# Patient Record
Sex: Female | Born: 1955
Health system: Southern US, Community
[De-identification: ages and names within clinical notes are randomized; demographics above are authoritative.]

## PROBLEM LIST (undated history)

## (undated) DIAGNOSIS — I10 Essential (primary) hypertension: Secondary | ICD-10-CM

## (undated) DIAGNOSIS — D509 Iron deficiency anemia, unspecified: Secondary | ICD-10-CM

## (undated) DIAGNOSIS — R011 Cardiac murmur, unspecified: Secondary | ICD-10-CM

## (undated) DIAGNOSIS — I517 Cardiomegaly: Secondary | ICD-10-CM

## (undated) DIAGNOSIS — Z720 Tobacco use: Secondary | ICD-10-CM

## (undated) DIAGNOSIS — N920 Excessive and frequent menstruation with regular cycle: Secondary | ICD-10-CM

## (undated) DIAGNOSIS — E669 Obesity, unspecified: Secondary | ICD-10-CM

## (undated) HISTORY — PX: TUBAL LIGATION: SHX77

## (undated) HISTORY — DX: Tobacco use: Z72.0

## (undated) HISTORY — DX: Essential (primary) hypertension: I10

## (undated) HISTORY — DX: Cardiac murmur, unspecified: R01.1

## (undated) HISTORY — PX: DENTAL SURGERY: SHX609

## (undated) HISTORY — PX: LIPOMA EXCISION: SHX5283

## (undated) HISTORY — PX: APPENDECTOMY: SHX54

## (undated) HISTORY — DX: Obesity, unspecified: E66.9

## (undated) HISTORY — DX: Iron deficiency anemia, unspecified: D50.9

## (undated) HISTORY — DX: Cardiomegaly: I51.7

## (undated) HISTORY — DX: Excessive and frequent menstruation with regular cycle: N92.0

---

## 2004-06-29 ENCOUNTER — Ambulatory Visit (HOSPITAL_COMMUNITY): Admission: RE | Admit: 2004-06-29 | Discharge: 2004-06-29 | Payer: Self-pay | Admitting: Obstetrics and Gynecology

## 2005-06-28 ENCOUNTER — Emergency Department (HOSPITAL_COMMUNITY): Admission: EM | Admit: 2005-06-28 | Discharge: 2005-06-28 | Payer: Self-pay | Admitting: Emergency Medicine

## 2007-08-29 ENCOUNTER — Ambulatory Visit: Payer: Self-pay | Admitting: Family Medicine

## 2007-08-29 DIAGNOSIS — I10 Essential (primary) hypertension: Secondary | ICD-10-CM | POA: Insufficient documentation

## 2007-08-29 DIAGNOSIS — D179 Benign lipomatous neoplasm, unspecified: Secondary | ICD-10-CM | POA: Insufficient documentation

## 2007-08-29 LAB — CONVERTED CEMR LAB
Bilirubin Urine: NEGATIVE
Glucose, Urine, Semiquant: NEGATIVE
Nitrite: NEGATIVE
Protein, U semiquant: NEGATIVE
Specific Gravity, Urine: 1.015
pH: 7.5

## 2007-08-30 ENCOUNTER — Encounter (INDEPENDENT_AMBULATORY_CARE_PROVIDER_SITE_OTHER): Payer: Self-pay | Admitting: Family Medicine

## 2007-09-03 LAB — CONVERTED CEMR LAB
ALT: 9 units/L (ref 0–35)
AST: 13 units/L (ref 0–37)
Albumin: 4.4 g/dL (ref 3.5–5.2)
Alkaline Phosphatase: 62 units/L (ref 39–117)
Basophils Relative: 0 % (ref 0–1)
CO2: 20 meq/L (ref 19–32)
Creatinine, Ser: 0.74 mg/dL (ref 0.40–1.20)
Eosinophils Absolute: 0.1 10*3/uL (ref 0.0–0.7)
HCT: 35.7 % — ABNORMAL LOW (ref 36.0–46.0)
Hemoglobin: 10.8 g/dL — ABNORMAL LOW (ref 12.0–15.0)
LDL Cholesterol: 86 mg/dL (ref 0–99)
Lymphocytes Relative: 23 % (ref 12–46)
Lymphs Abs: 2.1 10*3/uL (ref 0.7–4.0)
MCV: 77.6 fL — ABNORMAL LOW (ref 78.0–100.0)
Monocytes Absolute: 0.9 10*3/uL (ref 0.1–1.0)
Monocytes Relative: 10 % (ref 3–12)
Platelets: 377 10*3/uL (ref 150–400)
RBC: 4.6 M/uL (ref 3.87–5.11)
Total Bilirubin: 0.3 mg/dL (ref 0.3–1.2)
Total Protein: 7.7 g/dL (ref 6.0–8.3)
Triglycerides: 48 mg/dL (ref <150)

## 2007-09-12 ENCOUNTER — Ambulatory Visit: Payer: Self-pay | Admitting: Family Medicine

## 2007-09-12 DIAGNOSIS — D509 Iron deficiency anemia, unspecified: Secondary | ICD-10-CM | POA: Insufficient documentation

## 2007-09-12 DIAGNOSIS — J4520 Mild intermittent asthma, uncomplicated: Secondary | ICD-10-CM | POA: Insufficient documentation

## 2007-09-12 LAB — CONVERTED CEMR LAB
Cholesterol, target level: 200 mg/dL
HDL goal, serum: 40 mg/dL
LDL Goal: 160 mg/dL
OCCULT 1: NEGATIVE

## 2007-09-16 LAB — CONVERTED CEMR LAB
Iron: 37 ug/dL — ABNORMAL LOW (ref 42–145)
Retic Ct Pct: 0.5 % (ref 0.4–3.1)
Saturation Ratios: 9 % — ABNORMAL LOW (ref 20–55)
UIBC: 386 ug/dL

## 2007-10-10 ENCOUNTER — Ambulatory Visit: Payer: Self-pay | Admitting: Family Medicine

## 2007-10-15 ENCOUNTER — Encounter (INDEPENDENT_AMBULATORY_CARE_PROVIDER_SITE_OTHER): Payer: Self-pay | Admitting: Family Medicine

## 2007-11-21 ENCOUNTER — Encounter (INDEPENDENT_AMBULATORY_CARE_PROVIDER_SITE_OTHER): Payer: Self-pay | Admitting: Family Medicine

## 2008-02-10 ENCOUNTER — Telehealth (INDEPENDENT_AMBULATORY_CARE_PROVIDER_SITE_OTHER): Payer: Self-pay | Admitting: *Deleted

## 2008-02-10 ENCOUNTER — Emergency Department (HOSPITAL_COMMUNITY): Admission: EM | Admit: 2008-02-10 | Discharge: 2008-02-10 | Payer: Self-pay | Admitting: Emergency Medicine

## 2008-02-12 ENCOUNTER — Ambulatory Visit: Payer: Self-pay | Admitting: Family Medicine

## 2008-02-13 ENCOUNTER — Encounter (INDEPENDENT_AMBULATORY_CARE_PROVIDER_SITE_OTHER): Payer: Self-pay | Admitting: Family Medicine

## 2008-02-13 ENCOUNTER — Encounter (INDEPENDENT_AMBULATORY_CARE_PROVIDER_SITE_OTHER): Payer: Self-pay | Admitting: *Deleted

## 2008-02-13 LAB — CONVERTED CEMR LAB
BUN: 15 mg/dL (ref 6–23)
Basophils Absolute: 0.1 10*3/uL (ref 0.0–0.1)
CO2: 23 meq/L (ref 19–32)
Calcium: 9.7 mg/dL (ref 8.4–10.5)
Eosinophils Absolute: 1.1 10*3/uL — ABNORMAL HIGH (ref 0.0–0.7)
Glucose, Bld: 72 mg/dL (ref 70–99)
Hemoglobin: 12 g/dL (ref 12.0–15.0)
Lymphocytes Relative: 17 % (ref 12–46)
Lymphs Abs: 2.3 10*3/uL (ref 0.7–4.0)
MCV: 82.8 fL (ref 78.0–100.0)
Monocytes Absolute: 0.9 10*3/uL (ref 0.1–1.0)
Monocytes Relative: 7 % (ref 3–12)
Neutro Abs: 9.3 10*3/uL — ABNORMAL HIGH (ref 1.7–7.7)
Potassium: 3.6 meq/L (ref 3.5–5.3)

## 2008-02-27 ENCOUNTER — Ambulatory Visit: Payer: Self-pay | Admitting: Family Medicine

## 2008-03-02 ENCOUNTER — Encounter (INDEPENDENT_AMBULATORY_CARE_PROVIDER_SITE_OTHER): Payer: Self-pay | Admitting: Family Medicine

## 2008-04-02 ENCOUNTER — Ambulatory Visit: Payer: Self-pay | Admitting: Family Medicine

## 2008-04-02 DIAGNOSIS — K219 Gastro-esophageal reflux disease without esophagitis: Secondary | ICD-10-CM | POA: Insufficient documentation

## 2008-05-07 ENCOUNTER — Ambulatory Visit: Payer: Self-pay | Admitting: Family Medicine

## 2008-05-08 ENCOUNTER — Encounter (INDEPENDENT_AMBULATORY_CARE_PROVIDER_SITE_OTHER): Payer: Self-pay | Admitting: Family Medicine

## 2008-05-10 LAB — CONVERTED CEMR LAB
BUN: 16 mg/dL (ref 6–23)
Basophils Absolute: 0 10*3/uL (ref 0.0–0.1)
Basophils Relative: 0 % (ref 0–1)
Creatinine, Ser: 0.77 mg/dL (ref 0.40–1.20)
Eosinophils Relative: 3 % (ref 0–5)
HCT: 38.8 % (ref 36.0–46.0)
Lymphs Abs: 1.9 10*3/uL (ref 0.7–4.0)
MCHC: 33 g/dL (ref 30.0–36.0)
Monocytes Relative: 11 % (ref 3–12)
Platelets: 341 10*3/uL (ref 150–400)
WBC: 7.1 10*3/uL (ref 4.0–10.5)

## 2008-06-04 ENCOUNTER — Encounter (INDEPENDENT_AMBULATORY_CARE_PROVIDER_SITE_OTHER): Payer: Self-pay | Admitting: Family Medicine

## 2008-06-04 ENCOUNTER — Ambulatory Visit: Payer: Self-pay | Admitting: Family Medicine

## 2008-06-04 ENCOUNTER — Other Ambulatory Visit: Admission: RE | Admit: 2008-06-04 | Discharge: 2008-06-04 | Payer: Self-pay | Admitting: Family Medicine

## 2008-06-04 DIAGNOSIS — F17200 Nicotine dependence, unspecified, uncomplicated: Secondary | ICD-10-CM | POA: Insufficient documentation

## 2008-06-07 ENCOUNTER — Telehealth (INDEPENDENT_AMBULATORY_CARE_PROVIDER_SITE_OTHER): Payer: Self-pay | Admitting: *Deleted

## 2008-06-08 ENCOUNTER — Encounter (INDEPENDENT_AMBULATORY_CARE_PROVIDER_SITE_OTHER): Payer: Self-pay | Admitting: Family Medicine

## 2008-06-08 ENCOUNTER — Encounter (INDEPENDENT_AMBULATORY_CARE_PROVIDER_SITE_OTHER): Payer: Self-pay | Admitting: *Deleted

## 2008-06-17 ENCOUNTER — Ambulatory Visit (HOSPITAL_COMMUNITY): Admission: RE | Admit: 2008-06-17 | Discharge: 2008-06-17 | Payer: Self-pay | Admitting: Family Medicine

## 2008-07-07 ENCOUNTER — Encounter (INDEPENDENT_AMBULATORY_CARE_PROVIDER_SITE_OTHER): Payer: Self-pay | Admitting: Family Medicine

## 2008-07-07 ENCOUNTER — Encounter (INDEPENDENT_AMBULATORY_CARE_PROVIDER_SITE_OTHER): Payer: Self-pay | Admitting: *Deleted

## 2008-07-19 ENCOUNTER — Encounter (INDEPENDENT_AMBULATORY_CARE_PROVIDER_SITE_OTHER): Payer: Self-pay | Admitting: Family Medicine

## 2008-07-30 ENCOUNTER — Ambulatory Visit: Payer: Self-pay | Admitting: Family Medicine

## 2008-09-01 ENCOUNTER — Ambulatory Visit: Payer: Self-pay | Admitting: Family Medicine

## 2008-12-06 ENCOUNTER — Ambulatory Visit: Payer: Self-pay | Admitting: Family Medicine

## 2008-12-07 ENCOUNTER — Encounter (INDEPENDENT_AMBULATORY_CARE_PROVIDER_SITE_OTHER): Payer: Self-pay | Admitting: Family Medicine

## 2008-12-08 LAB — CONVERTED CEMR LAB
Basophils Relative: 0 % (ref 0–1)
HCT: 36.3 % (ref 36.0–46.0)
MCV: 89.9 fL (ref 78.0–100.0)
Neutrophils Relative %: 65 % (ref 43–77)
Platelets: 339 10*3/uL (ref 150–400)
Potassium: 4.7 meq/L (ref 3.5–5.3)
RBC: 4.04 M/uL (ref 3.87–5.11)
RDW: 13.4 % (ref 11.5–15.5)
Sodium: 141 meq/L (ref 135–145)
WBC: 7.6 10*3/uL (ref 4.0–10.5)

## 2009-05-24 ENCOUNTER — Encounter: Payer: Self-pay | Admitting: Physician Assistant

## 2009-05-24 ENCOUNTER — Ambulatory Visit: Payer: Self-pay | Admitting: Family Medicine

## 2009-05-25 ENCOUNTER — Encounter: Payer: Self-pay | Admitting: Physician Assistant

## 2009-05-30 LAB — CONVERTED CEMR LAB
ALT: 10 units/L (ref 0–35)
AST: 11 units/L (ref 0–37)
Alkaline Phosphatase: 53 units/L (ref 39–117)
BUN: 10 mg/dL (ref 6–23)
Creatinine, Ser: 0.6 mg/dL (ref 0.40–1.20)
Glucose, Bld: 87 mg/dL (ref 70–99)
HCT: 36.2 % (ref 36.0–46.0)
Hemoglobin: 11.1 g/dL — ABNORMAL LOW (ref 12.0–15.0)
LDL Cholesterol: 97 mg/dL (ref 0–99)
MCHC: 30.7 g/dL (ref 30.0–36.0)
Potassium: 4.4 meq/L (ref 3.5–5.3)
RBC: 4.12 M/uL (ref 3.87–5.11)
Saturation Ratios: 6 % — ABNORMAL LOW (ref 20–55)
Total Bilirubin: 0.2 mg/dL — ABNORMAL LOW (ref 0.3–1.2)
Triglycerides: 46 mg/dL (ref <150)
Vit D, 25-Hydroxy: 12 ng/mL — ABNORMAL LOW (ref 30–89)
Vitamin B-12: 326 pg/mL (ref 211–911)
WBC: 7.7 10*3/uL (ref 4.0–10.5)

## 2009-05-31 ENCOUNTER — Ambulatory Visit: Payer: Self-pay | Admitting: Family Medicine

## 2009-05-31 DIAGNOSIS — R011 Cardiac murmur, unspecified: Secondary | ICD-10-CM | POA: Insufficient documentation

## 2009-05-31 DIAGNOSIS — N920 Excessive and frequent menstruation with regular cycle: Secondary | ICD-10-CM | POA: Insufficient documentation

## 2009-06-10 ENCOUNTER — Encounter: Payer: Self-pay | Admitting: Family Medicine

## 2009-06-10 ENCOUNTER — Ambulatory Visit (HOSPITAL_COMMUNITY): Admission: RE | Admit: 2009-06-10 | Discharge: 2009-06-10 | Payer: Self-pay | Admitting: Family Medicine

## 2009-06-20 ENCOUNTER — Encounter: Payer: Self-pay | Admitting: Gastroenterology

## 2009-06-20 ENCOUNTER — Ambulatory Visit (HOSPITAL_COMMUNITY): Admission: RE | Admit: 2009-06-20 | Discharge: 2009-06-20 | Payer: Self-pay | Admitting: Family Medicine

## 2009-06-30 ENCOUNTER — Ambulatory Visit: Payer: Self-pay | Admitting: Family Medicine

## 2009-06-30 ENCOUNTER — Other Ambulatory Visit: Admission: RE | Admit: 2009-06-30 | Discharge: 2009-06-30 | Payer: Self-pay | Admitting: Family Medicine

## 2009-06-30 DIAGNOSIS — E559 Vitamin D deficiency, unspecified: Secondary | ICD-10-CM | POA: Insufficient documentation

## 2009-06-30 LAB — FECAL OCCULT BLOOD, GUAIAC: Fecal Occult Blood: NEGATIVE

## 2009-07-04 ENCOUNTER — Ambulatory Visit: Payer: Self-pay | Admitting: Gastroenterology

## 2009-07-04 ENCOUNTER — Ambulatory Visit (HOSPITAL_COMMUNITY): Admission: RE | Admit: 2009-07-04 | Discharge: 2009-07-04 | Payer: Self-pay | Admitting: Gastroenterology

## 2009-07-06 ENCOUNTER — Encounter: Payer: Self-pay | Admitting: Physician Assistant

## 2009-07-06 LAB — CONVERTED CEMR LAB

## 2009-07-22 ENCOUNTER — Ambulatory Visit (HOSPITAL_COMMUNITY): Admission: RE | Admit: 2009-07-22 | Discharge: 2009-07-22 | Payer: Self-pay | Admitting: General Surgery

## 2009-10-06 ENCOUNTER — Ambulatory Visit: Payer: Self-pay | Admitting: Family Medicine

## 2009-10-06 LAB — CONVERTED CEMR LAB: BUN: 19 mg/dL (ref 6–23)

## 2010-03-06 ENCOUNTER — Ambulatory Visit: Payer: Self-pay | Admitting: Family Medicine

## 2010-03-07 ENCOUNTER — Ambulatory Visit: Payer: Self-pay | Admitting: Family Medicine

## 2010-04-03 ENCOUNTER — Encounter: Payer: Self-pay | Admitting: Family Medicine

## 2010-04-16 ENCOUNTER — Encounter: Payer: Self-pay | Admitting: Obstetrics and Gynecology

## 2010-04-25 NOTE — Assessment & Plan Note (Signed)
Summary: new patient- room 2   Vital Signs:  Patient profile:   55 year old female Height:      62 inches Weight:      170.75 pounds BMI:     31.34 O2 Sat:      96 % on Room air Pulse rate:   90 / minute Resp:     16 per minute BP sitting:   240 / 140  (left arm)  Vitals Entered By: Adella Hare LPN (May 24, 1608 8:50 AM) CC: new patient Is Patient Diabetic? No Pain Assessment Patient in pain? no        Primary Provider:  Franchot Heidelberg, MD  CC:  new patient.  History of Present Illness: New pt here to establish new PCP.  Has been out of medications x 3 mos. States that she has been feeling well.  No complaints or concers.  Knows that she should quit smoking.  Pts blood pressure is very high today. She states that even when she was on medication it was never well controlled.  In reviewing her prev records appears that she was not compliant with her medication.  She does not check her BP at home.  Denies HA, dizziness, or chest pain.  She also has a hx of asthma.  States she had a little wheeze in her chest when she awoke this am.  Lasted a few mins then went away.  Did not use her inhaler.  States that she rarely has wheezing or difficulty breathing.    Pt also has a hx of GERD.  States that this is not bothering her.  She has some Zantac at home but hasn't been taking it because she doesn't have any syptoms.  Also has a hx of anemia.  Is not taking her iron.   Asthma History    Asthma Control Assessment:    Age range: 12+ years    Symptoms: 0-2 days/week    Nighttime Awakenings: 0-2/month    Interferes w/ normal activity: no limitations    SABA use (not for EIB): 0-2 days/week    FEV1: 2.17 liters (today)    Exacerbations requiring oral systemic steroids: 0-1/year    Asthma Control Assessment: Well Controlled   Current Medications (verified): 1)  None  Allergies (verified): No Known Drug Allergies  Past History:  Past medical, surgical, family and  social histories (including risk factors) reviewed, and no changes noted (except as noted below).  Past Medical History: Reviewed history from 10/10/2007 and no changes required. Current Problems:  UNSPECIFIED ANEMIA - MICROCYTIC (ICD-285.9) ANEMIA, NORMOCYTIC (ICD-285.9) SHORTNESS OF BREATH (ICD-786.05) TOBACCO USER (ICD-305.1) LIPOMA (ICD-214.9) OBESITY (ICD-278.00) HYPERTENSION (ICD-401.9)  Past Surgical History: Reviewed history from 07/30/2008 and no changes required. 1. Appendectomy - late 110s.  2. WisdomTeeth early 30s. 3. Tubal ligation age 67  Family History: Reviewed history from 07/30/2008 and no changes required. Father: 30, healthy Mother: 52, kidney dz Brothers x 2: Oldest 33, younger 51 - HTN in oldest 3 children: Boys x 2 30,32, and girl age 74 healthy  Social History: Reviewed history from 07/30/2008 and no changes required. 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 Eyes:  Denies blurring and double vision. ENT:  Denies earache, sinus pressure, and sore throat. CV:  Denies chest pain or discomfort, difficulty breathing at night, palpitations, shortness of breath with exertion, and swelling of  feet. Resp:  Complains of wheezing; denies cough and shortness of breath. GI:  Denies indigestion, nausea, and vomiting. Neuro:  Denies headaches, numbness, tingling, tremors, and weakness. Heme:  Denies enlarge lymph nodes.  Physical Exam  General:  Well-developed,well-nourished,in no acute distress; alert,appropriate and cooperative throughout examination Head:  Normocephalic and atraumatic without obvious abnormalities. No apparent alopecia or balding. Eyes:  No corneal or conjunctival inflammation noted. EOMI. Perrla. Funduscopic exam benign, without hemorrhages, exudates or papilledema. Vision  grossly normal. Ears:  External ear exam shows no significant lesions or deformities.  Otoscopic examination reveals clear canals, tympanic membranes are intact bilaterally without bulging, retraction, inflammation or discharge. Hearing is grossly normal bilaterally. Nose:  External nasal examination shows no deformity or inflammation. Nasal mucosa are pink and moist without lesions or exudates. Mouth:  Oral mucosa and oropharynx without lesions or exudates.   Neck:  No deformities, masses, or tenderness noted. Lungs:  Normal respiratory effort, chest expands symmetrically. Lungs are clear to auscultation, no crackles or wheezes. Heart:  Normal rate and regular rhythm. S1 and S2 normal without gallop, murmur, click, rub or other extra sounds. Abdomen:  Bowel sounds positive,abdomen soft and non-tender without masses, organomegaly or hernias noted. Cervical Nodes:  No lymphadenopathy noted Psych:  Cognition and judgment appear intact. Alert and cooperative with normal attention span and concentration. No apparent delusions, illusions, hallucinations   Impression & Recommendations:  Problem # 1:  HYPERTENSION (ICD-401.9) Assessment Deteriorated BP is very high today, & was confirmed by myself. Pt though is asymptomatic, has a hx of elevated BP & medication noncompliance.  The following medications were removed from the medication list:    Lisinopril-hydrochlorothiazide 20-12.5 Mg Tabs (Lisinopril-hydrochlorothiazide) ..... One two times a day    Amlodipine Besylate 10 Mg Tabs (Amlodipine besylate) ..... One daily Her updated medication list for this problem includes:    Lisinopril-hydrochlorothiazide 20-25 Mg Tabs (Lisinopril-hydrochlorothiazide) .Marland Kitchen... 1 q am for high blood pressure    Amlodipine Besylate 10 Mg Tabs (Amlodipine besylate) .Marland Kitchen... 1 daily for high blood pressure  Orders: T-Comprehensive Metabolic Panel [id number]) T-Lipid Profile (09811-91478) T-TSH (29562-13086) EKG w/  Interpretation (93000) - Nl EKG.  See scan.  Problem # 2:  ASTHMA (ICD-493.90) Assessment: Improved  The following medications were removed from the medication list:    Ventolin Hfa 108 (90 Base) Mcg/act Aers (Albuterol sulfate) .Marland Kitchen... 2 puffs every 4 to 6 hours as needed Her updated medication list for this problem includes:    Ventolin Hfa 108 (90 Base) Mcg/act Aers (Albuterol sulfate) .Marland Kitchen... 2 puffs q 4 hrs prn  Problem # 3:  ANEMIA, IRON DEFICIENCY (ICD-280.9)  Orders: T-CBC No Diff ([id number]) T-Iron (28413-24401) T-Iron Binding Capacity (TIBC) ([address]) T-Ferritin (40347-42595) T-Vitamin B12 (63875-64332)  Problem # 4:  TOBACCO USER (ICD-305.1)  Encouraged smoking cessation.   Problem # 5:  GERD (ICD-530.81) Assessment: Improved  The following medications were removed from the medication list:    Zantac 150 Mg Tabs (Ranitidine hcl) ..... One two times a day  Complete Medication List: 1)  Lisinopril-hydrochlorothiazide 20-25 Mg Tabs (Lisinopril-hydrochlorothiazide) .Marland Kitchen.. 1 q am for high blood pressure 2)  Amlodipine Besylate 10 Mg Tabs (Amlodipine besylate) .Marland Kitchen.. 1 daily for high blood pressure 3)  Ventolin Hfa 108 (90 Base) Mcg/act Aers (Albuterol sulfate) .... 2 puffs q 4 hrs prn  Other Orders: T-Vitamin D 25-Hydroxy & 1,25 Dihydroxy (9518)  Patient Instructions: 1)  Follow up appt in one week. 2)  Have lab work drawn  today. 3)  Tobacco is  very bad for your health and your loved ones! You Should stop smoking!. 4)  Start your medications.  Make sure you take your blood pressure medication every day. Prescriptions: VENTOLIN HFA 108 (90 BASE) MCG/ACT AERS (ALBUTEROL SULFATE) 2 puffs q 4 hrs prn  #1 x 1   Entered and Authorized by:   Esperanza Sheets PA   Signed by:   Esperanza Sheets PA on 05/24/2009   Method used:   Electronically to        [facility] [address] [phone]* (retail)       [address] [place], [place]  [address]       Ph: [phone]       Fax:  [phone]   RxID:   [id number] AMLODIPINE BESYLATE 10 MG TABS (AMLODIPINE BESYLATE) 1 daily for high blood pressure  #30 x 1   Entered and Authorized by:   Esperanza Sheets PA   Signed by:   Esperanza Sheets PA on 05/24/2009   Method used:   Electronically to        [facility] [phone]* (retail)       [address] [place], [place]  [address]       Ph: [phone]       Fax: [phone]   RxID:   [id number] LISINOPRIL-HYDROCHLOROTHIAZIDE 20-25 MG TABS (LISINOPRIL-HYDROCHLOROTHIAZIDE) 1 q am for high blood pressure  #30 x 1   Entered and Authorized by:   Esperanza Sheets PA   Signed by:   Esperanza Sheets PA on 05/24/2009   Method used:   Electronically to        [facility] [phone]* (retail)       [address], [place]  [address]       Ph: [phone]       Fax: [phone]   RxID:   [id number]

## 2010-04-25 NOTE — Assessment & Plan Note (Signed)
Summary: follow up--room 1   Vital Signs:  Patient profile:   55 year old female Menstrual status:  regular LMP:     05/30/2009 Height:      62 inches Weight:      165.25 pounds BMI:     30.33 O2 Sat:      97 % Pulse rate:   83 / minute Resp:     16 per minute BP sitting:   144 / 98  (left arm) Cuff size:   regular  Vitals Entered By: Everitt Amber LPN (June 01, 5782 8:10 AM)  Nutrition Counseling: Patient's BMI is greater than 25 and therefore counseled on weight management options.  Serial Vital Signs/Assessments:  Time      Position  BP       Pulse  Resp  Temp     By                     150/92                         Esperanza Sheets PA  CC: Follow up visit LMP (date): 05/30/2009     Menstrual Status regular Enter LMP: 05/30/2009 Last PAP Result NEGATIVE FOR INTRAEPITHELIAL LESIONS OR MALIGNANCY.   Primary Provider:  Esperanza Sheets PA  CC:  Follow up visit.  History of Present Illness: Pt is here today for follow up.  She was seen 1 wk ago with very high BP.  She started her meds & states she is doing well.  No complaints/concerns.  No side effects from meds.  No chest pain, palp, or swelling.  Pt has a hx of asthma.  Had 1 episode of wheezing in the past wk.  Uses albuterol mdi as needed.  Still smoking.  Also has a hx of anemia.  Recent lab work also showed anemia. Pt restarted her iron yest.  Her menses are still regular.  States the 3rd day is heavy.  Wears a pad & tampon & has to change approx q hr.  No previous colonoscopy.  Her last pap/pelvic exam was 1 yr ago.  Pt was also recently rxd Vitamin D.  She will pick up this prescription this week when she gets pd.  Current Medications (verified): 1)  Lisinopril-Hydrochlorothiazide 20-25 Mg Tabs (Lisinopril-Hydrochlorothiazide) .Marland Kitchen.. 1 Q Am For High Blood Pressure 2)  Amlodipine Besylate 10 Mg Tabs (Amlodipine Besylate) .Marland Kitchen.. 1 Daily For High Blood Pressure 3)  Ventolin Hfa 108 (90 Base) Mcg/act Aers (Albuterol  Sulfate) .... 2 Puffs Q 4 Hrs Prn 4)  Vitamin D (Ergocalciferol) 50000 Unit Caps (Ergocalciferol) .... One Tab By Mouth Every Week  Allergies (verified): No Known Drug Allergies  Past History:  Past medical, surgical, family and social histories (including risk factors) reviewed for relevance to current acute and chronic problems.  Past Medical History: Current Problems:  TOBACCO USER (ICD-305.1) LIPOMA (ICD-214.9) OBESITY (ICD-278.00) HYPERTENSION (ICD-401.9) Anemia-iron deficiency Asthma Vitamin D Deficiency. Systolic heart Murmur  Past Surgical History: Reviewed history from 07/30/2008 and no changes required. 1. Appendectomy - late 81s.  2. WisdomTeeth early 30s. 3. Tubal ligation age 59  Family History: Reviewed history from 07/30/2008 and no changes required. Father: 77, healthy Mother: 52, kidney dz Brothers x 2: Oldest 79, younger 20 - HTN in oldest 3 children: Boys x 2 30,32, and girl age 3 healthy  Social History: Reviewed history from 07/30/2008 and no changes required. 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 CV:  Denies chest pain or discomfort, palpitations, and swelling of feet. Resp:  Denies cough and shortness of breath. GI:  Denies bloody stools, change in bowel habits, and dark tarry stools.  Physical Exam  General:  Well-developed,well-nourished,in no acute distress; alert,appropriate and cooperative throughout examination Head:  Normocephalic and atraumatic without obvious abnormalities. No apparent alopecia or balding. Ears:  External ear exam shows no significant lesions or deformities.  Otoscopic examination reveals clear canals, tympanic membranes are intact bilaterally without bulging, retraction, inflammation or discharge. Hearing is grossly normal bilaterally. Nose:   External nasal examination shows no deformity or inflammation. Nasal mucosa are pink and moist without lesions or exudates. Mouth:  Oral mucosa and oropharynx without lesions or exudates.  Teeth in good repair - upper plate. Neck:  No deformities, masses, or tenderness noted. Lungs:  Normal respiratory effort, chest expands symmetrically. Lungs are clear to auscultation, no crackles or wheezes. Heart:  normal rate, regular rhythm, and grade 2 /6 systolic murmur.   Cervical Nodes:  No lymphadenopathy noted Psych:  Cognition and judgment appear intact. Alert and cooperative with normal attention span and concentration. No apparent delusions, illusions, hallucinations   Impression & Recommendations:  Problem # 1:  HYPERTENSION (ICD-401.9) Assessment Improved Pt started prescription 1 wk ago.  Will continue at current dose & recheck again at next visit.  Her updated medication list for this problem includes:    Lisinopril-hydrochlorothiazide 20-25 Mg Tabs (Lisinopril-hydrochlorothiazide) .Marland Kitchen... 1 q am for high blood pressure    Amlodipine Besylate 10 Mg Tabs (Amlodipine besylate) .Marland Kitchen... 1 daily for high blood pressure  Orders: 2 D Echo (2 D Echo)  Problem # 2:  HEART MURMUR, SYSTOLIC (ICD-785.2) Assessment: New  Orders: 2 D Echo (2 D Echo)  Problem # 3:  ANEMIA, IRON DEFICIENCY (ICD-280.9) Assessment: Unchanged  Orders: Gastroenterology Referral (GI)  Problem # 4:  MENORRHAGIA (ICD-626.2) Assessment: Unchanged  Orders: Pelvic Ultrasound (Sono Pelvis)  Problem # 5:  TOBACCO USER (ICD-305.1) Assessment: Unchanged  Encouraged smoking cessation    Complete Medication List: 1)  Lisinopril-hydrochlorothiazide 20-25 Mg Tabs (Lisinopril-hydrochlorothiazide) .Marland Kitchen.. 1 q am for high blood pressure 2)  Amlodipine Besylate 10 Mg Tabs (Amlodipine besylate) .Marland Kitchen.. 1 daily for high blood pressure 3)  Ventolin Hfa 108 (90 Base) Mcg/act Aers (Albuterol sulfate) .... 2 puffs q 4 hrs prn 4)   Vitamin D (ergocalciferol) 50000 Unit Caps (Ergocalciferol) .... One tab by mouth every week  Other Orders: Mammogram (Screening) (Mammo)  Patient Instructions: 1)  Please schedule a follow-up appointment in 1 month for pap/breast exam. 2)  Tobacco is very bad for your health and your loved ones! You Should stop smoking!. 3)  Stop Smoking Tips: Choose a Quit date. Cut down before the Quit date. decide what you will do as a substitute when you feel the urge to smoke(gum,toothpick,exercise). 4)  It is important that you exercise regularly at least 20 minutes 5 times a week. If you develop chest pain, have severe difficulty breathing, or feel very tired , stop exercising immediately and seek medical attention. 5)  You need to lose weight. Consider a lower calorie diet and regular exercise.

## 2010-04-25 NOTE — Letter (Signed)
Summary: Laboratory/X-Ray Results  [facility]  [address]   [place], [place] [address]   Phone: [phone]  Fax: [phone]    Lab/X-Ray Results  July 06, 2009  MRN: [id number]  Paula Best [address] [place], [place]  [address]    The results of your recent lab/x-ray has been reviewed and were found:       Your pap smear is normal.      Physical is due in 1 year.  We will see you for follow up in 3 months though as planned.   If you have any questions, please contact our office.     Esperanza Sheets PA

## 2010-04-25 NOTE — Assessment & Plan Note (Signed)
Summary: office visit ROOM-2   Vital Signs:  Patient profile:   55 year old female Menstrual status:  regular Height:      62 inches Weight:      166.25 pounds BMI:     30.52 O2 Sat:      98 % Pulse rate:   76 / minute Resp:     16 per minute BP sitting:   132 / 90  (left arm) Cuff size:   regular  Vitals Entered By: Everitt Amber LPN (October 06, 2009 9:54 AM)  Serial Vital Signs/Assessments:  Time      Position  BP       Pulse  Resp  Temp     By                     124/70                         Esperanza Sheets PA  CC: Follow up, HTN   Primary Provider:  Esperanza Sheets PA  CC:  Follow up and HTN.  History of Present Illness: Pt is here today for a check up. Hx of htn. Taking medications as prescribed. No headache, chest pain or palpitations.  Also has a hx of asthma.  States the only time she has problems with her breathing is when there is smoke at work.  No HS awakening.  No difficulty breathing with exertion or at home.  No cough.  Pt states she did lose her last inhaler though and hasnt picked up a refill.  Pt is still smoking.  She is not interested in quitting at this time. Hx of anemia.  She has not started taking her iron pills as advised.    Current Medications (verified): 1)  Amlodipine Besylate 10 Mg Tabs (Amlodipine Besylate) .Marland Kitchen.. 1 Daily For High Blood Pressure 2)  Ventolin Hfa 108 (90 Base) Mcg/act Aers (Albuterol Sulfate) .... 2 Puffs Q 4 Hrs Prn 3)  Lisinopril-Hydrochlorothiazide 20-12.5 Mg Tabs (Lisinopril-Hydrochlorothiazide) .... Take 2 Tabs Every Morning For Blood Pressure  Allergies (verified): 1)  ! * Vitamin D 50,000 Units  Past History:  Past medical history reviewed for relevance to current acute and chronic problems.  Past Medical History: Reviewed history from 06/30/2009 and no changes required. Current Problems:  TOBACCO USER (ICD-305.1) LIPOMA (ICD-214.9) OBESITY (ICD-278.00) HYPERTENSION (ICD-401.9) Anemia-iron  deficiency Asthma Vitamin D Deficiency. Systolic heart Murmur Mild LVH - Echo 3/11 Menorrhagia  Review of Systems General:  Denies chills and fever. ENT:  Denies earache, nasal congestion, and sore throat. CV:  Denies chest pain or discomfort, lightheadness, and palpitations. Resp:  Denies cough and shortness of breath. Neuro:  Denies headaches.  Physical Exam  General:  Well-developed,well-nourished,in no acute distress; alert,appropriate and cooperative throughout examination Head:  Normocephalic and atraumatic without obvious abnormalities. No apparent alopecia or balding. Ears:  External ear exam shows no significant lesions or deformities.  Otoscopic examination reveals clear canals, tympanic membranes are intact bilaterally without bulging, retraction, inflammation or discharge. Hearing is grossly normal bilaterally. Nose:  External nasal examination shows no deformity or inflammation. Nasal mucosa are pink and moist without lesions or exudates. Mouth:  Oral mucosa and oropharynx without lesions or exudates.   Neck:  No deformities, masses, or tenderness noted. Lungs:  Normal respiratory effort, chest expands symmetrically. Lungs are clear to auscultation, no crackles or wheezes. Heart:  Normal rate and regular rhythm. S1 and S2 normal without gallop, murmur,  click, rub or other extra sounds. Cervical Nodes:  No lymphadenopathy noted Psych:  Cognition and judgment appear intact. Alert and cooperative with normal attention span and concentration. No apparent delusions, illusions, hallucinations   Impression & Recommendations:  Problem # 1:  HYPERTENSION (ICD-401.9) Assessment Improved  Her updated medication list for this problem includes:    Amlodipine Besylate 10 Mg Tabs (Amlodipine besylate) .Marland Kitchen... 1 daily for high blood pressure    Lisinopril-hydrochlorothiazide 20-12.5 Mg Tabs (Lisinopril-hydrochlorothiazide) .Marland Kitchen... Take 2 tabs every morning for blood  pressure  Orders: T-Basic Metabolic Panel [id number]  BP today: 132/90 Prior BP: 150/90 (06/30/2009)  Prior 10 Yr Risk Heart Disease: 8 % (12/06/2008)  Labs Reviewed: K+: 4.4 (05/24/2009) Creat: : 0.60 (05/24/2009)   Chol: 172 (05/24/2009)   HDL: 66 (05/24/2009)   LDL: 97 (05/24/2009)   TG: 46 (05/24/2009)  Problem # 2:  ASTHMA (ICD-493.90) Assessment: Improved  Her updated medication list for this problem includes:    Ventolin Hfa 108 (90 Base) Mcg/act Aers (Albuterol sulfate) .Marland Kitchen... 2 puffs q 4 hrs prn  Problem # 3:  ANEMIA-IRON DEFICIENCY (ICD-280.9) Assessment: Unchanged Pt encouraged to take her iron. Will need f/u labs in future after taking iron regularly.  Problem # 4:  TOBACCO USER (ICD-305.1) Assessment: Unchanged  Encouraged smoking cessation and discussed different methods for smoking cessation.   Complete Medication List: 1)  Amlodipine Besylate 10 Mg Tabs (Amlodipine besylate) .Marland Kitchen.. 1 daily for high blood pressure 2)  Ventolin Hfa 108 (90 Base) Mcg/act Aers (Albuterol sulfate) .... 2 puffs q 4 hrs prn 3)  Lisinopril-hydrochlorothiazide 20-12.5 Mg Tabs (Lisinopril-hydrochlorothiazide) .... Take 2 tabs every morning for blood pressure  Patient Instructions: 1)  Please schedule a follow-up appointment in 3 months. 2)  Continue your current medications. 3)  I have ordered blood work for you to have drawn. 4)  Tobacco is very bad for your health and your loved ones! You Should stop smoking!. 5)  Stop Smoking Tips: Choose a Quit date. Cut down before the Quit date. decide what you will do as a substitute when you feel the urge to smoke(gum,toothpick,exercise). Prescriptions: VENTOLIN HFA 108 (90 BASE) MCG/ACT AERS (ALBUTEROL SULFATE) 2 puffs q 4 hrs prn  #1 x 2   Entered and Authorized by:   Esperanza Sheets PA   Signed by:   Esperanza Sheets PA on 10/06/2009   Method used:   Electronically to        [facility] [address] [phone]* (retail)       [address] [place], [place]  [address]       Ph: [phone]       Fax: [phone]   RxID:   [id number] LISINOPRIL-HYDROCHLOROTHIAZIDE 20-12.5 MG TABS (LISINOPRIL-HYDROCHLOROTHIAZIDE) take 2 tabs every morning for blood pressure  #60 x 3   Entered and Authorized by:   Esperanza Sheets PA   Signed by:   Esperanza Sheets PA on 10/06/2009   Method used:   Electronically to        [facility] [phone]* (retail)       [address] [place], [place]  [address]       Ph: [phone]       Fax: [phone]   RxID:   [id number] AMLODIPINE BESYLATE 10 MG TABS (AMLODIPINE BESYLATE) 1 daily for high blood pressure  #30 Each x 3   Entered and Authorized by:   Esperanza Sheets PA  Signed by:   Esperanza Sheets PA on 10/06/2009   Method used:   Electronically to        [facility] [phone]* (retail)       [address], [place]  [address]       Ph: [phone]       Fax: [phone]   RxID:   [id number]

## 2010-04-25 NOTE — Letter (Signed)
Summary: Internal Other/Triage  Internal Other/Triage   Imported By: Cloria Spring LPN 95/62/1308 65:78:46  _____________________________________________________________________  External Attachment:    Type:   Image     Comment:   External Document

## 2010-04-25 NOTE — Assessment & Plan Note (Signed)
Summary: physical - room 3   Vital Signs:  Patient profile:   55 year old female Menstrual status:  regular Height:      62 inches Weight:      165 pounds BMI:     30.29 O2 Sat:      98 % on Room air Pulse rate:   89 / minute Resp:     16 per minute BP sitting:   150 / 90  (left arm)  Vitals Entered By: Adella Hare LPN (June 30, 1608 10:18 AM)  Nutrition Counseling: Patient's BMI is greater than 25 and therefore counseled on weight management options. CC: physical Is Patient Diabetic? No Pain Assessment Patient in pain? no        Primary Provider:  Esperanza Sheets PA  CC:  physical.  History of Present Illness: Pt is here today for her physical. Mammogram UTD - nl Menses regular.  Usually heavy, but hasn't been this mos.  Heavy, with clots, first 3 days. Has to change protection (pad & Tampon) sometimes less than 1 hr. Colonoscopy schedulled for next wk. No prev eye exam. + Dental care Uncertain Tetnus.  Pneumovax UTD.  Doesnt take iron regularly. Takes her BP meds daily. Hasn't picked up Vit D prescription yet.  Discussed recent test results, including mamm, pelvic US, & echo.  Explained LVH to pt & importance of BP control to prevent worsening.   Asthma History    Initial Asthma Severity Rating:    Age range: 12+ years    Symptoms: 0-2 days/week    Nighttime Awakenings: 0-2/month    Interferes w/ normal activity: no limitations    SABA use (not for EIB): 0-2 days/week    Exacerbations requiring oral systemic steroids: 0-1/year    Asthma Severity Assessment: Intermittent   Current Medications (verified): 1)  Lisinopril-Hydrochlorothiazide 20-25 Mg Tabs (Lisinopril-Hydrochlorothiazide) .Marland Kitchen.. 1 Q Am For High Blood Pressure 2)  Amlodipine Besylate 10 Mg Tabs (Amlodipine Besylate) .Marland Kitchen.. 1 Daily For High Blood Pressure 3)  Ventolin Hfa 108 (90 Base) Mcg/act Aers (Albuterol Sulfate) .... 2 Puffs Q 4 Hrs Prn 4)  Vitamin D (Ergocalciferol) 50000 Unit Caps  (Ergocalciferol) .... One Tab By Mouth Every Week  Allergies (verified): No Known Drug Allergies  Past History:  Past medical, surgical, family and social histories (including risk factors) reviewed, and no changes noted (except as noted below).  Past Medical History: Current Problems:  TOBACCO USER (ICD-305.1) LIPOMA (ICD-214.9) OBESITY (ICD-278.00) HYPERTENSION (ICD-401.9) Anemia-iron deficiency Asthma Vitamin D Deficiency. Systolic heart Murmur Mild LVH - Echo 3/11 Menorrhagia  Past Surgical History: Reviewed history from 07/30/2008 and no changes required. 1. Appendectomy - late 25s.  2. WisdomTeeth early 30s. 3. Tubal ligation age 82  Family History: Reviewed history from 07/30/2008 and no changes required. Father: 63, healthy Mother: 26, kidney dz Brothers x 2: Oldest 11, younger 78 - HTN in oldest 3 children: Boys x 2 30,32, and girl age 61 healthy  Social History: Reviewed history from 07/30/2008 and no changes required. 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 Eyes:  Complains of blurring and double vision. ENT:  Denies decreased hearing, earache, nasal congestion, ringing in ears, and sore throat. CV:  Denies chest pain or discomfort and palpitations. Resp:  Complains of wheezing; denies cough and shortness of breath. GI:  Complains of indigestion; denies abdominal pain, bloody stools, change in bowel  habits, constipation, diarrhea, nausea, and vomiting; INDIGESTION WITH CERTAIN FOODS ONLY. GU:  Denies abnormal vaginal bleeding, dysuria, incontinence, and urinary frequency. Derm:  Complains of lesion(s); denies rash. Psych:  Denies anxiety and depression. Allergy:  Denies seasonal allergies.  Physical Exam  General:  Well-developed,well-nourished,in no acute distress; alert,appropriate and  cooperative throughout examination Head:  Normocephalic and atraumatic without obvious abnormalities. No apparent alopecia or balding. Eyes:  No corneal or conjunctival inflammation noted. EOMI. Perrla. Funduscopic exam benign, without hemorrhages, exudates or papilledema. Vision grossly normal. Ears:  External ear exam shows no significant lesions or deformities.  Otoscopic examination reveals clear canals, tympanic membranes are intact bilaterally without bulging, retraction, inflammation or discharge. Hearing is grossly normal bilaterally. Nose:  External nasal examination shows no deformity or inflammation. Nasal mucosa are pink and moist without lesions or exudates. Mouth:  Oral mucosa and oropharynx without lesions or exudates.  Teeth in good repair. Neck:  No deformities, masses, or tenderness noted.no thyromegaly.   Chest Wall:  no deformities and no mass.   Breasts:  No mass, nodules, thickening, tenderness, bulging, retraction, inflamation, nipple discharge or skin changes noted.   Lungs:  Normal respiratory effort, chest expands symmetrically. Lungs are clear to auscultation, no crackles or wheezes. Heart:  Normal rate and regular rhythm. S1 and S2 normal without gallop, murmur, click, rub or other extra sounds. Abdomen:  soft, non-tender, normal bowel sounds, no hepatomegaly, and no splenomegaly.   Rectal:  No external abnormalities noted. Normal sphincter tone. No rectal masses or tenderness. Genitalia:  Normal introitus for age, no external lesions, no vaginal discharge, mucosa pink and moist, no vaginal or cervical lesions, no vaginal atrophy, no friaility or hemorrhage, normal uterus size and position, no adnexal masses or tenderness Extremities:  No clubbing, cyanosis, edema, or deformity noted with normal full range of motion of all joints.   Neurologic:  alert & oriented X3, sensation intact to light touch, and gait normal.   Skin:  Intact without suspicious lesions or rashes Pt  has lg lipoma mid thoracic back Cervical Nodes:  No lymphadenopathy noted Axillary Nodes:  No palpable lymphadenopathy Psych:  Cognition and judgment appear intact. Alert and cooperative with normal attention span and concentration. No apparent delusions, illusions, hallucinations   Impression & Recommendations:  Problem # 1:  Preventive Health Care (ICD-V70.0) Encouraged periodic SBE. Tdap given today. Encourage pt to sched routine eye exam  Problem # 2:  HYPERTENSION (ICD-401.9) Assessment: Unchanged  The following medications were removed from the medication list:    Lisinopril-hydrochlorothiazide 20-25 Mg Tabs (Lisinopril-hydrochlorothiazide) .Marland Kitchen... 1 q am for high blood pressure Her updated medication list for this problem includes:    Amlodipine Besylate 10 Mg Tabs (Amlodipine besylate) .Marland Kitchen... 1 daily for high blood pressure    Lisinopril-hydrochlorothiazide 20-12.5 Mg Tabs (Lisinopril-hydrochlorothiazide) .Marland Kitchen... Take 2 tabs every morning for blood pressure  BP today: 150/90 Prior BP: 144/98 (05/31/2009)  Prior 10 Yr Risk Heart Disease: 8 % (12/06/2008)  Labs Reviewed: K+: 4.4 (05/24/2009) Creat: : 0.60 (05/24/2009)   Chol: 172 (05/24/2009)   HDL: 66 (05/24/2009)   LDL: 97 (05/24/2009)   TG: 46 (05/24/2009)  Problem # 3:  MENORRHAGIA (ICD-626.2) Assessment: Unchanged Discussed Gyn referral for heavy menses.  Discussed possible ablation or Mirena IUD for treatment.  Also discussed with pt that this should improve/resolve once she goes thru menopause.  Problem # 4:  LIPOMA (ICD-214.9) Assessment: Unchanged  Discussed surgical referral with pt since this is bothersome to her.  Orders: Surgical Referral (  Surgery)  Problem # 5:  ANEMIA-IRON DEFICIENCY (ICD-280.9)  Encouraged pt to restart Fe+ after her colonoscopy next week.  Orders: T-CBC No Diff ([id number]) T-Iron ([id number])  Problem # 6:  VITAMIN D DEFICIENCY (ICD-268.9)  Encouraged pt to pick up  prescription for Vit D & start taking as prescribed.  Orders: T-Vitamin D (25-Hydroxy) [phone])  Complete Medication List: 1)  Amlodipine Besylate 10 Mg Tabs (Amlodipine besylate) .Marland Kitchen.. 1 daily for high blood pressure 2)  Ventolin Hfa 108 (90 Base) Mcg/act Aers (Albuterol sulfate) .... 2 puffs q 4 hrs prn 3)  Vitamin D (ergocalciferol) 50000 Unit Caps (Ergocalciferol) .... One tab by mouth every week 4)  Lisinopril-hydrochlorothiazide 20-12.5 Mg Tabs (Lisinopril-hydrochlorothiazide) .... Take 2 tabs every morning for blood pressure  Other Orders: Hemoccult Guaiac-1 spec.(in office) (82270) Pap Smear (34742) Tdap => 87yrs IM (59563) Admin 1st Vaccine (87564)  Patient Instructions: 1)  Please schedule a follow-up appointment in 3 months. 2)  Tobacco is very bad for your health and your loved ones! You Should stop smoking!. 3)  Stop Smoking Tips: Choose a Quit date. Cut down before the Quit date. decide what you will do as a substitute when you feel the urge to smoke(gum,toothpick,exercise). 4)  It is important that you exercise regularly at least 20 minutes 5 times a week. If you develop chest pain, have severe difficulty breathing, or feel very tired , stop exercising immediately and seek medical attention. 5)  You need to lose weight. Consider a lower calorie diet and regular exercise.  6)  I have changed your Lisinopril dosage.  Discontinue taking the previous ones & start the new prescription. 7)  Schedule an eye exam. 8)  I have referred you to a surgeon to see about having your Lipoma removed. 9)  You have received a Tetnus vaccine today.  Your next one is due in 10 yrs. Prescriptions: LISINOPRIL-HYDROCHLOROTHIAZIDE 20-12.5 MG TABS (LISINOPRIL-HYDROCHLOROTHIAZIDE) take 2 tabs every morning for blood pressure  #60 x 3   Entered and Authorized by:   Esperanza Sheets PA   Signed by:   Esperanza Sheets PA on 06/30/2009   Method used:   Electronically to        [facility]  [phone]* (retail)       [address] [place]  [address]       Ph: [phone]       Fax: [phone]   RxID:   [id number]    Immunizations Administered:  Tetanus Vaccine:    Vaccine Type: Tdap    Site: right deltoid    Mfr: GlaxoSmithKline    Dose: 0.5 ml    Route: IM    Given by: Adella Hare LPN    Exp. Date: 06/18/2011    Lot #: [id number]    VIS given: 02/11/07 version given June 30, 2009.   Preventive Care Screening  Last Tetanus Booster:    Date:  06/30/2009    Results:  Tdap  Hemoccult:    Date:  06/30/2009    Next Due:  06/2010    Results:  negative   Laboratory Results    Stool - Occult Blood Hemmoccult #1: negative Date: 06/30/2009    Preventive Care Screening  Last Tetanus Booster:    Date:  06/30/2009    Results:  Tdap  Hemoccult:    Date:  06/30/2009    Next Due:  06/2010    Results:  negative   Prevention & Chronic  Care Immunizations   Influenza vaccine: Advised via HD  (02/27/2008)   Influenza vaccine due: 11/24/2009    Tetanus booster: 06/30/2009: Tdap   Tetanus booster due: 07/01/2019    Pneumococcal vaccine: Pneumovax  (02/27/2008)   Pneumococcal vaccine due: 03/07/2021  Colorectal Screening   Hemoccult: negative  (06/30/2009)   Hemoccult due: 06/2010    Colonoscopy: Refered but did not go  (07/30/2008)   Colonoscopy due: 10/2008  Other Screening   Pap smear: NEGATIVE FOR INTRAEPITHELIAL LESIONS OR MALIGNANCY.  (06/04/2008)   Pap smear due: 05/2009    Mammogram: ASSESSMENT: Negative - BI-RADS 1^MM DIGITAL SCREENING  (06/20/2009)   Mammogram due: 06/2009   Smoking status: current  (07/30/2008)   Smoking cessation counseling: yes  (06/30/2009)  Lipids   Total Cholesterol: 172  (05/24/2009)   LDL: 97  (05/24/2009)   LDL Direct: Not documented   HDL: 66  (05/24/2009)   Triglycerides: 46  (05/24/2009)  Hypertension   Last Blood Pressure: 150 / 90  (06/30/2009)   Serum creatinine: 0.60   (05/24/2009)   Serum potassium 4.4  (05/24/2009)  Self-Management Support :    Hypertension self-management support: Not documented

## 2010-04-27 NOTE — Miscellaneous (Signed)
Summary: refill  Clinical Lists Changes  Medications: Rx of AMLODIPINE BESYLATE 10 MG TABS (AMLODIPINE BESYLATE) 1 daily for high blood pressure;  #30 Tablet x 4;  Signed;  Entered by: Everitt Amber LPN;  Authorized by: Syliva Overman MD;  Method used: Electronically to [facility] [address] [phone]*, [address] [place], [place]  [address], Ph: [phone], Fax: [phone]    Prescriptions: AMLODIPINE BESYLATE 10 MG TABS (AMLODIPINE BESYLATE) 1 daily for high blood pressure  #30 Tablet x 4   Entered by:   Everitt Amber LPN   Authorized by:   Syliva Overman MD   Signed by:   Everitt Amber LPN on 57/84/6962   Method used:   Electronically to        [facility] [address] [phone]* (retail)       [address] [place]  [address]       Ph: [phone]       Fax: [phone]   RxID:   [id number]

## 2010-04-27 NOTE — Letter (Signed)
Summary: 1ST MISSED LETTER  1ST MISSED LETTER   Imported By: Lind Guest 03/07/2010 09:57:42  _____________________________________________________________________  External Attachment:    Type:   Image     Comment:   External Document

## 2010-04-27 NOTE — Assessment & Plan Note (Signed)
Summary: meds   Vital Signs:  Patient profile:   55 year old female Menstrual status:  regular LMP:     03/07/2010 Height:      62 inches Weight:      167.75 pounds BMI:     30.79 O2 Sat:      98 % on Room air Pulse rate:   94 / minute Pulse rhythm:   regular Resp:     16 per minute BP sitting:   130 / 82  (left arm)  Vitals Entered By: Adella Hare LPN (March 07, 2010 2:57 PM)  Nutrition Counseling: Patient's BMI is greater than 25 and therefore counseled on weight management options.  O2 Flow:  Room air CC: follow-up visit Is Patient Diabetic? No Pain Assessment Patient in pain? no      Comments did not bring meds to ov LMP (date): 03/07/2010     Enter LMP: 03/07/2010 Last PAP Result ENDOMETRIAL CELLS ARE PRESENT. CLINICAL CORRELATION AND CORRELATION WITH THE LMP IS RECOMMENDED.   Primary Care Avynn Klassen:  Syliva Overman MD  CC:  follow-up visit.  History of Present Illness: Reports  that she has been  doing well. Denies recent fever or chills. Denies sinus pressure, nasal congestion , ear pain or sore throat. Denies chest congestion, or cough productive of sputum. Denies chest pain, palpitations, PND, orthopnea or leg swelling. Denies abdominal pain, nausea, vomitting, diarrhea or constipation. Denies change in bowel movements or bloody stool. Denies dysuria , frequency, incontinence or hesitancy. Denies  joint pain, swelling, or reduced mobility. Denies headaches, vertigo, seizures. Denies depression, anxiety or insomnia. Denies  rash, lesions, or itch.     Allergies (verified): 1)  ! * Vitamin D 50,000 Units  Review of Systems      See HPI Eyes:  Denies blurring and discharge. Endo:  Denies cold intolerance and excessive hunger. Heme:  Denies abnormal bruising and bleeding. Allergy:  Denies hives or rash and itching eyes.  Physical Exam  General:  Well-developed,well-nourished,in no acute distress; alert,appropriate and cooperative  throughout examination HEENT: No facial asymmetry,  EOMI, No sinus tenderness, TM's Clear, oropharynx  pink and moist.   Chest: Clear to auscultation bilaterally.  CVS: S1, S2, No murmurs, No S3.   Abd: Soft, Nontender.  MS: Adequate ROM spine, hips, shoulders and knees.  Ext: No edema.   CNS: CN 2-12 intact, power tone and sensation normal throughout.   Skin: Intact, no visible lesions or rashes.  Psych: Good eye contact, normal affect.  Memory intact, not anxious or depressed appearing.    Impression & Recommendations:  Problem # 1:  HYPERTENSION (ICD-401.9) Assessment Improved  The following medications were removed from the medication list:    Lisinopril-hydrochlorothiazide 20-12.5 Mg Tabs (Lisinopril-hydrochlorothiazide) .Marland Kitchen... Take 2 tabs every morning for blood pressure Her updated medication list for this problem includes:    Amlodipine Besylate 10 Mg Tabs (Amlodipine besylate) .Marland Kitchen... 1 daily for high blood pressure  BP today: 130/82 Prior BP: 132/90 (10/06/2009)  Prior 10 Yr Risk Heart Disease: 8 % (12/06/2008)  Labs Reviewed: K+: 3.7 (10/06/2009) Creat: : 0.80 (10/06/2009)   Chol: 172 (05/24/2009)   HDL: 66 (05/24/2009)   LDL: 97 (05/24/2009)   TG: 46 (05/24/2009)  Problem # 2:  OBESITY (ICD-278.00) Assessment: Unchanged  Ht: 62 (03/07/2010)   Wt: 167.75 (03/07/2010)   BMI: 30.79 (03/07/2010) therapeutic lifestyle change discussed and encouraged  Problem # 3:  TOBACCO USER (ICD-305.1) Assessment: Unchanged  Encouraged smoking cessation and discussed different methods for  smoking cessation.   Problem # 4:  ASTHMA (ICD-493.90) Assessment: Unchanged  Her updated medication list for this problem includes:    Ventolin Hfa 108 (90 Base) Mcg/act Aers (Albuterol sulfate) .Marland Kitchen... 2 puffs q 4 hrs prn  Complete Medication List: 1)  Amlodipine Besylate 10 Mg Tabs (Amlodipine besylate) .Marland Kitchen.. 1 daily for high blood pressure 2)  Ventolin Hfa 108 (90 Base) Mcg/act Aers  (Albuterol sulfate) .... 2 puffs q 4 hrs prn 3)  Oscal 500/200 D-3 500-200 Mg-unit Tabs (Calcium carbonate-vitamin d) .... Take 1 tablet by mouth three times a day 4)  Multivitamin  .... One daily  Patient Instructions: 1)  CPE in 4 months.+ 2)  It is important that you exercise regularly at least 20 minutes 5 times a week. If you develop chest pain, have severe difficulty breathing, or feel very tired , stop exercising immediately and seek medical attention. 3)  You need to lose weight. Consider a lower calorie diet and regular exercise.  Prescriptions: OSCAL 500/200 D-3 500-200 MG-UNIT TABS (CALCIUM CARBONATE-VITAMIN D) Take 1 tablet by mouth three times a day  #90 x 11   Entered and Authorized by:   Syliva Overman MD   Signed by:   Syliva Overman MD on 03/07/2010   Method used:   Electronically to        [facility] [address] [phone]* (retail)       [address] [place], [place]  [address]       Ph: [phone]       Fax: [phone]   RxID:   [id number]    Orders Added: 1)  Est. Patient Level IV [44010]

## 2010-05-16 ENCOUNTER — Telehealth: Payer: Self-pay | Admitting: Family Medicine

## 2010-05-23 NOTE — Progress Notes (Signed)
Summary: inhaler  Phone Note Call from Patient   Summary of Call: needs her inhaler send to [facility] [place] call back at [phone] to let her know Initial call taken by: Lind Guest,  May 16, 2010 4:35 PM    Prescriptions: VENTOLIN HFA 108 (90 BASE) MCG/ACT AERS (ALBUTEROL SULFATE) 2 puffs q 4 hrs prn  #1 x 2   Entered by:   Everitt Amber LPN   Authorized by:   Syliva Overman MD   Signed by:   Everitt Amber LPN on 04/54/0981   Method used:   Electronically to        [facility] [address] [phone]* (retail)       [address], [place]  [address]       Ph: [phone]       Fax: [phone]   RxID:   [id number]

## 2010-06-13 LAB — CBC
Hemoglobin: 12.6 g/dL (ref 12.0–15.0)
MCHC: 34.6 g/dL (ref 30.0–36.0)
MCV: 84.5 fL (ref 78.0–100.0)
Platelets: 340 10*3/uL (ref 150–400)
WBC: 10.1 10*3/uL (ref 4.0–10.5)

## 2010-06-13 LAB — BASIC METABOLIC PANEL
CO2: 28 mEq/L (ref 19–32)
Chloride: 104 mEq/L (ref 96–112)
Creatinine, Ser: 0.7 mg/dL (ref 0.4–1.2)
Potassium: 3.4 mEq/L — ABNORMAL LOW (ref 3.5–5.1)
Sodium: 138 mEq/L (ref 135–145)

## 2010-07-17 ENCOUNTER — Encounter: Payer: Self-pay | Admitting: Family Medicine

## 2010-07-18 ENCOUNTER — Telehealth: Payer: Self-pay | Admitting: Family Medicine

## 2010-07-18 ENCOUNTER — Encounter: Payer: Self-pay | Admitting: Family Medicine

## 2010-07-18 ENCOUNTER — Ambulatory Visit (INDEPENDENT_AMBULATORY_CARE_PROVIDER_SITE_OTHER): Payer: BC Managed Care – PPO | Admitting: Family Medicine

## 2010-07-18 ENCOUNTER — Other Ambulatory Visit: Payer: Self-pay | Admitting: Family Medicine

## 2010-07-18 VITALS — BP 152/90 | HR 81 | Resp 16 | Ht 62.0 in | Wt 164.1 lb

## 2010-07-18 DIAGNOSIS — M719 Bursopathy, unspecified: Secondary | ICD-10-CM

## 2010-07-18 DIAGNOSIS — R5381 Other malaise: Secondary | ICD-10-CM

## 2010-07-18 DIAGNOSIS — Z139 Encounter for screening, unspecified: Secondary | ICD-10-CM

## 2010-07-18 DIAGNOSIS — J45909 Unspecified asthma, uncomplicated: Secondary | ICD-10-CM

## 2010-07-18 DIAGNOSIS — Z79899 Other long term (current) drug therapy: Secondary | ICD-10-CM

## 2010-07-18 DIAGNOSIS — R5383 Other fatigue: Secondary | ICD-10-CM

## 2010-07-18 DIAGNOSIS — M778 Other enthesopathies, not elsewhere classified: Secondary | ICD-10-CM

## 2010-07-18 DIAGNOSIS — M67919 Unspecified disorder of synovium and tendon, unspecified shoulder: Secondary | ICD-10-CM

## 2010-07-18 DIAGNOSIS — F17200 Nicotine dependence, unspecified, uncomplicated: Secondary | ICD-10-CM

## 2010-07-18 DIAGNOSIS — Z1239 Encounter for other screening for malignant neoplasm of breast: Secondary | ICD-10-CM

## 2010-07-18 DIAGNOSIS — I10 Essential (primary) hypertension: Secondary | ICD-10-CM

## 2010-07-18 DIAGNOSIS — E669 Obesity, unspecified: Secondary | ICD-10-CM

## 2010-07-18 MED ORDER — BENAZEPRIL-HYDROCHLOROTHIAZIDE 20-25 MG PO TABS: 1.0000 | ORAL_TABLET | Freq: Every day | ORAL | Status: DC

## 2010-07-18 MED ORDER — KETOROLAC TROMETHAMINE 30 MG/ML IJ SOLN
60.0000 mg | Freq: Once | INTRAMUSCULAR | Status: AC
  Administered 2010-07-18: 60 mg via INTRAMUSCULAR

## 2010-07-18 MED ORDER — METHYLPREDNISOLONE ACETATE 80 MG/ML IJ SUSP
80.0000 mg | Freq: Once | INTRAMUSCULAR | Status: AC
  Administered 2010-07-18: 80 mg via INTRAMUSCULAR

## 2010-07-18 MED ORDER — PREDNISONE (PAK) 5 MG PO TABS: 5.0000 mg | ORAL_TABLET | ORAL | Status: AC

## 2010-07-18 NOTE — Progress Notes (Signed)
  Subjective:    Patient ID: Paula Best, female    DOB: 12-24-1955, 55 y.o.   MRN: [id number]  HPI 3 month h/o left upper arm pain as though it will lock. Does a lot of upper body movement on the job. Smokes 1 PPD , unwilling to quit now. She is here for f/u chronic problems . Cancer screen and immunization are updated  Review of Systems Denies recent fever or chills. Denies sinus pressure, nasal congestion, ear pain or sore throat. Denies chest congestion, productive cough or wheezing.No asthma flare for months. Denies chest pains, palpitations, paroxysmal nocturnal dyspnea, orthopnea and leg swelling Denies abdominal pain, nausea, vomiting,diarrhea or constipation.  Denies rectal bleeding or change in bowel movement. Denies dysuria, frequency, hesitancy or incontinence. Denies headaches, seizure, numbness, or tingling. Denies depression, anxiety or insomnia. Denies skin break down or rash.        Objective:   Physical Exam Patient alert and oriented and in no Cardiopulmonary distress.  HEENT: No facial asymmetry, EOMI, no sinus tenderness, TM's clear, Oropharynx pink and moist.  Neck supple no adenopathy.  Chest: Clear to auscultation bilaterally.No wheezes or crackles  CVS: S1, S2 , murmur, no S3.  ABD: Soft non tender. Bowel sounds normal.  Ext: No edema  MS: Adequate ROM spine,  hips and knees.Reduced in left shoulder.  Skin: Intact, no ulcerations or rash noted.  Psych: Good eye contact, normal affect. Memory intact not anxious or depressed appearing.  CNS: CN 2-12 intact, power, tone and sensation normal throughout.        Assessment & Plan:

## 2010-07-18 NOTE — Telephone Encounter (Signed)
thanks

## 2010-07-18 NOTE — Patient Instructions (Addendum)
CPE in 3 months.  Your blood pressure  Is too high continue amlodipine 1 daily, and add  Another taablet which i am prescribing  Mammogram will be scheduled  CBC , fasting chem 7 , lipid and TSH  In 6 to 7 weeks  It is important that you exercise regularly at least 30 minutes 5 times a week. If you develop chest pain, have severe difficulty breathing, or feel very tired, stop exercising immediately and seek medical attention  A healthy diet is rich in fruit, vegetables and whole grains. Poultry fish, nuts and beans are a healthy choice for protein rather then red meat. A low sodium diet and drinking 64 ounces of water daily is generally recommended. Oils and sweet should be limited. Carbohydrates especially for those who are diabetic or overweight, should be limited to 34-45 gram per meal. It is important to eat on a regular schedule, at least 3 times daily. Snacks should be primarily fruits, vegetables or nuts.   Your bones need calcium 1200mg   With vit d 1000IU once daily, this is otc as a soft gel  You will get injections today for left arm pain and medication is also sent in  Please think about quitting smoking.  This is very important for your health.  Consider setting a quit date, then cutting back or switching brands to prepare to stop.  Also think of the money you will save every day by not smoking.  Quick Tips to Quit Smoking: Fix a date i.e. keep a date in mind from when you would not touch a tobacco product to smoke  Keep yourself busy and block your mind with work loads or reading books or watching movies in malls where smoking is not allowed  Vanish off the things which reminds you about smoking for example match box, or your favorite lighter, or the pipe you used for smoking, or your favorite jeans and shirt with which you used to enjoy smoking, or the club where you used to do smoking  Try to avoid certain people places and incidences where and with whom smoking is a common  factor to add on  Praise yourself with some token gifts from the money you saved by stopping smoking  Anti Smoking teams are there to help you. Join their programs  Anti-smoking Gums are there in many medical shops. Try them to quit smoking   Side-effects of Smoking: Disease caused by smoking cigarettes are emphysema, bronchitis, heart failures  Premature death  Cancer is the major side effect of smoking  Heart attacks and strokes are the quick effects of smoking causing sudden death  Some smokers lives end up with limbs amputated  Breathing problem or fast breathing is another side effect of smoking  Due to more intakes of smokes, carbon mono-oxide goes into your brain and other muscles of the body which leads to swelling of the veins and blockage to the air passage to lungs  Carbon monoxide blocks blood vessels which leads to blockage in the flow of blood to different major body organs like heart lungs and thus leads to attacks and deaths  During pregnancy smoking is very harmful and leads to premature birth of the infant, spontaneous abortions, low weight of the infant during birth  Fat depositions to narrow and blocked blood vessels causing heart attacks  In many cases cigarette smoking caused infertility in men

## 2010-07-24 ENCOUNTER — Ambulatory Visit (HOSPITAL_COMMUNITY)
Admission: RE | Admit: 2010-07-24 | Discharge: 2010-07-24 | Disposition: A | Discharge status: Home / Self Care | Payer: BC Managed Care – PPO | Source: Ambulatory Visit | Attending: Family Medicine | Admitting: Family Medicine

## 2010-07-24 DIAGNOSIS — Z1239 Encounter for other screening for malignant neoplasm of breast: Secondary | ICD-10-CM

## 2010-07-24 DIAGNOSIS — Z1231 Encounter for screening mammogram for malignant neoplasm of breast: Secondary | ICD-10-CM | POA: Insufficient documentation

## 2010-07-26 ENCOUNTER — Other Ambulatory Visit: Payer: Self-pay | Admitting: Family Medicine

## 2010-07-26 DIAGNOSIS — R928 Other abnormal and inconclusive findings on diagnostic imaging of breast: Secondary | ICD-10-CM

## 2010-07-30 ENCOUNTER — Encounter: Payer: Self-pay | Admitting: Family Medicine

## 2010-07-30 NOTE — Assessment & Plan Note (Signed)
Controlled, no change in medication  

## 2010-07-30 NOTE — Assessment & Plan Note (Signed)
Hypertension:Controlled, no changes in medication.   

## 2010-07-30 NOTE — Assessment & Plan Note (Signed)
Unchanged, counseled to quit 

## 2010-07-30 NOTE — Assessment & Plan Note (Signed)
unchanged Patient re-educated about  the importance of commitment to a  minimum of 150 minutes of exercise per week. The importance of healthy food choices with portion control discussed. Encouraged to start a food diary, count calories and to consider  joining a support group. Sample diet sheets offered. Goals set by the patient for the next several months.    

## 2010-10-17 ENCOUNTER — Encounter: Payer: Self-pay | Admitting: Family Medicine

## 2010-10-19 ENCOUNTER — Ambulatory Visit (INDEPENDENT_AMBULATORY_CARE_PROVIDER_SITE_OTHER): Payer: BC Managed Care – PPO | Admitting: Family Medicine

## 2010-10-19 ENCOUNTER — Other Ambulatory Visit (HOSPITAL_COMMUNITY)
Admission: RE | Admit: 2010-10-19 | Discharge: 2010-10-19 | Disposition: A | Discharge status: Home / Self Care | Payer: BC Managed Care – PPO | Source: Ambulatory Visit | Attending: Family Medicine | Admitting: Family Medicine

## 2010-10-19 ENCOUNTER — Encounter: Payer: Self-pay | Admitting: Family Medicine

## 2010-10-19 VITALS — BP 142/90 | HR 86 | Resp 16 | Ht 61.75 in | Wt 161.0 lb

## 2010-10-19 DIAGNOSIS — Z13220 Encounter for screening for lipoid disorders: Secondary | ICD-10-CM

## 2010-10-19 DIAGNOSIS — Z1211 Encounter for screening for malignant neoplasm of colon: Secondary | ICD-10-CM

## 2010-10-19 DIAGNOSIS — I10 Essential (primary) hypertension: Secondary | ICD-10-CM

## 2010-10-19 DIAGNOSIS — Z01419 Encounter for gynecological examination (general) (routine) without abnormal findings: Secondary | ICD-10-CM | POA: Insufficient documentation

## 2010-10-19 DIAGNOSIS — Z124 Encounter for screening for malignant neoplasm of cervix: Secondary | ICD-10-CM

## 2010-10-19 DIAGNOSIS — J45909 Unspecified asthma, uncomplicated: Secondary | ICD-10-CM

## 2010-10-19 DIAGNOSIS — F17200 Nicotine dependence, unspecified, uncomplicated: Secondary | ICD-10-CM

## 2010-10-19 DIAGNOSIS — Z0000 Encounter for general adult medical examination without abnormal findings: Secondary | ICD-10-CM

## 2010-10-19 DIAGNOSIS — E559 Vitamin D deficiency, unspecified: Secondary | ICD-10-CM

## 2010-10-19 MED ORDER — VITAMIN D3 1.25 MG (50000 UT) PO CAPS: 50000.0000 [IU] | ORAL_CAPSULE | ORAL | Status: DC

## 2010-10-19 MED ORDER — VITAMIN D (ERGOCALCIFEROL) 1.25 MG (50000 UNIT) PO CAPS: 50000.0000 [IU] | ORAL_CAPSULE | ORAL | Status: DC

## 2010-10-19 MED ORDER — HYDROCHLOROTHIAZIDE 12.5 MG PO CAPS: 12.5000 mg | ORAL_CAPSULE | Freq: Every day | ORAL | Status: DC

## 2010-10-19 NOTE — Patient Instructions (Addendum)
F/u in 3 months.  Fasting chem 7, lipid and vit D  In 3 months.   Please think about quitting smoking.  This is very important for your health.  Consider setting a quit date, then cutting back or switching brands to prepare to stop.  Also think of the money you will save every day by not smoking.  Quick Tips to Quit Smoking: Fix a date i.e. keep a date in mind from when you would not touch a tobacco product to smoke  Keep yourself busy and block your mind with work loads or reading books or watching movies in malls where smoking is not allowed  Vanish off the things which reminds you about smoking for example match box, or your favorite lighter, or the pipe you used for smoking, or your favorite jeans and shirt with which you used to enjoy smoking, or the club where you used to do smoking  Try to avoid certain people places and incidences where and with whom smoking is a common factor to add on  Praise yourself with some token gifts from the money you saved by stopping smoking  Anti Smoking teams are there to help you. Join their programs  Anti-smoking Gums are there in many medical shops. Try them to quit smoking   Side-effects of Smoking: Disease caused by smoking cigarettes are emphysema, bronchitis, heart failures  Premature death  Cancer is the major side effect of smoking  Heart attacks and strokes are the quick effects of smoking causing sudden death  Some smokers lives end up with limbs amputated  Breathing problem or fast breathing is another side effect of smoking  Due to more intakes of smokes, carbon mono-oxide goes into your brain and other muscles of the body which leads to swelling of the veins and blockage to the air passage to lungs  Carbon monoxide blocks blood vessels which leads to blockage in the flow of blood to different major body organs like heart lungs and thus leads to attacks and deaths  During pregnancy smoking is very harmful and leads to premature birth of the  infant, spontaneous abortions, low weight of the infant during birth  Fat depositions to narrow and blocked blood vessels causing heart attacks  In many cases cigarette smoking caused infertility in men    Your blood pressure is high, you need an additional tablet, this has been sent in.  Your vit D is low , you need to take once weekly capsules for the next 6 months, this is sent to your pharmacy

## 2010-10-19 NOTE — Assessment & Plan Note (Signed)
Unchanged, encouraged to quit, unwilling to set quit date at this time

## 2010-10-19 NOTE — Assessment & Plan Note (Signed)
Controlled, no change in medication  

## 2010-10-19 NOTE — Progress Notes (Signed)
  Subjective:    Patient ID: Paula Best, female    DOB: [birth year], 55 y.o.   MRN: [id number]  HPI The PT is here for annual exam and re-evaluation of chronic medical conditions, medication management and review of recent lab and radiology data.  Preventive health is updated, specifically  Cancer screening,and Immunization.   Questions or concerns regarding consultations or procedures which the PT has had in the interim are  addressed. The PT denies any adverse reactions to current medications since the last visit.  There are no new concerns.  There are no specific complaints       Review of Systems Denies recent fever or chills. Denies sinus pressure, nasal congestion, ear pain or sore throat. Denies chest congestion, productive cough or wheezing. Denies chest pains, palpitations, paroxysmal nocturnal dyspnea, orthopnea and leg swelling Denies abdominal pain, nausea, vomiting,diarrhea or constipation.  Denies rectal bleeding or change in bowel movement. Denies dysuria, frequency, hesitancy or incontinence. Denies joint pain, swelling and limitation in mobility. Denies headaches, seizure, numbness, or tingling. Denies depression, anxiety or insomnia. Denies skin break down or rash.        Objective:   Physical Exam Pleasant well nourished female, alert and oriented x 3, in no cardio-pulmonary distress. Afebrile. HEENT No facial trauma or asymetry.Sinus non tender. EOMI, PERTL, fundoscopic exam is normal, no hemorhage or exudate. External ears normal, tympanic membranes clear. Oropharynx moist, no exudate, good dentition. Neck: supple, no adenopathy,JVD or thyromegaly.No bruits.  Chest: Clear to ascultation bilaterally.No crackles or wheezes. Non tender to palpation. Reduced air entry bilaterally.  Breast: No asymetry,no masses. No nipple discharge or inversion. No axillary or supraclavicular adenopathy  Cardiovascular system; Heart sounds normal,  S1 and  S2  ,no S3.  No murmur, or thrill. Apical beat not displaced Peripheral pulses normal.  Abdomen: Soft, non tender, no organomegaly or masses. No bruits. Bowel sounds normal. No guarding, tenderness or rebound.  Rectal:  No mass. guaic negative stool.  GU: External genitalia normal. No lesions. Vaginal canal normal.No discharge. Uterus normal size, no adnexal masses, no cervical motion or adnexal tenderness.  Musculoskeletal exam: Full ROM of spine, hips , shoulders and knees. No deformity ,swelling or crepitus noted. No muscle wasting or atrophy.   Neurologic: Cranial nerves 2 to 12 intact. Power, tone ,sensation and reflexes normal throughout. No disturbance in gait. No tremor.  Skin: Intact, no ulceration, erythema , scaling or rash noted. Pigmentation normal throughout  Psych; Normal mood and affect. Judgement and concentration normal        Assessment & Plan:   HYPERTENSION Medication compliance addressed. Commitment to regular exercise and healthy  food choices, with portion control discussed. DASH diet and low fat diet discussed and literature offered. Changes in medication made at this visit.   TOBACCO USER Unchanged, encouraged to quit, unwilling to set quit date at this time  ASTHMA Controlled, no change in medication

## 2010-10-19 NOTE — Assessment & Plan Note (Signed)
Medication compliance addressed. Commitment to regular exercise and healthy  food choices, with portion control discussed. DASH diet and low fat diet discussed and literature offered. Changes in medication made at this visit.  

## 2010-10-24 ENCOUNTER — Encounter: Payer: Self-pay | Admitting: *Deleted

## 2011-01-02 ENCOUNTER — Other Ambulatory Visit: Payer: Self-pay | Admitting: Family Medicine

## 2011-01-23 ENCOUNTER — Encounter: Payer: Self-pay | Admitting: Family Medicine

## 2011-01-25 ENCOUNTER — Ambulatory Visit (INDEPENDENT_AMBULATORY_CARE_PROVIDER_SITE_OTHER): Payer: BC Managed Care – PPO | Admitting: Family Medicine

## 2011-01-25 ENCOUNTER — Encounter: Payer: Self-pay | Admitting: Family Medicine

## 2011-01-25 VITALS — BP 150/100 | HR 80 | Resp 16 | Ht 62.0 in | Wt 159.0 lb

## 2011-01-25 DIAGNOSIS — M949 Disorder of cartilage, unspecified: Secondary | ICD-10-CM

## 2011-01-25 DIAGNOSIS — F17200 Nicotine dependence, unspecified, uncomplicated: Secondary | ICD-10-CM

## 2011-01-25 DIAGNOSIS — J45909 Unspecified asthma, uncomplicated: Secondary | ICD-10-CM

## 2011-01-25 DIAGNOSIS — R5381 Other malaise: Secondary | ICD-10-CM

## 2011-01-25 DIAGNOSIS — E669 Obesity, unspecified: Secondary | ICD-10-CM

## 2011-01-25 DIAGNOSIS — R5383 Other fatigue: Secondary | ICD-10-CM

## 2011-01-25 DIAGNOSIS — I10 Essential (primary) hypertension: Secondary | ICD-10-CM

## 2011-01-25 DIAGNOSIS — R7301 Impaired fasting glucose: Secondary | ICD-10-CM

## 2011-01-25 DIAGNOSIS — Z13220 Encounter for screening for lipoid disorders: Secondary | ICD-10-CM

## 2011-01-25 DIAGNOSIS — M899 Disorder of bone, unspecified: Secondary | ICD-10-CM

## 2011-01-25 MED ORDER — AMLODIPINE BESYLATE 10 MG PO TABS: 10.0000 mg | ORAL_TABLET | Freq: Every day | ORAL | Status: DC

## 2011-01-25 NOTE — Patient Instructions (Addendum)
F/U in December.  Your blood pressure is too high, you need to take both medications.  Fasting labs NEED to be done.   It is important that you exercise regularly at least 30 minutes 5 times a week. If you develop chest pain, have severe difficulty breathing, or feel very tired, stop exercising.  A healthy diet is rich in fruit, vegetables and whole grains. Poultry fish, nuts and beans are a healthy choice for protein rather then red meat. A low sodium diet and drinking 64 ounces of water daily is generally recommended. Oils and sweet should be limited. Carbohydrates especially for those who are diabetic or overweight, should be limited to 34-45 gram per meal. It is important to eat on a regular schedule, at least 3 times daily. Snacks should be primarily fruits, vegetables or nuts.   pls consider the flu vaccine , you need one  Congrats on weight loss, keep it up  Pls start 1 vitamin tab daily, also calcium with D 1200mg  /1000IU one daily for bone health

## 2011-01-25 NOTE — Progress Notes (Signed)
  Subjective:    Patient ID: Paula Best, female    DOB: 12-31-1955, 55 y.o.   MRN: [id number]  HPI The PT is here for follow up and re-evaluation of chronic medical conditions, medication management and review of any available recent lab and radiology data.  Preventive health is updated, specifically  Cancer screening and Immunization.   Questions or concerns regarding consultations or procedures which the PT has had in the interim are  addressed. The PT denies any adverse reactions to current medications since the last visit.  There are no new concerns.  There are no specific complaints       Review of Systems See HPI Denies recent fever or chills. Denies sinus pressure, nasal congestion, ear pain or sore throat. Denies chest congestion, productive cough or wheezing. Denies chest pains, palpitations and leg swelling Denies abdominal pain, nausea, vomiting,diarrhea or constipation.   Denies dysuria, frequency, hesitancy or incontinence. Denies joint pain, swelling and limitation in mobility. Denies headaches, seizures, numbness, or tingling. Denies depression, anxiety or insomnia. Denies skin break down or rash.        Objective:   Physical Exam   Patient alert and oriented and in no cardiopulmonary distress.  HEENT: No facial asymmetry, EOMI, no sinus tenderness,  oropharynx pink and moist.  Neck supple no adenopathy.  Chest: Clear to auscultation bilaterally.  CVS: S1, S2 no murmurs, no S3.  ABD: Soft non tender. Bowel sounds normal.  Ext: No edema  MS: Adequate ROM spine, shoulders, hips and knees.  Skin: Intact, no ulcerations or rash noted.  Psych: Good eye contact, normal affect. Memory intact not anxious or depressed appearing.  CNS: CN 2-12 intact, power, tone and sensation normal throughout.      Assessment & Plan:

## 2011-01-27 NOTE — Assessment & Plan Note (Signed)
Uncontrolled due to medical non compliance , pt re educated about the need to comply with prescription med

## 2011-01-27 NOTE — Assessment & Plan Note (Deleted)
Deteriorated. Patient re-educated about  the importance of commitment to a  minimum of 150 minutes of exercise per week. The importance of healthy food choices with portion control discussed. Encouraged to start a food diary, count calories and to consider  joining a support group. Sample diet sheets offered. Goals set by the patient for the next several months.    

## 2011-01-27 NOTE — Assessment & Plan Note (Signed)
Stable and controlled, no recent asthma flares

## 2011-01-27 NOTE — Assessment & Plan Note (Signed)
Unchanged, counselled to quit 

## 2011-02-24 LAB — CBC WITH DIFFERENTIAL/PLATELET
Basophils Relative: 0 % (ref 0–1)
Eosinophils Absolute: 0.1 10*3/uL (ref 0.0–0.7)
HCT: 37.1 % (ref 36.0–46.0)
Hemoglobin: 11.8 g/dL — ABNORMAL LOW (ref 12.0–15.0)
Lymphs Abs: 1.6 10*3/uL (ref 0.7–4.0)
MCH: 27.3 pg (ref 26.0–34.0)
MCHC: 31.8 g/dL (ref 30.0–36.0)
Monocytes Absolute: 0.6 10*3/uL (ref 0.1–1.0)
Monocytes Relative: 10 % (ref 3–12)
Neutro Abs: 3.6 10*3/uL (ref 1.7–7.7)
Neutrophils Relative %: 60 % (ref 43–77)
RBC: 4.32 MIL/uL (ref 3.87–5.11)

## 2011-02-24 LAB — LIPID PANEL
Cholesterol: 165 mg/dL (ref 0–200)
LDL Cholesterol: 99 mg/dL (ref 0–99)
Total CHOL/HDL Ratio: 2.8 Ratio
Triglycerides: 28 mg/dL (ref <150)
VLDL: 6 mg/dL (ref 0–40)

## 2011-02-24 LAB — HEMOGLOBIN A1C
Hgb A1c MFr Bld: 6.1 % — ABNORMAL HIGH (ref <5.7)
Mean Plasma Glucose: 128 mg/dL — ABNORMAL HIGH (ref <117)

## 2011-02-24 LAB — HEPATIC FUNCTION PANEL
ALT: 8 U/L (ref 0–35)
Total Protein: 6.7 g/dL (ref 6.0–8.3)

## 2011-02-24 LAB — TSH: TSH: 0.807 u[IU]/mL (ref 0.350–4.500)

## 2011-03-01 ENCOUNTER — Encounter: Payer: Self-pay | Admitting: Family Medicine

## 2011-03-06 ENCOUNTER — Encounter: Payer: Self-pay | Admitting: Family Medicine

## 2011-03-06 ENCOUNTER — Ambulatory Visit (INDEPENDENT_AMBULATORY_CARE_PROVIDER_SITE_OTHER): Payer: BC Managed Care – PPO | Admitting: Family Medicine

## 2011-03-06 VITALS — BP 132/78 | HR 94 | Resp 18 | Ht 62.0 in | Wt 161.1 lb

## 2011-03-06 DIAGNOSIS — R7309 Other abnormal glucose: Secondary | ICD-10-CM

## 2011-03-06 DIAGNOSIS — R7303 Prediabetes: Secondary | ICD-10-CM

## 2011-03-06 DIAGNOSIS — F17200 Nicotine dependence, unspecified, uncomplicated: Secondary | ICD-10-CM

## 2011-03-06 DIAGNOSIS — E559 Vitamin D deficiency, unspecified: Secondary | ICD-10-CM

## 2011-03-06 DIAGNOSIS — I10 Essential (primary) hypertension: Secondary | ICD-10-CM

## 2011-03-06 DIAGNOSIS — J45909 Unspecified asthma, uncomplicated: Secondary | ICD-10-CM

## 2011-03-06 MED ORDER — VITAMIN D (ERGOCALCIFEROL) 1.25 MG (50000 UNIT) PO CAPS: 50000.0000 [IU] | ORAL_CAPSULE | ORAL | Status: DC

## 2011-03-06 NOTE — Patient Instructions (Addendum)
F/u mid April.  Blood pressure is excellent.  Blood sugar is too high, you need to reduce carbs and sweets and I recommend you attend group session on diabetic diet  I recommend the flu vaccine  Fasting chem 7 , HBa1C  And Vit D in April before visit.  Please take vit D as prescribed for the next 6 months , you will be given a script  Pls reconsider, you should get a flu vaccine

## 2011-03-06 NOTE — Progress Notes (Signed)
  Subjective:    Patient ID: Paula Best, female    DOB: 03-21-1956, 55 y.o.   MRN: [id number]  HPI The PT is here for follow up and re-evaluation of chronic medical conditions, medication management and review of any available recent lab and radiology data.  Preventive health is updated, specifically  Cancer screening and Immunization.   Questions or concerns regarding consultations or procedures which the PT has had in the interim are  addressed. The PT denies any adverse reactions to current medications since the last visit.  There are no new concerns.  There are no specific complaints       Review of Systems See HPI Denies recent fever or chills. Denies sinus pressure, nasal congestion, ear pain or sore throat. Denies chest congestion, productive cough or wheezing. Denies chest pains, palpitations and leg swelling Denies abdominal pain, nausea, vomiting,diarrhea or constipation.   Denies dysuria, frequency, hesitancy or incontinence. Denies joint pain, swelling and limitation in mobility. Denies headaches, seizures, numbness, or tingling. Denies depression, anxiety or insomnia. Denies skin break down or rash.        Objective:   Physical Exam Patient alert and oriented and in no cardiopulmonary distress.  HEENT: No facial asymmetry, EOMI, no sinus tenderness,  oropharynx pink and moist.  Neck supple no adenopathy.  Chest: Clear to auscultation bilaterally.Decreased air entry bilaterally  CVS: S1, S2 no murmurs, no S3.  ABD: Soft non tender. Bowel sounds normal.  Ext: No edema  MS: Adequate ROM spine, shoulders, hips and knees.  Skin: Intact, no ulcerations or rash noted.  Psych: Good eye contact, normal affect. Memory intact not anxious or depressed appearing.  CNS: CN 2-12 intact, power, tone and sensation normal throughout.`       Assessment & Plan:

## 2011-03-07 NOTE — Assessment & Plan Note (Signed)
Controlled and stable at this time 

## 2011-03-07 NOTE — Assessment & Plan Note (Signed)
Pt has not been taking med prescribed, importance of correcting the def explained and a script given

## 2011-03-07 NOTE — Assessment & Plan Note (Signed)
Cessation counseling done, no commitment to quit at this time

## 2011-03-07 NOTE — Assessment & Plan Note (Signed)
Controlled, no change in medication  

## 2011-05-22 ENCOUNTER — Encounter: Payer: Self-pay | Admitting: Family Medicine

## 2011-05-22 ENCOUNTER — Ambulatory Visit (INDEPENDENT_AMBULATORY_CARE_PROVIDER_SITE_OTHER): Payer: BC Managed Care – PPO | Admitting: Family Medicine

## 2011-05-22 VITALS — BP 150/100 | HR 90 | Resp 16 | Ht 62.0 in | Wt 164.1 lb

## 2011-05-22 DIAGNOSIS — R0789 Other chest pain: Secondary | ICD-10-CM | POA: Insufficient documentation

## 2011-05-22 DIAGNOSIS — I10 Essential (primary) hypertension: Secondary | ICD-10-CM

## 2011-05-22 DIAGNOSIS — R0989 Other specified symptoms and signs involving the circulatory and respiratory systems: Secondary | ICD-10-CM | POA: Insufficient documentation

## 2011-05-22 DIAGNOSIS — R209 Unspecified disturbances of skin sensation: Secondary | ICD-10-CM

## 2011-05-22 DIAGNOSIS — R200 Anesthesia of skin: Secondary | ICD-10-CM

## 2011-05-22 DIAGNOSIS — F17200 Nicotine dependence, unspecified, uncomplicated: Secondary | ICD-10-CM

## 2011-05-22 DIAGNOSIS — J45909 Unspecified asthma, uncomplicated: Secondary | ICD-10-CM

## 2011-05-22 DIAGNOSIS — E559 Vitamin D deficiency, unspecified: Secondary | ICD-10-CM

## 2011-05-22 MED ORDER — HYDROCHLOROTHIAZIDE 25 MG PO TABS: 25.0000 mg | ORAL_TABLET | Freq: Every day | ORAL | Status: DC

## 2011-05-22 MED ORDER — POTASSIUM CHLORIDE ER 10 MEQ PO TBCR: 10.0000 meq | EXTENDED_RELEASE_TABLET | Freq: Two times a day (BID) | ORAL | Status: DC

## 2011-05-22 NOTE — Patient Instructions (Addendum)
F/u as before.  Blood pressure is high, INCREASE DOSE hctz to 25mg  ONE daily, OK to take 12.5 mg Tabs tWO daily , till done.  Labs before next visit as before.\  You are referred for an [place] of your neck to ensure no blockage present .  EKG is normal.  You need to stop smoking.  You need to take one baby aspirin once daily to reduce your risk of stroke.  You will be given info on warning signs of a stroke, if this happens go to the Ed. Your neurologic exam is normal at this visit

## 2011-05-22 NOTE — Assessment & Plan Note (Addendum)
Uncontrolled , additional med to be started 

## 2011-05-22 NOTE — Progress Notes (Signed)
  Subjective:    Patient ID: Paula Best, female    DOB: 1955/08/12, 56 y.o.   MRN: [id number]  HPI Pt states she woke this morning and experienced numbness in her feet and hands, about 3 minutes. No weakness.This has occurred in the past also No blurred vision or difficulty with speech, has had mild headache and continues to smoke. Denies any recent fever or chills   Review of Systems See HPI Denies recent fever or chills. Denies sinus pressure, nasal congestion, ear pain or sore throat. Denies chest congestion, productive cough or wheezing. Denies chest pains, palpitations and leg swelling Denies abdominal pain, nausea, vomiting,diarrhea or constipation.   Denies dysuria, frequency, hesitancy or incontinence. Denies joint pain, swelling and limitation in mobility. Denies  seizures, or weakness Denies depression, anxiety or insomnia. Denies skin break down or rash.        Objective:   Physical Exam  Patient alert and oriented and in no cardiopulmonary distress.  HEENT: No facial asymmetry, EOMI, no sinus tenderness,  oropharynx pink and moist.  Neck supple no adenopathy.Bruit Chest: Clear to auscultation bilaterally.Decreased air entry  CVS: S1, S2 no murmurs, no S3.  ABD: Soft non tender. Bowel sounds normal.  Ext: No edema  MS: Adequate ROM spine, shoulders, hips and knees.  Skin: Intact, no ulcerations or rash noted.  Psych: Good eye contact, normal affect. Memory intact not anxious or depressed appearing.  CNS: CN 2-12 intact, power, tone and sensation normal throughout.       Assessment & Plan:

## 2011-05-24 ENCOUNTER — Ambulatory Visit (HOSPITAL_COMMUNITY): Payer: BC Managed Care – PPO

## 2011-05-26 DIAGNOSIS — R200 Anesthesia of skin: Secondary | ICD-10-CM | POA: Insufficient documentation

## 2011-05-26 NOTE — Assessment & Plan Note (Signed)
Currently stable, no recent flares 

## 2011-05-26 NOTE — Assessment & Plan Note (Signed)
ekg in office NSR, no ischemic changes,

## 2011-05-26 NOTE — Assessment & Plan Note (Signed)
Cessation counseling done, no quit date set 

## 2011-05-26 NOTE — Assessment & Plan Note (Signed)
Importance of correction with weekly supplement discussed

## 2011-05-26 NOTE — Assessment & Plan Note (Signed)
Brief symptom which had entirely resolved at visit. Importance of smoking cessation, blood pressure control and low fat diet stressed, to reduce stroke risk

## 2011-05-26 NOTE — Assessment & Plan Note (Signed)
Will refer for doppler study

## 2011-06-25 ENCOUNTER — Encounter: Payer: Self-pay | Admitting: Family Medicine

## 2011-07-02 ENCOUNTER — Other Ambulatory Visit: Payer: Self-pay | Admitting: Family Medicine

## 2011-07-02 DIAGNOSIS — IMO0002 Reserved for concepts with insufficient information to code with codable children: Secondary | ICD-10-CM

## 2011-07-02 DIAGNOSIS — Z139 Encounter for screening, unspecified: Secondary | ICD-10-CM

## 2011-07-09 ENCOUNTER — Ambulatory Visit (HOSPITAL_COMMUNITY): Payer: BC Managed Care – PPO

## 2011-07-18 ENCOUNTER — Ambulatory Visit: Payer: BC Managed Care – PPO | Admitting: Family Medicine

## 2011-07-23 ENCOUNTER — Other Ambulatory Visit: Payer: Self-pay | Admitting: Family Medicine

## 2011-07-25 ENCOUNTER — Ambulatory Visit (HOSPITAL_COMMUNITY)
Admission: RE | Admit: 2011-07-25 | Discharge: 2011-07-25 | Disposition: A | Discharge status: Home / Self Care | Payer: BC Managed Care – PPO | Source: Ambulatory Visit | Attending: Family Medicine | Admitting: Family Medicine

## 2011-07-25 DIAGNOSIS — N63 Unspecified lump in unspecified breast: Secondary | ICD-10-CM | POA: Insufficient documentation

## 2011-07-25 DIAGNOSIS — IMO0002 Reserved for concepts with insufficient information to code with codable children: Secondary | ICD-10-CM

## 2011-08-28 ENCOUNTER — Encounter: Payer: Self-pay | Admitting: Family Medicine

## 2011-08-28 ENCOUNTER — Ambulatory Visit (INDEPENDENT_AMBULATORY_CARE_PROVIDER_SITE_OTHER): Payer: BC Managed Care – PPO | Admitting: Family Medicine

## 2011-08-28 VITALS — BP 110/80 | HR 87 | Resp 16 | Ht 62.0 in | Wt 158.4 lb

## 2011-08-28 DIAGNOSIS — R0989 Other specified symptoms and signs involving the circulatory and respiratory systems: Secondary | ICD-10-CM

## 2011-08-28 DIAGNOSIS — E559 Vitamin D deficiency, unspecified: Secondary | ICD-10-CM

## 2011-08-28 DIAGNOSIS — D179 Benign lipomatous neoplasm, unspecified: Secondary | ICD-10-CM

## 2011-08-28 DIAGNOSIS — R200 Anesthesia of skin: Secondary | ICD-10-CM

## 2011-08-28 DIAGNOSIS — F17200 Nicotine dependence, unspecified, uncomplicated: Secondary | ICD-10-CM

## 2011-08-28 DIAGNOSIS — R7301 Impaired fasting glucose: Secondary | ICD-10-CM

## 2011-08-28 DIAGNOSIS — R0789 Other chest pain: Secondary | ICD-10-CM

## 2011-08-28 DIAGNOSIS — R209 Unspecified disturbances of skin sensation: Secondary | ICD-10-CM

## 2011-08-28 DIAGNOSIS — J45909 Unspecified asthma, uncomplicated: Secondary | ICD-10-CM

## 2011-08-28 DIAGNOSIS — I10 Essential (primary) hypertension: Secondary | ICD-10-CM

## 2011-08-28 MED ORDER — POTASSIUM CHLORIDE ER 10 MEQ PO TBCR: 10.0000 meq | EXTENDED_RELEASE_TABLET | Freq: Two times a day (BID) | ORAL | Status: DC

## 2011-08-28 NOTE — Progress Notes (Signed)
  Subjective:    Patient ID: Paula Best, female    DOB: Jan 02, 1956, 56 y.o.   MRN: [id number]  HPI The PT is here for follow up and re-evaluation of chronic medical conditions, medication management and review of any available recent lab and radiology data.  Preventive health is updated, specifically  Cancer screening and Immunization.   Questions or concerns regarding consultations or procedures which the PT has had in the interim are  addressed. The PT denies any adverse reactions to current medications since the last visit.  C/o ongoing upper back pain which she believes started ever since she had a lipoma removed one year ago. Still experiencing non specific generalized numbness, with no weakness. Has not yet had her carotid doppler , needs this No commitment to smoking cessation though risks re iterated     Review of Systems See HPI Denies recent fever or chills. Denies sinus pressure, nasal congestion, ear pain or sore throat. Denies chest congestion, productive cough or wheezing. Denies  palpitations and leg swelling Denies abdominal pain, nausea, vomiting,diarrhea or constipation.   Denies dysuria, frequency, hesitancy or incontinence. Denies joint pain, swelling and limitation in mobility. Denies headaches, seizures,  Denies depression,has mild  anxiety denies  insomnia. Denies skin break down or rash.        Objective:   Physical Exam  Patient alert and oriented and in no cardiopulmonary distress.  HEENT: No facial asymmetry, EOMI, no sinus tenderness,  oropharynx pink and moist.  Neck supple no adenopathy.carotid bruit  Chest: Clear to auscultation bilaterally.decresed air entry throughout, tender over upper left back  CVS: S1, S2 no murmurs, no S3.  ABD: Soft non tender. Bowel sounds normal.  Ext: No edema  MS: Adequate ROM spine, shoulders, hips and knees.  Skin: Intact, no ulcerations or rash noted.  Psych: Good eye contact, normal affect.  Memory intact  anxious not  depressed appearing.  CNS: CN 2-12 intact, power, tone and sensation normal throughout.       Assessment & Plan:

## 2011-08-28 NOTE — Patient Instructions (Addendum)
F/u in 4 month  Your blood pressure is good, please continue current medication.  You need a scan of your chest since you c/o pain and have a long history of smoking.If this is normal I may need to do an MRI of your mid back  You need to stop smoking  HBA1C today and chem 7, remember you may develop diabetes if you do not cut back on sweets and carbohydrates

## 2011-08-29 LAB — BASIC METABOLIC PANEL
BUN: 10 mg/dL (ref 6–23)
CO2: 29 mEq/L (ref 19–32)
Calcium: 9.1 mg/dL (ref 8.4–10.5)
Chloride: 104 mEq/L (ref 96–112)
Creat: 0.77 mg/dL (ref 0.50–1.10)
Glucose, Bld: 69 mg/dL — ABNORMAL LOW (ref 70–99)

## 2011-09-02 DIAGNOSIS — E1169 Type 2 diabetes mellitus with other specified complication: Secondary | ICD-10-CM | POA: Insufficient documentation

## 2011-09-02 DIAGNOSIS — R7301 Impaired fasting glucose: Secondary | ICD-10-CM | POA: Insufficient documentation

## 2011-09-02 DIAGNOSIS — R7303 Prediabetes: Secondary | ICD-10-CM | POA: Insufficient documentation

## 2011-09-02 NOTE — Assessment & Plan Note (Signed)
Controlled, no change in medication  

## 2011-09-02 NOTE — Assessment & Plan Note (Signed)
Ongoing use counseled re the increased risk of cancer and cardiovascular disease and the need to quit smoking,  No commitment made to do so

## 2011-09-02 NOTE — Assessment & Plan Note (Signed)
Importance of taking prescribed supplements till value within normal is stressed

## 2011-09-02 NOTE — Assessment & Plan Note (Signed)
Removed from upper posterior chest, relates some of her pain symptoms to the procedure

## 2011-09-02 NOTE — Assessment & Plan Note (Signed)
ongpoing chest pain with over 40 pack year history of nicotine

## 2011-09-02 NOTE — Assessment & Plan Note (Signed)
Carotid doppler still not done pt needs to do this

## 2011-09-02 NOTE — Assessment & Plan Note (Signed)
Improved HBa1C

## 2011-09-02 NOTE — Assessment & Plan Note (Signed)
Generalized numbness reported to be intermittent x 3 months, denies weakness or family h/o neurologic disease

## 2011-09-12 ENCOUNTER — Ambulatory Visit (HOSPITAL_COMMUNITY)
Admission: RE | Admit: 2011-09-12 | Discharge: 2011-09-12 | Disposition: A | Discharge status: Home / Self Care | Payer: BC Managed Care – PPO | Source: Ambulatory Visit | Attending: Family Medicine | Admitting: Family Medicine

## 2011-09-12 DIAGNOSIS — R079 Chest pain, unspecified: Secondary | ICD-10-CM | POA: Insufficient documentation

## 2011-09-12 DIAGNOSIS — R0789 Other chest pain: Secondary | ICD-10-CM

## 2011-09-12 MED ORDER — IOHEXOL 300 MG/ML  SOLN
100.0000 mL | Freq: Once | INTRAMUSCULAR | Status: AC | PRN
  Administered 2011-09-12: 100 mL via INTRAVENOUS

## 2011-10-24 ENCOUNTER — Encounter: Payer: Self-pay | Admitting: Family Medicine

## 2011-10-24 ENCOUNTER — Ambulatory Visit (INDEPENDENT_AMBULATORY_CARE_PROVIDER_SITE_OTHER): Payer: BC Managed Care – PPO | Admitting: Family Medicine

## 2011-10-24 VITALS — BP 152/90 | HR 68 | Resp 16 | Ht 62.0 in | Wt 164.8 lb

## 2011-10-24 DIAGNOSIS — F17200 Nicotine dependence, unspecified, uncomplicated: Secondary | ICD-10-CM

## 2011-10-24 DIAGNOSIS — R200 Anesthesia of skin: Secondary | ICD-10-CM

## 2011-10-24 DIAGNOSIS — M79604 Pain in right leg: Secondary | ICD-10-CM | POA: Insufficient documentation

## 2011-10-24 DIAGNOSIS — R7301 Impaired fasting glucose: Secondary | ICD-10-CM

## 2011-10-24 DIAGNOSIS — I10 Essential (primary) hypertension: Secondary | ICD-10-CM

## 2011-10-24 DIAGNOSIS — M5090 Cervical disc disorder, unspecified, unspecified cervical region: Secondary | ICD-10-CM | POA: Insufficient documentation

## 2011-10-24 DIAGNOSIS — R209 Unspecified disturbances of skin sensation: Secondary | ICD-10-CM

## 2011-10-24 DIAGNOSIS — M545 Low back pain, unspecified: Secondary | ICD-10-CM

## 2011-10-24 MED ORDER — GABAPENTIN 300 MG PO CAPS: ORAL_CAPSULE | ORAL | Status: DC

## 2011-10-24 NOTE — Patient Instructions (Addendum)
CPE in October as before.  You are referred  To neurology as well as for mRI of your neck and low back  Blood pressure is high today.  Please work on weight loss  Please cut back cigarettes , you need to quit  HBA1C and fasting chem 7 in October before visit

## 2011-10-24 NOTE — Progress Notes (Signed)
  Subjective:    Patient ID: Paula Best, female    DOB: 1955-11-17, 56 y.o.   MRN: [id number]  HPI tPt in with c/o ongoing debilitating pain in right shoulder, left has improved, also, numbness down both hands and also numbness in the feet. C/o back pain when sweeping  Or after being on her feet for a while.Denies weakness of the extremities. Denies incontinence of stool or urine. Pt continues to smoke and is unwilling to set a quit date at this time. States her nerves are strung out with the pain and debility, and numbness, wants this addresed before she can commit to lifestyle changes needed   Review of Systems See HPI Denies recent fever or chills. Denies sinus pressure, nasal congestion, ear pain or sore throat. Denies chest congestion, productive cough or wheezing. Denies chest pains, palpitations and leg swelling Denies abdominal pain, nausea, vomiting,diarrhea or constipation.   Denies dysuria, frequency, hesitancy or incontinence.  . Denies skin break down or rash.        Objective:   Physical Exam  Patient alert and oriented and in no cardiopulmonary distress.  HEENT: No facial asymmetry, EOMI, no sinus tenderness,  oropharynx pink and moist.  Neck  Decreased ROM, with right trapezius spasm, no JVD, no adenopathy.  Chest: Clear to auscultation bilaterally.Decreased air entry throughout  CVS: S1, S2 no murmurs, no S3.  ABD: Soft non tender. Bowel sounds normal.  Ext: No edema  [id number]  ROM lumbo sacral  Spine and right  Shoulder,adequate in  hips and knees.  Skin: Intact, no ulcerations or rash noted.  Psych: Good eye contact, normal affect. Memory intact  anxious not depressed appearing.  CNS: CN 2-12 intact, grade 3 power in upper extremities, decreased sensation in lower extremities     Assessment & Plan:

## 2011-10-25 NOTE — Assessment & Plan Note (Signed)

## 2011-10-25 NOTE — Assessment & Plan Note (Signed)
Uncontrolled, elevated at [facility] visit. DASH diet and commitment to daily physical activity for a minimum of 30 minutes discussed and encouraged, as a part of hypertension management. The importance of attaining a healthy weight is also discussed.

## 2011-10-25 NOTE — Assessment & Plan Note (Signed)
Lifestyle change to avert diabetes discussed and encouraged. Commitment to 30 minutes of physical activity daily. Limitation in carbohydrate intake to 30 to 45 grams per meal. Commitment to attaing a healthy weight.  Marland Kitchen

## 2011-10-25 NOTE — Assessment & Plan Note (Signed)
worsening symptoms reported, will send for MRI spine, also needs neurology eval

## 2011-10-25 NOTE — Assessment & Plan Note (Signed)
Refer to neurology for formal testing due to increased debility

## 2011-10-25 NOTE — Assessment & Plan Note (Signed)
Pt has failed trial of anti inflammatories with worsening symptoms, needs MRI neck to eval  For nerve damage

## 2011-10-31 ENCOUNTER — Ambulatory Visit (HOSPITAL_COMMUNITY): Admission: RE | Admit: 2011-10-31 | Payer: BC Managed Care – PPO | Source: Ambulatory Visit

## 2011-10-31 ENCOUNTER — Telehealth: Payer: Self-pay | Admitting: Family Medicine

## 2011-10-31 ENCOUNTER — Other Ambulatory Visit (HOSPITAL_COMMUNITY): Payer: BC Managed Care – PPO

## 2011-10-31 ENCOUNTER — Ambulatory Visit (HOSPITAL_COMMUNITY): Payer: BC Managed Care – PPO

## 2011-10-31 DIAGNOSIS — I10 Essential (primary) hypertension: Secondary | ICD-10-CM

## 2011-10-31 MED ORDER — AMLODIPINE BESYLATE 10 MG PO TABS: ORAL_TABLET | ORAL | Status: DC

## 2011-10-31 MED ORDER — HYDROCHLOROTHIAZIDE 25 MG PO TABS: 25.0000 mg | ORAL_TABLET | Freq: Every day | ORAL | Status: DC

## 2011-10-31 NOTE — Telephone Encounter (Signed)
Sent in

## 2011-11-05 ENCOUNTER — Ambulatory Visit (HOSPITAL_COMMUNITY)
Admission: RE | Admit: 2011-11-05 | Discharge: 2011-11-05 | Disposition: A | Discharge status: Home / Self Care | Payer: BC Managed Care – PPO | Source: Ambulatory Visit | Attending: Family Medicine | Admitting: Family Medicine

## 2011-11-05 DIAGNOSIS — M5020 Other cervical disc displacement, unspecified cervical region: Secondary | ICD-10-CM | POA: Insufficient documentation

## 2011-11-05 DIAGNOSIS — M5090 Cervical disc disorder, unspecified, unspecified cervical region: Secondary | ICD-10-CM

## 2011-11-05 DIAGNOSIS — R209 Unspecified disturbances of skin sensation: Secondary | ICD-10-CM | POA: Insufficient documentation

## 2011-11-05 DIAGNOSIS — M79604 Pain in right leg: Secondary | ICD-10-CM

## 2011-11-05 DIAGNOSIS — M5030 Other cervical disc degeneration, unspecified cervical region: Secondary | ICD-10-CM | POA: Insufficient documentation

## 2011-11-05 DIAGNOSIS — M542 Cervicalgia: Secondary | ICD-10-CM | POA: Insufficient documentation

## 2011-11-05 DIAGNOSIS — M545 Low back pain: Secondary | ICD-10-CM

## 2011-11-06 ENCOUNTER — Other Ambulatory Visit: Payer: Self-pay | Admitting: Family Medicine

## 2011-11-06 DIAGNOSIS — N83209 Unspecified ovarian cyst, unspecified side: Secondary | ICD-10-CM

## 2011-11-06 DIAGNOSIS — M5090 Cervical disc disorder, unspecified, unspecified cervical region: Secondary | ICD-10-CM

## 2011-11-13 ENCOUNTER — Ambulatory Visit (HOSPITAL_COMMUNITY): Payer: BC Managed Care – PPO

## 2011-11-19 ENCOUNTER — Ambulatory Visit (HOSPITAL_COMMUNITY)
Admission: RE | Admit: 2011-11-19 | Discharge: 2011-11-19 | Disposition: A | Discharge status: Home / Self Care | Payer: BC Managed Care – PPO | Source: Ambulatory Visit | Attending: Family Medicine | Admitting: Family Medicine

## 2011-11-19 DIAGNOSIS — N83209 Unspecified ovarian cyst, unspecified side: Secondary | ICD-10-CM

## 2011-11-19 DIAGNOSIS — D259 Leiomyoma of uterus, unspecified: Secondary | ICD-10-CM | POA: Insufficient documentation

## 2011-11-19 DIAGNOSIS — R9389 Abnormal findings on diagnostic imaging of other specified body structures: Secondary | ICD-10-CM | POA: Insufficient documentation

## 2011-11-21 ENCOUNTER — Encounter: Payer: Self-pay | Admitting: Family Medicine

## 2011-11-21 DIAGNOSIS — N83209 Unspecified ovarian cyst, unspecified side: Secondary | ICD-10-CM | POA: Insufficient documentation

## 2011-12-13 IMAGING — MG MM DIGITAL SCREENING
4 series · 4 of 4 positions shown · non-contrast
Comparison: none

DG SCREEN MAMMOGRAM BILATERAL
Bilateral CC and MLO view(s) were taken.

DIGITAL SCREENING MAMMOGRAM WITH CAD:
The breast tissue is heterogeneously dense.  No masses or malignant type calcifications are 
identified.  Compared with prior studies.
Images were processed with CAD.

[L CC]
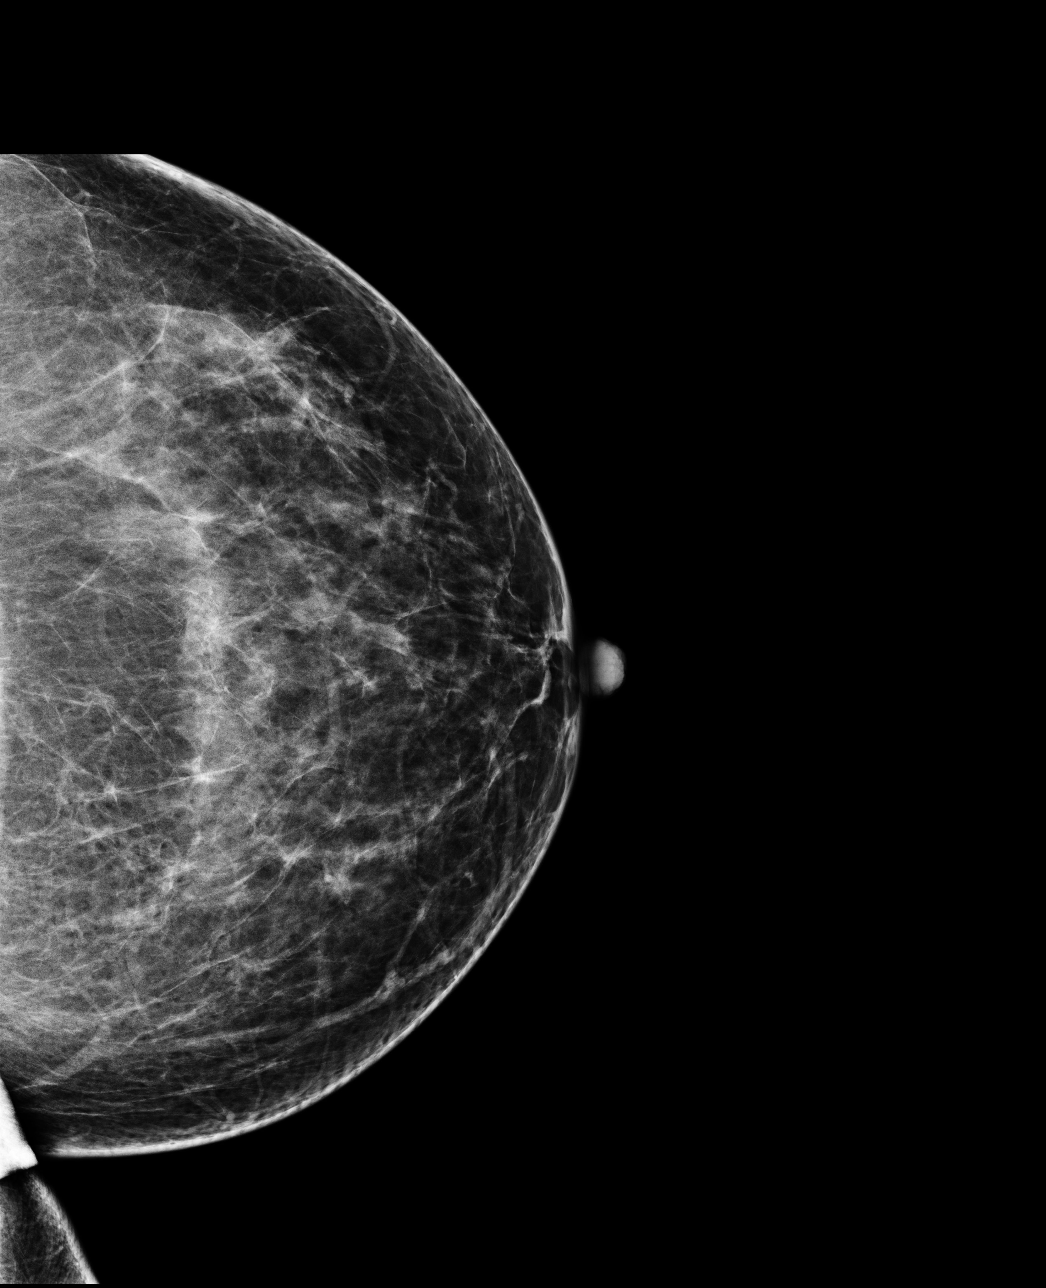

[L MLO]
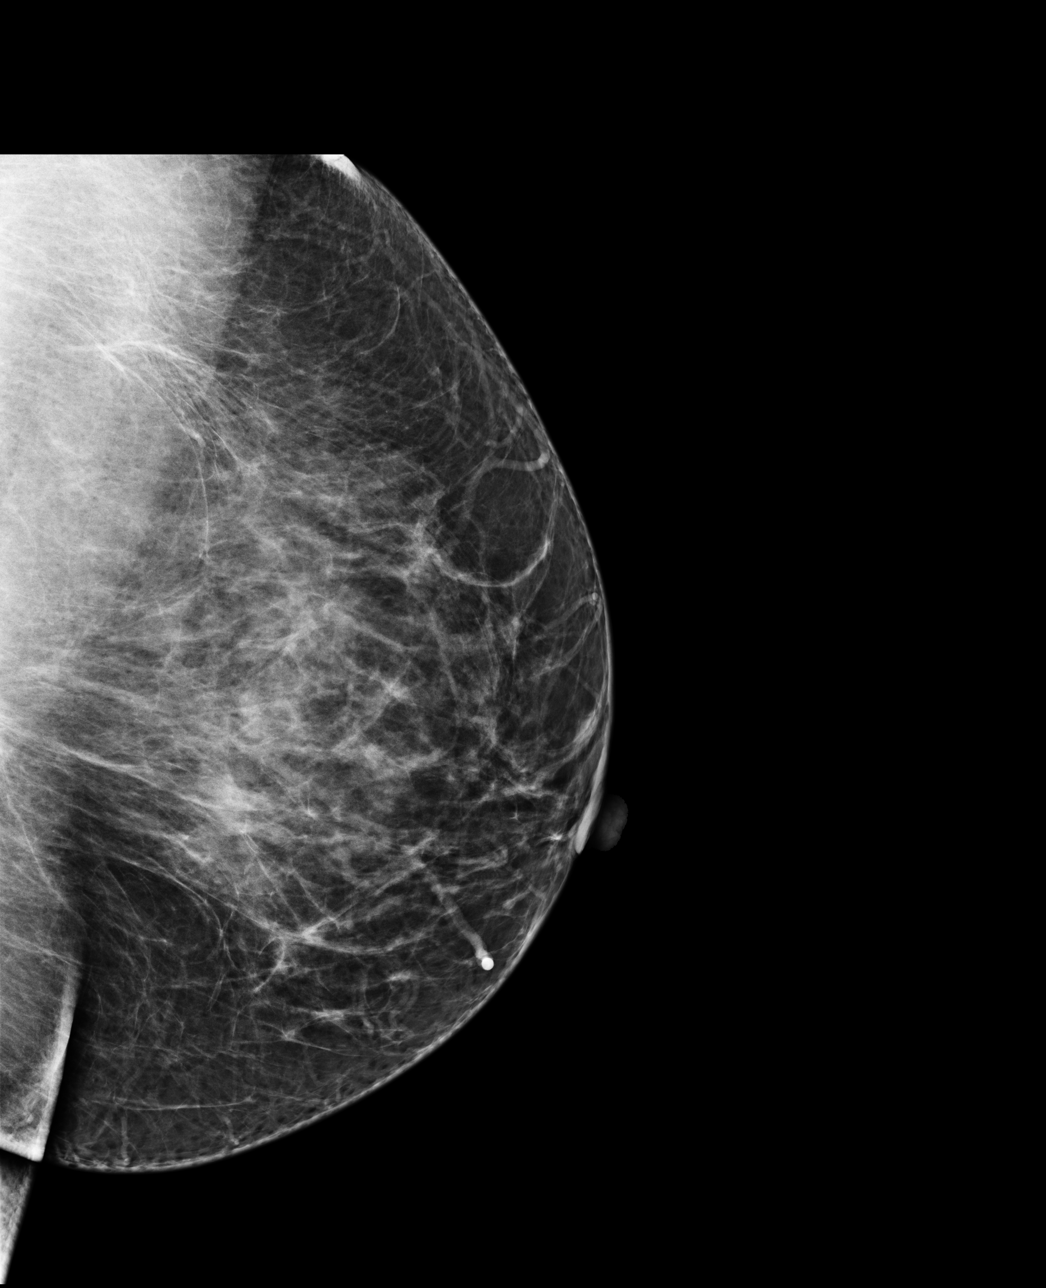

[R CC]
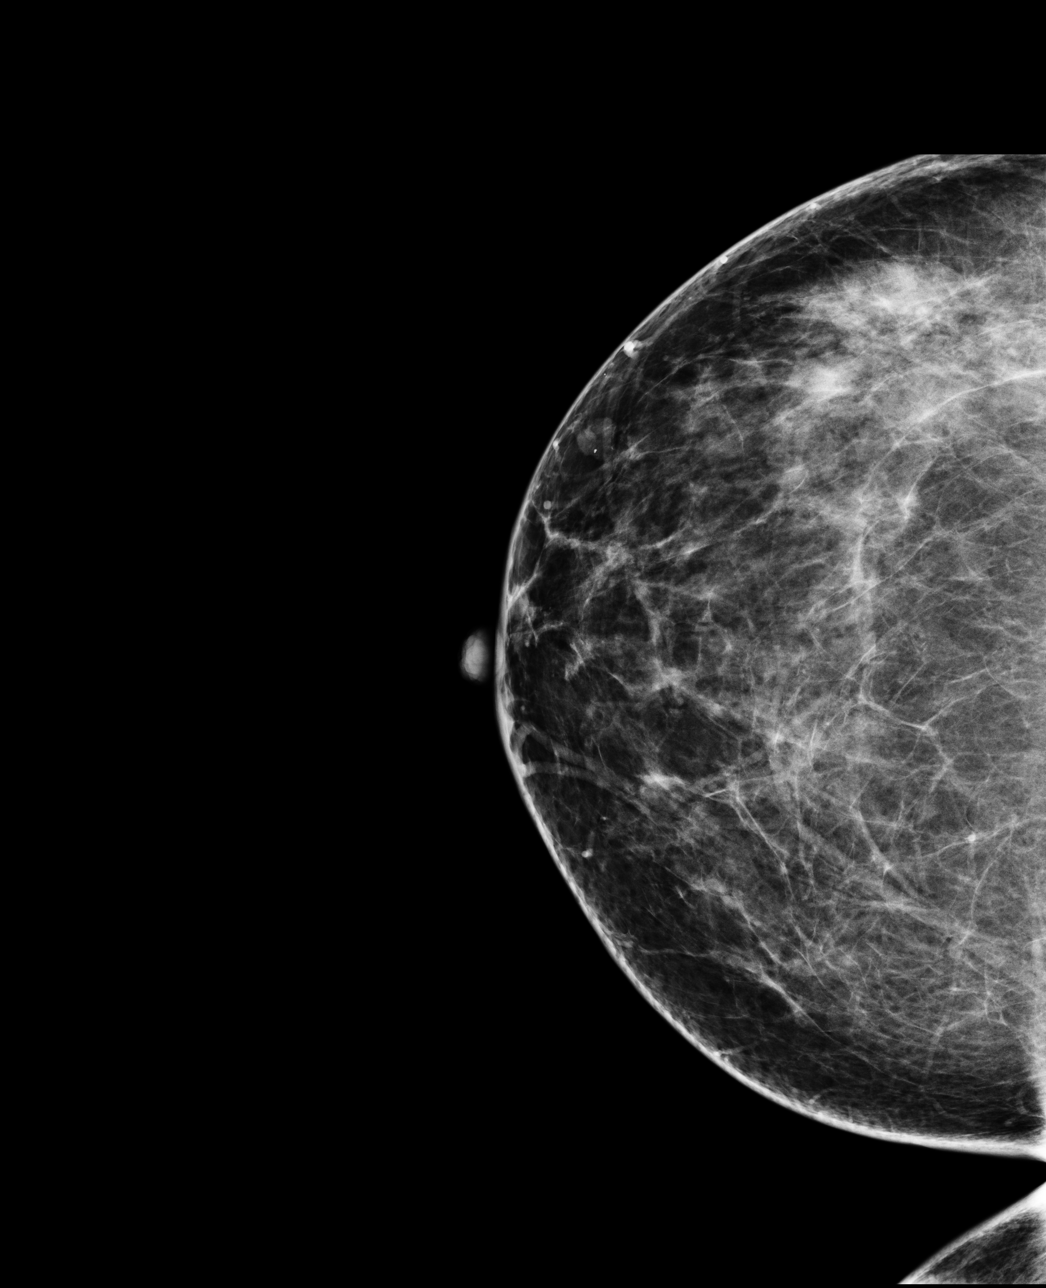

[R MLO]
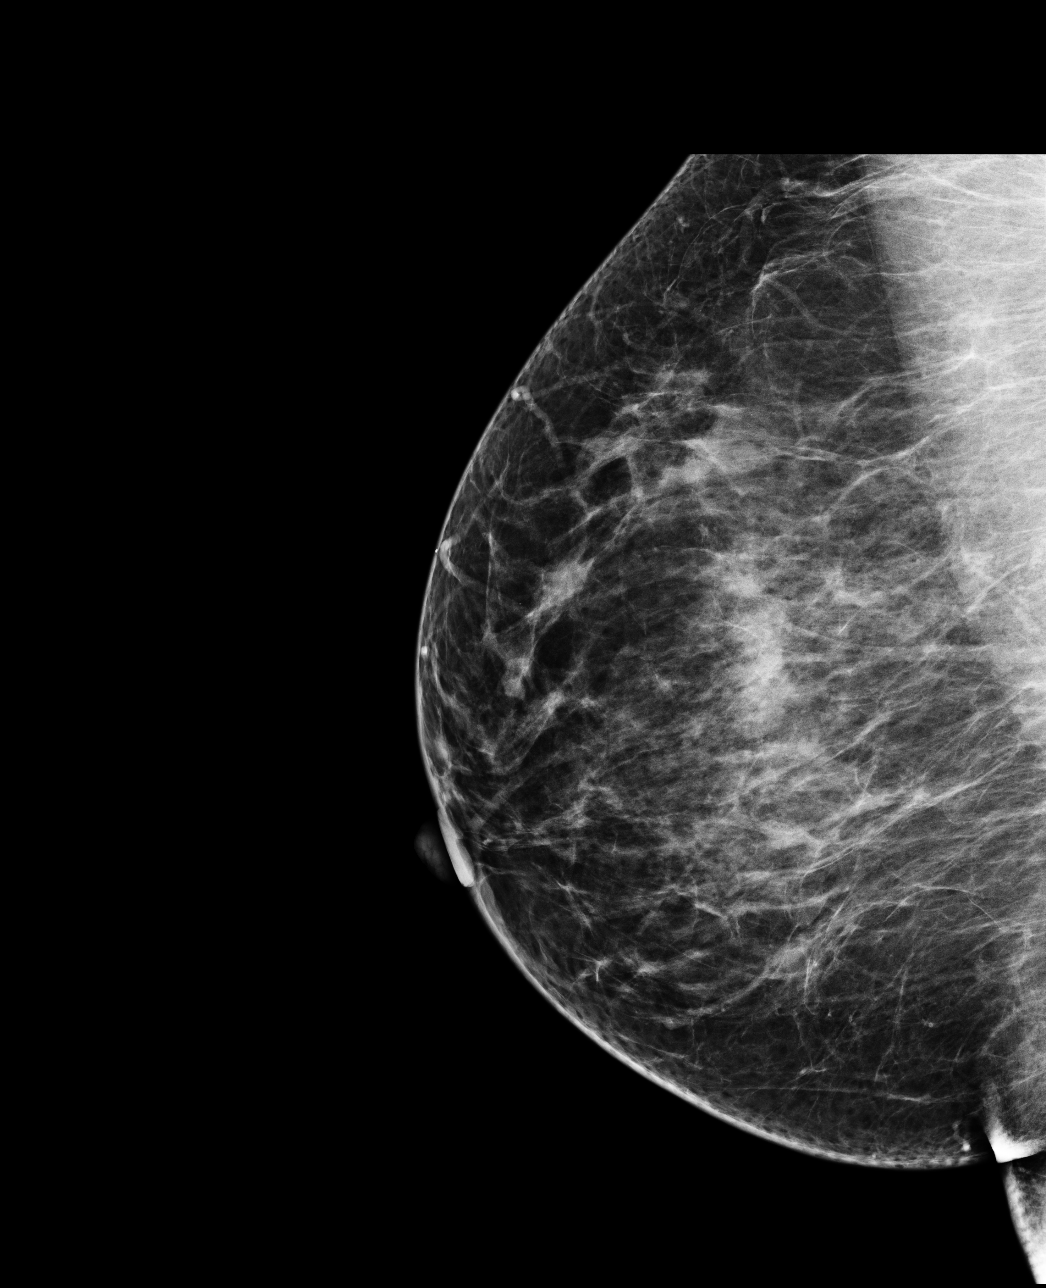

[4 of 4 positions shown; findings below may reference images not displayed]

IMPRESSION: No specific mammographic evidence of malignancy.  Next screening mammogram is recommended in one 
year.

A result letter of this screening mammogram will be mailed directly to the patient.

ASSESSMENT: Negative - BI-RADS 1

Screening mammogram in 1 year.
,

## 2011-12-27 ENCOUNTER — Ambulatory Visit: Payer: BC Managed Care – PPO | Admitting: Family Medicine

## 2012-01-22 ENCOUNTER — Encounter: Payer: Self-pay | Admitting: Family Medicine

## 2012-01-22 ENCOUNTER — Ambulatory Visit (INDEPENDENT_AMBULATORY_CARE_PROVIDER_SITE_OTHER): Payer: BC Managed Care – PPO | Admitting: Family Medicine

## 2012-01-22 VITALS — BP 126/68 | HR 67 | Resp 18 | Ht 62.0 in | Wt 163.0 lb

## 2012-01-22 DIAGNOSIS — I10 Essential (primary) hypertension: Secondary | ICD-10-CM

## 2012-01-22 DIAGNOSIS — J45909 Unspecified asthma, uncomplicated: Secondary | ICD-10-CM

## 2012-01-22 DIAGNOSIS — R7301 Impaired fasting glucose: Secondary | ICD-10-CM

## 2012-01-22 DIAGNOSIS — R7303 Prediabetes: Secondary | ICD-10-CM

## 2012-01-22 DIAGNOSIS — F17200 Nicotine dependence, unspecified, uncomplicated: Secondary | ICD-10-CM

## 2012-01-22 DIAGNOSIS — M549 Dorsalgia, unspecified: Secondary | ICD-10-CM

## 2012-01-22 NOTE — Assessment & Plan Note (Signed)
Doing well, advised to restart neurontin if symptoms restart, she tells me she has a bad habit of stopping meds

## 2012-01-22 NOTE — Assessment & Plan Note (Signed)
She is trying to cut back, down to 1ppd

## 2012-01-22 NOTE — Assessment & Plan Note (Signed)
Recheck A1C and lytes

## 2012-01-22 NOTE — Patient Instructions (Addendum)
Start back the gabapentin at bedtime if needed  Get the labs done today Continue all other medications F/U 4 months - Dr. Lodema Hong

## 2012-01-22 NOTE — Assessment & Plan Note (Addendum)
Doing well, rare use of inhaler, Flu shot declined

## 2012-01-22 NOTE — Progress Notes (Signed)
  Subjective:    Patient ID: Paula Best, female    DOB: 1955/12/20, 56 y.o.   MRN: [id number]  HPI Pt here to f/u chronic medical problems. No particular concerns, MRI reviewed with pt about back pain which has improved, she is no longer using gabapentin. Tolerating BP meds Asthma- rare use of inhaler  Review of Systems  GEN- denies fatigue, fever, weight loss,weakness, recent illness HEENT- denies eye drainage, change in vision, nasal discharge, CVS- denies chest pain, palpitations RESP- denies SOB, cough, wheeze ABD- denies N/V, change in stools, abd pain GU- denies dysuria, hematuria, dribbling, incontinence MSK- denies joint pain, muscle aches, injury Neuro- denies headache, dizziness, syncope, seizure activity      Objective:   Physical Exam GEN- NAD, alert and oriented x3 HEENT- PERRL, EOMI, non injected sclera, pink conjunctiva, MMM, oropharynx clear Neck- Supple,  CVS- RRR, no murmur RESP-CTAB ABS-NABS,soft,NT,ND EXT- No edema Pulses- Radial, DP- 2+        Assessment & Plan:

## 2012-01-22 NOTE — Assessment & Plan Note (Signed)
Well controlled, no change to meds 

## 2012-03-04 ENCOUNTER — Other Ambulatory Visit: Payer: Self-pay | Admitting: Family Medicine

## 2012-04-05 ENCOUNTER — Other Ambulatory Visit: Payer: Self-pay | Admitting: Family Medicine

## 2012-05-07 ENCOUNTER — Other Ambulatory Visit: Payer: Self-pay | Admitting: Family Medicine

## 2012-05-13 ENCOUNTER — Encounter: Payer: Self-pay | Admitting: Family Medicine

## 2012-05-13 ENCOUNTER — Ambulatory Visit (INDEPENDENT_AMBULATORY_CARE_PROVIDER_SITE_OTHER): Payer: BC Managed Care – PPO | Admitting: Family Medicine

## 2012-05-13 VITALS — BP 120/82 | HR 86 | Resp 18 | Ht 62.0 in | Wt 165.0 lb

## 2012-05-13 DIAGNOSIS — I10 Essential (primary) hypertension: Secondary | ICD-10-CM

## 2012-05-13 DIAGNOSIS — R7303 Prediabetes: Secondary | ICD-10-CM

## 2012-05-13 DIAGNOSIS — R7301 Impaired fasting glucose: Secondary | ICD-10-CM

## 2012-05-13 DIAGNOSIS — J45909 Unspecified asthma, uncomplicated: Secondary | ICD-10-CM

## 2012-05-13 DIAGNOSIS — N83209 Unspecified ovarian cyst, unspecified side: Secondary | ICD-10-CM

## 2012-05-13 DIAGNOSIS — R7309 Other abnormal glucose: Secondary | ICD-10-CM

## 2012-05-13 DIAGNOSIS — Z13220 Encounter for screening for lipoid disorders: Secondary | ICD-10-CM

## 2012-05-13 DIAGNOSIS — F17200 Nicotine dependence, unspecified, uncomplicated: Secondary | ICD-10-CM

## 2012-05-13 DIAGNOSIS — E559 Vitamin D deficiency, unspecified: Secondary | ICD-10-CM

## 2012-05-13 NOTE — Patient Instructions (Addendum)
F/u in 5 month, please call if you need me before  Please work on smoking cessation, commitment to daily physical acitivity ,a nd weight loss, you are prediabetic.  You are referred to gynecology for further evalaution of the cyst on the ovary and because you continue to have regular menses.  Please get fasting lipid, chem 7, HBA1C, TSH, cbc and vit d levels in the next 1 week, they are past due

## 2012-05-18 NOTE — Assessment & Plan Note (Signed)
Unchanged, not motivated to quit. Patient counseled for approximately 5 minutes regarding the health risks of ongoing nicotine use, specifically all types of cancer, heart disease, stroke and respiratory failure. The options available for help with cessation ,the behavioral changes to assist the process, and the option to either gradully reduce usage  Or abruptly stop.is also discussed. Pt is also encouraged to set specific goals in number of cigarettes used daily, as well as to set a quit date.

## 2012-05-18 NOTE — Assessment & Plan Note (Signed)
Updated lab needed Patient educated about the importance of limiting  Carbohydrate intake , the need to commit to daily physical activity for a minimum of 30 minutes , and to commit weight loss. The fact that changes in all these areas will reduce or eliminate all together the development of diabetes is stressed.    

## 2012-05-18 NOTE — Progress Notes (Signed)
  Subjective:    Patient ID: Paula Best, female    DOB: 03-31-1955, 57 y.o.   MRN: [id number]  HPI The PT is here for follow up and re-evaluation of chronic medical conditions, medication management and review of any available recent lab and radiology data.  Preventive health is updated, specifically  Cancer screening and Immunization.   Questions or concerns regarding consultations or procedures which the PT has had in the interim are  addressed. The PT denies any adverse reactions to current medications since the last visit.  There are no new concerns.  There are no specific complaints       Review of Systems See HPI Denies recent fever or chills. Denies sinus pressure, nasal congestion, ear pain or sore throat. Denies chest congestion, productive cough or wheezing. Denies chest pains, palpitations and leg swelling Denies abdominal pain, nausea, vomiting,diarrhea or constipation.   Denies dysuria, frequency, hesitancy or incontinence. Denies joint pain, swelling and limitation in mobility. Denies headaches, seizures, numbness, or tingling. Denies depression, anxiety or insomnia. Denies skin break down or rash.        Objective:   Physical Exam Patient alert and oriented and in no cardiopulmonary distress.  HEENT: No facial asymmetry, EOMI, no sinus tenderness,  oropharynx pink and moist.  Neck supple no adenopathy.  Chest: Clear to auscultation bilaterally.  CVS: S1, S2 no murmurs, no S3.  ABD: Soft non tender. Bowel sounds normal.  Ext: No edema  MS: Adequate ROM spine, shoulders, hips and knees.  Skin: Intact, no ulcerations or rash noted.  Psych: Good eye contact, normal affect. Memory intact not anxious or depressed appearing.  CNS: CN 2-12 intact, power, tone and sensation normal throughout.        Assessment & Plan:

## 2012-05-18 NOTE — Assessment & Plan Note (Addendum)
Controlled, no change in medication DASH diet and commitment to daily physical activity for a minimum of 30 minutes discussed and encouraged, as a part of hypertension management. The importance of attaining a healthy weight is also discussed.  

## 2012-05-18 NOTE — Assessment & Plan Note (Signed)
Controlled, no change in medication  

## 2012-05-18 NOTE — Assessment & Plan Note (Signed)
No c/o pelvic or abdominal pain currently. Needs rept pelvic US in 10/2012

## 2012-05-23 ENCOUNTER — Ambulatory Visit: Payer: BC Managed Care – PPO | Admitting: Family Medicine

## 2012-08-07 ENCOUNTER — Other Ambulatory Visit: Payer: Self-pay | Admitting: Family Medicine

## 2012-08-14 ENCOUNTER — Other Ambulatory Visit: Payer: Self-pay | Admitting: Family Medicine

## 2012-09-09 ENCOUNTER — Other Ambulatory Visit: Payer: Self-pay | Admitting: Family Medicine

## 2012-09-09 DIAGNOSIS — Z139 Encounter for screening, unspecified: Secondary | ICD-10-CM

## 2012-09-22 ENCOUNTER — Ambulatory Visit (HOSPITAL_COMMUNITY)
Admission: RE | Admit: 2012-09-22 | Discharge: 2012-09-22 | Disposition: A | Discharge status: Home / Self Care | Payer: BC Managed Care – PPO | Source: Ambulatory Visit | Attending: Family Medicine | Admitting: Family Medicine

## 2012-09-22 DIAGNOSIS — Z139 Encounter for screening, unspecified: Secondary | ICD-10-CM

## 2012-09-22 DIAGNOSIS — Z1231 Encounter for screening mammogram for malignant neoplasm of breast: Secondary | ICD-10-CM | POA: Insufficient documentation

## 2012-09-30 ENCOUNTER — Other Ambulatory Visit: Payer: Self-pay | Admitting: Family Medicine

## 2012-09-30 DIAGNOSIS — R928 Other abnormal and inconclusive findings on diagnostic imaging of breast: Secondary | ICD-10-CM

## 2012-10-16 ENCOUNTER — Ambulatory Visit: Payer: BC Managed Care – PPO | Admitting: Family Medicine

## 2012-10-16 ENCOUNTER — Encounter: Payer: Self-pay | Admitting: Family Medicine

## 2012-11-10 ENCOUNTER — Encounter: Payer: Self-pay | Admitting: Family Medicine

## 2012-11-12 ENCOUNTER — Telehealth: Payer: Self-pay | Admitting: Family Medicine

## 2012-11-12 DIAGNOSIS — Z79899 Other long term (current) drug therapy: Secondary | ICD-10-CM

## 2012-11-12 DIAGNOSIS — I10 Essential (primary) hypertension: Secondary | ICD-10-CM

## 2012-11-12 DIAGNOSIS — R7301 Impaired fasting glucose: Secondary | ICD-10-CM

## 2012-11-12 DIAGNOSIS — D509 Iron deficiency anemia, unspecified: Secondary | ICD-10-CM

## 2012-11-12 MED ORDER — AMLODIPINE BESYLATE 10 MG PO TABS: ORAL_TABLET | ORAL | Status: DC

## 2012-11-12 MED ORDER — HYDROCHLOROTHIAZIDE 25 MG PO TABS: ORAL_TABLET | ORAL | Status: DC

## 2012-11-12 NOTE — Telephone Encounter (Signed)
Called patient to let her know labs were ordered but no answer. Will mail lab order to her

## 2012-11-18 ENCOUNTER — Encounter: Payer: Self-pay | Admitting: Family Medicine

## 2012-11-18 ENCOUNTER — Other Ambulatory Visit: Payer: Self-pay | Admitting: Family Medicine

## 2012-11-18 ENCOUNTER — Telehealth: Payer: Self-pay | Admitting: Family Medicine

## 2012-11-18 ENCOUNTER — Ambulatory Visit (INDEPENDENT_AMBULATORY_CARE_PROVIDER_SITE_OTHER): Payer: BC Managed Care – PPO | Admitting: Family Medicine

## 2012-11-18 VITALS — BP 110/70 | HR 94 | Resp 18 | Ht 62.0 in | Wt 167.0 lb

## 2012-11-18 DIAGNOSIS — M79604 Pain in right leg: Secondary | ICD-10-CM

## 2012-11-18 DIAGNOSIS — R7301 Impaired fasting glucose: Secondary | ICD-10-CM

## 2012-11-18 DIAGNOSIS — R209 Unspecified disturbances of skin sensation: Secondary | ICD-10-CM

## 2012-11-18 DIAGNOSIS — Z13220 Encounter for screening for lipoid disorders: Secondary | ICD-10-CM

## 2012-11-18 DIAGNOSIS — D509 Iron deficiency anemia, unspecified: Secondary | ICD-10-CM

## 2012-11-18 DIAGNOSIS — R200 Anesthesia of skin: Secondary | ICD-10-CM

## 2012-11-18 DIAGNOSIS — Z1211 Encounter for screening for malignant neoplasm of colon: Secondary | ICD-10-CM

## 2012-11-18 DIAGNOSIS — I10 Essential (primary) hypertension: Secondary | ICD-10-CM

## 2012-11-18 DIAGNOSIS — M545 Low back pain, unspecified: Secondary | ICD-10-CM

## 2012-11-18 DIAGNOSIS — N83209 Unspecified ovarian cyst, unspecified side: Secondary | ICD-10-CM

## 2012-11-18 DIAGNOSIS — F17200 Nicotine dependence, unspecified, uncomplicated: Secondary | ICD-10-CM

## 2012-11-18 DIAGNOSIS — E559 Vitamin D deficiency, unspecified: Secondary | ICD-10-CM

## 2012-11-18 DIAGNOSIS — J45909 Unspecified asthma, uncomplicated: Secondary | ICD-10-CM

## 2012-11-18 DIAGNOSIS — M5090 Cervical disc disorder, unspecified, unspecified cervical region: Secondary | ICD-10-CM

## 2012-11-18 DIAGNOSIS — R928 Other abnormal and inconclusive findings on diagnostic imaging of breast: Secondary | ICD-10-CM

## 2012-11-18 LAB — CBC
Hemoglobin: 13.9 g/dL (ref 12.0–15.0)
MCH: 30.3 pg (ref 26.0–34.0)
Platelets: 358 10*3/uL (ref 150–400)
RBC: 4.59 MIL/uL (ref 3.87–5.11)

## 2012-11-18 MED ORDER — GABAPENTIN 300 MG PO CAPS: ORAL_CAPSULE | ORAL | Status: DC

## 2012-11-18 NOTE — Assessment & Plan Note (Signed)
Unchanged , smokes 1 PPd, willing to consider gradual reduction but not yet deciding to quit Patient counseled for approximately 5 minutes regarding the health risks of ongoing nicotine use, specifically all types of cancer, heart disease, stroke and respiratory failure. The options available for help with cessation ,the behavioral changes to assist the process, and the option to either gradully reduce usage  Or abruptly stop.is also discussed. Pt is also encouraged to set specific goals in number of cigarettes used daily, as well as to set a quit date.

## 2012-11-18 NOTE — Assessment & Plan Note (Signed)
Controlled, no change in medication DASH diet and commitment to daily physical activity for a minimum of 30 minutes discussed and encouraged, as a part of hypertension management. The importance of attaining a healthy weight is also discussed.  

## 2012-11-18 NOTE — Progress Notes (Signed)
  Subjective:    Patient ID: Paula Best, female    DOB: [birth year], 57 y.o.   MRN: [id number]  HPI  The PT is here for follow up and re-evaluation of chronic medical conditions, medication management and review of any available recent lab and radiology data.  Preventive health is updated, specifically  Cancer screening and Immunization. Diagnostic mammogram from recent screening still needs to be done, pt has been avoiding this , but is willing to move forward with this now. Gets adequate relief from lower extremity pain with gabapentin. Still unwilling to set quit date but will start cutting back. Current is 1 PPD . The PT denies any adverse reactions to current medications since the last visit.  There are no new concerns.  There are no specific complaints . Uses albuterol inhaler very intermittently     Review of Systems See HPI Denies recent fever or chills. Denies sinus pressure, nasal congestion, ear pain or sore throat. Denies chest congestion, productive cough or wheezing. Denies chest pains, palpitations and leg swelling Denies abdominal pain, nausea, vomiting,diarrhea or constipation.   Denies dysuria, frequency, hesitancy or incontinence. Denies joint pain, swelling and limitation in mobility. Denies headaches, seizures, numbness, or tingling. Denies depression, anxiety or insomnia. Denies skin break down or rash.        Objective:   Physical Exam  Patient alert and oriented and in no cardiopulmonary distress.  HEENT: No facial asymmetry, EOMI, no sinus tenderness,  oropharynx pink and moist.  Neck supple no adenopathy.  Chest: Clear to auscultation bilaterally.Decreased air entry  CVS: S1, S2 no murmurs, no S3.  ABD: Soft non tender. Bowel sounds normal. Rectal : no mass , heme negative stool Ext: No edema  MS: Adequate ROM spine, shoulders, hips and knees.  Skin: Intact, no ulcerations or rash noted.  Psych: Good eye contact, normal affect.  Memory intact not anxious or depressed appearing.  CNS: CN 2-12 intact, power, tone and sensation normal throughout.       Assessment & Plan:

## 2012-11-18 NOTE — Assessment & Plan Note (Signed)
Updated lab today and the importance of supplementation explained

## 2012-11-18 NOTE — Assessment & Plan Note (Signed)
Improved and controlled with gabapentin at bedtime

## 2012-11-18 NOTE — Assessment & Plan Note (Signed)
Patient educated about the importance of limiting  Carbohydrate intake , the need to commit to daily physical activity for a minimum of 30 minutes , and to commit weight loss. The fact that changes in all these areas will reduce or eliminate all together the development of diabetes is stressed.   Updated lab today 

## 2012-11-18 NOTE — Assessment & Plan Note (Signed)
Mild intermittent, albuterol as neede only, on avg approx twice per month or less

## 2012-11-18 NOTE — Assessment & Plan Note (Signed)
Improved with gabapentin. 

## 2012-11-18 NOTE — Patient Instructions (Addendum)
F/U in mid January, call if you need me before  Please get flu vaccine on job, or come in October tio the office  Rectal today is normal  You NEED to have the diagnostic mammogram, we are having it scheduled and you will get the appt at checkout.  Please start aspirin 81mg  daily top reduce stroke risk.  Please start calcium with D 1200mg  /1000IU once daily for bone health  Labs today, CBC, chem 7, cholesterol, vit D and HBA1C and TSH  Please work on reducing cigarette use, you can quit , cutting back gradually is one approach We will provide info ion free smoking xcessation classes through the heal;th dept to help with this  You need as rept pelvic ultrasound also to follow up the cyst on your ovaries

## 2012-11-19 ENCOUNTER — Other Ambulatory Visit: Payer: Self-pay | Admitting: Family Medicine

## 2012-11-19 LAB — LIPID PANEL
Cholesterol: 182 mg/dL (ref 0–200)
LDL Cholesterol: 91 mg/dL (ref 0–99)
Triglycerides: 128 mg/dL (ref <150)
VLDL: 26 mg/dL (ref 0–40)

## 2012-11-19 LAB — BASIC METABOLIC PANEL
BUN: 14 mg/dL (ref 6–23)
Calcium: 9.7 mg/dL (ref 8.4–10.5)
Chloride: 101 mEq/L (ref 96–112)
Creat: 0.65 mg/dL (ref 0.50–1.10)

## 2012-11-19 LAB — POC HEMOCCULT BLD/STL (OFFICE/1-CARD/DIAGNOSTIC): Fecal Occult Blood, POC: NEGATIVE

## 2012-11-19 LAB — HEMOGLOBIN A1C
Hgb A1c MFr Bld: 6 % — ABNORMAL HIGH (ref <5.7)
Mean Plasma Glucose: 126 mg/dL — ABNORMAL HIGH (ref <117)

## 2012-11-19 MED ORDER — AMLODIPINE BESYLATE 10 MG PO TABS: ORAL_TABLET | ORAL | Status: DC

## 2012-11-19 MED ORDER — ERGOCALCIFEROL 1.25 MG (50000 UT) PO CAPS: 50000.0000 [IU] | ORAL_CAPSULE | ORAL | Status: DC

## 2012-11-19 MED ORDER — HYDROCHLOROTHIAZIDE 25 MG PO TABS: 25.0000 mg | ORAL_TABLET | Freq: Every day | ORAL | Status: DC

## 2012-11-19 NOTE — Telephone Encounter (Signed)
Patient is aware 

## 2012-11-20 LAB — CA 125: CA 125: 11.2 U/mL (ref 0.0–30.2)

## 2012-11-21 ENCOUNTER — Ambulatory Visit (HOSPITAL_COMMUNITY): Payer: BC Managed Care – PPO

## 2012-11-27 ENCOUNTER — Encounter (HOSPITAL_COMMUNITY): Payer: BC Managed Care – PPO

## 2012-12-03 ENCOUNTER — Ambulatory Visit (HOSPITAL_COMMUNITY)
Admission: RE | Admit: 2012-12-03 | Discharge: 2012-12-03 | Disposition: A | Discharge status: Home / Self Care | Payer: BC Managed Care – PPO | Source: Ambulatory Visit | Attending: Family Medicine | Admitting: Family Medicine

## 2012-12-03 DIAGNOSIS — N83209 Unspecified ovarian cyst, unspecified side: Secondary | ICD-10-CM

## 2012-12-03 DIAGNOSIS — R928 Other abnormal and inconclusive findings on diagnostic imaging of breast: Secondary | ICD-10-CM | POA: Insufficient documentation

## 2012-12-03 DIAGNOSIS — Z09 Encounter for follow-up examination after completed treatment for conditions other than malignant neoplasm: Secondary | ICD-10-CM | POA: Insufficient documentation

## 2012-12-03 DIAGNOSIS — D259 Leiomyoma of uterus, unspecified: Secondary | ICD-10-CM | POA: Insufficient documentation

## 2012-12-05 ENCOUNTER — Other Ambulatory Visit: Payer: Self-pay | Admitting: Family Medicine

## 2012-12-05 DIAGNOSIS — R935 Abnormal findings on diagnostic imaging of other abdominal regions, including retroperitoneum: Secondary | ICD-10-CM

## 2012-12-08 ENCOUNTER — Telehealth: Payer: Self-pay | Admitting: Family Medicine

## 2012-12-08 NOTE — Telephone Encounter (Addendum)
Spoke [place] and she is going to call back

## 2012-12-09 NOTE — Telephone Encounter (Signed)
Spoke with patient to tell her that [facility] has tried calling her with an appointment and that they have mailed her a letter for an appointment and she spoke with Toni Amend about her [place] report

## 2012-12-12 ENCOUNTER — Other Ambulatory Visit: Payer: Self-pay | Admitting: Family Medicine

## 2012-12-26 ENCOUNTER — Encounter: Payer: Self-pay | Admitting: Obstetrics and Gynecology

## 2013-04-09 ENCOUNTER — Ambulatory Visit: Payer: BC Managed Care – PPO | Admitting: Family Medicine

## 2013-06-18 ENCOUNTER — Ambulatory Visit: Payer: BC Managed Care – PPO | Admitting: Family Medicine

## 2013-07-28 ENCOUNTER — Ambulatory Visit: Payer: BC Managed Care – PPO | Admitting: Family Medicine

## 2013-08-07 ENCOUNTER — Other Ambulatory Visit: Payer: Self-pay

## 2013-08-07 MED ORDER — HYDROCHLOROTHIAZIDE 25 MG PO TABS: 25.0000 mg | ORAL_TABLET | Freq: Every day | ORAL | Status: DC

## 2013-08-07 MED ORDER — AMLODIPINE BESYLATE 10 MG PO TABS: ORAL_TABLET | ORAL | Status: DC

## 2013-08-11 ENCOUNTER — Ambulatory Visit: Payer: BC Managed Care – PPO | Admitting: Family Medicine

## 2013-09-23 ENCOUNTER — Ambulatory Visit: Payer: BC Managed Care – PPO | Admitting: Family Medicine

## 2013-10-12 ENCOUNTER — Other Ambulatory Visit: Payer: Self-pay | Admitting: Family Medicine

## 2013-11-02 ENCOUNTER — Encounter: Payer: Self-pay | Admitting: Family Medicine

## 2013-11-02 ENCOUNTER — Encounter (INDEPENDENT_AMBULATORY_CARE_PROVIDER_SITE_OTHER): Payer: Self-pay

## 2013-11-02 ENCOUNTER — Telehealth: Payer: Self-pay | Admitting: Family Medicine

## 2013-11-02 ENCOUNTER — Ambulatory Visit (INDEPENDENT_AMBULATORY_CARE_PROVIDER_SITE_OTHER): Payer: BC Managed Care – PPO | Admitting: Family Medicine

## 2013-11-02 VITALS — BP 138/82 | HR 88 | Resp 18 | Ht 62.0 in | Wt 168.0 lb

## 2013-11-02 DIAGNOSIS — J45909 Unspecified asthma, uncomplicated: Secondary | ICD-10-CM

## 2013-11-02 DIAGNOSIS — R7301 Impaired fasting glucose: Secondary | ICD-10-CM

## 2013-11-02 DIAGNOSIS — F17200 Nicotine dependence, unspecified, uncomplicated: Secondary | ICD-10-CM

## 2013-11-02 DIAGNOSIS — R9389 Abnormal findings on diagnostic imaging of other specified body structures: Secondary | ICD-10-CM

## 2013-11-02 DIAGNOSIS — M25562 Pain in left knee: Secondary | ICD-10-CM | POA: Insufficient documentation

## 2013-11-02 DIAGNOSIS — R209 Unspecified disturbances of skin sensation: Secondary | ICD-10-CM

## 2013-11-02 DIAGNOSIS — R935 Abnormal findings on diagnostic imaging of other abdominal regions, including retroperitoneum: Secondary | ICD-10-CM | POA: Insufficient documentation

## 2013-11-02 DIAGNOSIS — M25569 Pain in unspecified knee: Secondary | ICD-10-CM

## 2013-11-02 DIAGNOSIS — Z23 Encounter for immunization: Secondary | ICD-10-CM | POA: Insufficient documentation

## 2013-11-02 DIAGNOSIS — E669 Obesity, unspecified: Secondary | ICD-10-CM

## 2013-11-02 DIAGNOSIS — R200 Anesthesia of skin: Secondary | ICD-10-CM

## 2013-11-02 DIAGNOSIS — E66811 Obesity, class 1: Secondary | ICD-10-CM | POA: Insufficient documentation

## 2013-11-02 DIAGNOSIS — I10 Essential (primary) hypertension: Secondary | ICD-10-CM

## 2013-11-02 DIAGNOSIS — J4520 Mild intermittent asthma, uncomplicated: Secondary | ICD-10-CM

## 2013-11-02 NOTE — Assessment & Plan Note (Signed)
Administered at [place]

## 2013-11-02 NOTE — Assessment & Plan Note (Signed)
1 month h/o pain and probable instability, xray of knee

## 2013-11-02 NOTE — Telephone Encounter (Signed)
Pls call pt, let here know when i reviewed her record, she should have seen gyne last year for abnormal pelvic US, possible abnormality of endometrium. She was referred to Dr Glo Herring, but seems to  have cancelled appt from record review If she did not go, needs to have gyne eval. I do not plan to rept [place] at hospital, but refer and let gyne of her choice do the [place], p,ls verfy that she  will go, and if possible set up the appt, OK to fax [place] report from 2014, I have already entered a referral. Pls provide me with feedback, this is very important, thanks

## 2013-11-02 NOTE — Assessment & Plan Note (Signed)
Controlled, has not used rescue inhaler inover 6 month, states smoke is a main trigger

## 2013-11-02 NOTE — Assessment & Plan Note (Signed)
Unchanged. Patient re-educated about  the importance of commitment to a  minimum of 150 minutes of exercise per week. The importance of healthy food choices with portion control discussed. Encouraged to start a food diary, count calories and to consider  joining a support group. Sample diet sheets offered. Goals set by the patient for the next several months.    

## 2013-11-02 NOTE — Assessment & Plan Note (Signed)
Patient educated about the importance of limiting  Carbohydrate intake , the need to commit to daily physical activity for a minimum of 30 minutes , and to commit weight loss. The fact that changes in all these areas will reduce or eliminate all together the development of diabetes is stressed.   Updated lab needed at/ before next visit.  

## 2013-11-02 NOTE — Assessment & Plan Note (Signed)
Controlled, no change in medication DASH diet and commitment to daily physical activity for a minimum of 30 minutes discussed and encouraged, as a part of hypertension management. The importance of attaining a healthy weight is also discussed.  

## 2013-11-02 NOTE — Assessment & Plan Note (Signed)
Noted in 2014, pt was referred to gyne but did not keep appt, needs to see gyne for further evaluation, will refer to gyne of her choice, gyne to perform [place]

## 2013-11-02 NOTE — Patient Instructions (Addendum)
Annual physical exam in 3 month, call if you need me before  Prevnar today  X ray of left knee today  Fasting cBC, lipid, cmp, HBA1C and TS, uric acid level as soon as possible, past due   Please work on QUITTING cigarettes and eating non processed foods from the garden for improved health   Sched and do mammogram due in September  Call if you want testing about numb hands which is likely due to carpal tunnel synd

## 2013-11-02 NOTE — Progress Notes (Signed)
Subjective:    Patient ID: Paula Best, female    DOB: March 13, 1956, 58 y.o.   MRN: [id number]  HPI The PT is here for follow up and re-evaluation of chronic medical conditions, medication management and review of any available recent lab and radiology data.  Preventive health is updated, specifically  Cancer screening and Immunization.   Needs gyne eval since abnormal [place] in 2014 , never kept appt, apparently canceled this. The PT denies any adverse reactions to current medications since the last visit.  1 month h/o increased left knee pain with occasional instability Ongoing numbness, mainly in hands , intermittent, flicks them for relief. No noted weakness       Review of Systems   See HPI Denies recent fever or chills. Denies sinus pressure, nasal congestion, ear pain or sore throat. Denies chest congestion, productive cough or wheezing. Denies chest pains, palpitations and leg swelling Denies abdominal pain, nausea, vomiting,diarrhea or constipation.   Denies dysuria, frequency, hesitancy or incontinence.  Denies headaches, seizures,  Denies depression, anxiety or insomnia. Denies skin break down or rash.        Objective:   Physical Exam BP 138/82  Pulse 88  Resp 18  Ht 5\' 2"  (1.575 m)  Wt 168 lb (76.204 kg)  BMI 30.72 kg/m2  SpO2 98%  LMP 07/25/2011 Patient alert and oriented and in no cardiopulmonary distress.  HEENT: No facial asymmetry, EOMI,   oropharynx pink and moist.  Neck supple no JVD, no mass.  Chest: Clear to auscultation bilaterally.decfreased though adequate air entry, no crackles or wheezes  CVS: S1, S2 no murmurs, no S3.Regular rate.  ABD: Soft non tender.   Ext: No edema  MS: Adequate ROM spine, shoulders, hips and knees.no palpable warmth tenderness swelling or crepitus of left knee which has fullROM  Skin: Intact, no ulcerations or rash noted.  Psych: Good eye contact, normal affect. Memory intact not anxious or depressed  appearing.  CNS: CN 2-12 intact, power,  normal throughout.no focal deficits noted.        Assessment & Plan:  HYPERTENSION Controlled, no change in medication DASH diet and commitment to daily physical activity for a minimum of 30 minutes discussed and encouraged, as a part of hypertension management. The importance of attaining a healthy weight is also discussed.   Asthma, mild intermittent Controlled, has not used rescue inhaler inover 6 month, states smoke is a main trigger  Numbness Bilateral hand numbness, intermitently, no weakness, positive flick test, no interest in investigation currently,carpal tunnel syndrome is likely culprit. No weakness in grip, no urgent need for testing currently   IFG (impaired fasting glucose) Patient educated about the importance of limiting  Carbohydrate intake , the need to commit to daily physical activity for a minimum of 30 minutes , and to commit weight loss. The fact that changes in all these areas will reduce or eliminate all together the development of diabetes is stressed.   Updated lab needed at/ before next visit.   TOBACCO USER Ongoing daily nicotine use, no interest in quitting currently Patient counseled for approximately 5 minutes regarding the health risks of ongoing nicotine use, specifically all types of cancer, heart disease, stroke and respiratory failure. The options available for help with cessation ,the behavioral changes to assist the process, and the option to either gradully reduce usage  Or abruptly stop.is also discussed. Pt is also encouraged to set specific goals in number of cigarettes used daily, as well as to set a  quit date.   Obesity (BMI 30.0-34.9) Unchanged Patient re-educated about  the importance of commitment to a  minimum of 150 minutes of exercise per week. The importance of healthy food choices with portion control discussed. Encouraged to start a food diary, count calories and to consider  joining  a support group. Sample diet sheets offered. Goals set by the patient for the next several months.     Knee pain, left 1 month h/o pain and probable instability, xray of knee  Abnormal endometrial ultrasound Noted in 2014, pt was referred to gyne but did not keep appt, needs to see gyne for further evaluation, will refer to gyne of her choice, gyne to perform [place]  Need for vaccination with 13-polyvalent pneumococcal conjugate vaccine Administered at [place]

## 2013-11-02 NOTE — Assessment & Plan Note (Signed)
Ongoing daily nicotine use, no interest in quitting currently Patient counseled for approximately 5 minutes regarding the health risks of ongoing nicotine use, specifically all types of cancer, heart disease, stroke and respiratory failure. The options available for help with cessation ,the behavioral changes to assist the process, and the option to either gradully reduce usage  Or abruptly stop.is also discussed. Pt is also encouraged to set specific goals in number of cigarettes used daily, as well as to set a quit date.

## 2013-11-02 NOTE — Assessment & Plan Note (Signed)
Bilateral hand numbness, intermitently, no weakness, positive flick test, no interest in investigation currently,carpal tunnel syndrome is likely culprit. No weakness in grip, no urgent need for testing currently

## 2013-11-03 NOTE — Telephone Encounter (Signed)
Called and left message for patient to return call.  

## 2013-11-10 ENCOUNTER — Other Ambulatory Visit: Payer: Self-pay

## 2013-11-10 ENCOUNTER — Telehealth: Payer: Self-pay | Admitting: Family Medicine

## 2013-11-10 MED ORDER — HYDROCHLOROTHIAZIDE 25 MG PO TABS: ORAL_TABLET | ORAL | Status: DC

## 2013-11-10 MED ORDER — AMLODIPINE BESYLATE 10 MG PO TABS: ORAL_TABLET | ORAL | Status: DC

## 2013-11-10 NOTE — Telephone Encounter (Signed)
Patient aware and states that she will make and keep appt with Dr. Glo Herring.

## 2013-11-10 NOTE — Telephone Encounter (Signed)
noted 

## 2014-02-17 ENCOUNTER — Other Ambulatory Visit: Payer: Self-pay | Admitting: Family Medicine

## 2014-02-24 ENCOUNTER — Other Ambulatory Visit: Payer: Self-pay | Admitting: Family Medicine

## 2014-02-24 MED ORDER — HYDROCHLOROTHIAZIDE 25 MG PO TABS
ORAL_TABLET | ORAL | Status: DC
Start: 2014-02-24 — End: 2014-05-19

## 2014-02-24 NOTE — Telephone Encounter (Signed)
Med sent.

## 2014-03-02 ENCOUNTER — Encounter: Payer: BC Managed Care – PPO | Admitting: Family Medicine

## 2014-05-19 ENCOUNTER — Ambulatory Visit (INDEPENDENT_AMBULATORY_CARE_PROVIDER_SITE_OTHER): Payer: BLUE CROSS/BLUE SHIELD | Admitting: Family Medicine

## 2014-05-19 ENCOUNTER — Encounter: Payer: Self-pay | Admitting: Family Medicine

## 2014-05-19 VITALS — BP 142/82 | HR 62 | Resp 16 | Ht 62.0 in | Wt 179.0 lb

## 2014-05-19 DIAGNOSIS — R7301 Impaired fasting glucose: Secondary | ICD-10-CM

## 2014-05-19 DIAGNOSIS — Z720 Tobacco use: Secondary | ICD-10-CM

## 2014-05-19 DIAGNOSIS — N83209 Unspecified ovarian cyst, unspecified side: Secondary | ICD-10-CM

## 2014-05-19 DIAGNOSIS — E669 Obesity, unspecified: Secondary | ICD-10-CM

## 2014-05-19 DIAGNOSIS — N8320 Unspecified ovarian cysts: Secondary | ICD-10-CM

## 2014-05-19 DIAGNOSIS — M25562 Pain in left knee: Secondary | ICD-10-CM

## 2014-05-19 DIAGNOSIS — F17200 Nicotine dependence, unspecified, uncomplicated: Secondary | ICD-10-CM

## 2014-05-19 DIAGNOSIS — I10 Essential (primary) hypertension: Secondary | ICD-10-CM

## 2014-05-19 DIAGNOSIS — Z1231 Encounter for screening mammogram for malignant neoplasm of breast: Secondary | ICD-10-CM

## 2014-05-19 DIAGNOSIS — Z1159 Encounter for screening for other viral diseases: Secondary | ICD-10-CM

## 2014-05-19 DIAGNOSIS — R4689 Other symptoms and signs involving appearance and behavior: Secondary | ICD-10-CM

## 2014-05-19 MED ORDER — HYDROCHLOROTHIAZIDE 25 MG PO TABS: ORAL_TABLET | ORAL | Status: DC

## 2014-05-19 NOTE — Patient Instructions (Addendum)
F/u in 4 month.Pls call if you need me before  Labs today  [place] an APPOINTMENT for Dr Glo Herring, it is VITAL you keep the appt, this is 2 years past due.  Pls start cutting back on cigarettes so that you improve health  Left knee symptoms are form osteoarthritis, keep weight down, keep active, use tylenol as entered for relief Will hold on xray today per your request, which I  think is appropriate  Start aspirin 81 mg daily to reduce stroke risk and also ensure calcium intake 600 to 1200mg  daily for bone health

## 2014-05-19 NOTE — Progress Notes (Signed)
Subjective:    Patient ID: Paula Best, female    DOB: 15-Oct-1955, 59 y.o.   MRN: [id number]  HPI  The PT is here for follow up and re-evaluation of chronic medical conditions, medication management and review of any available recent lab and radiology data.  Preventive health is updated, specifically  Cancer screening and Immunization.   6 month h/o intermittent left knee "tightness" and stiff esp when she first  Starts walking also feels as though there is knot in the knee, was seen at urgent care recently about this , no xray done and she has no interest in an xray now, unable to identify any lump/knot in the knee at visit. The PT denies any adverse reactions to current medications since the last visit. Has had npo blood pressure medication on day of visit and hads been out of one for over 2 weeks, re educated re danger of this behavior. Has had an abnormal pelvic US since 2014, was referred to gyn still has not been evaluated, an appt is made while she is in the office and given to her Labs are past due and request that she have then today(not fulfilled)     Review of Systems See HPI Denies recent fever or chills. Denies sinus pressure, nasal congestion, ear pain or sore throat. Denies chest congestion, productive cough or wheezing. Denies chest pains, palpitations and leg swelling Denies abdominal pain, nausea, vomiting,diarrhea or constipation.   Denies dysuria, frequency, hesitancy or incontinence.  Denies headaches, seizures, numbness, or tingling. Denies depression, anxiety or insomnia. Denies skin break down or rash.        Objective:   Physical Exam BP 142/82 mmHg  Pulse 62  Resp 16  Ht 5\' 2"  (1.575 m)  Wt 179 lb (81.194 kg)  BMI 32.73 kg/m2  SpO2 98%  LMP 07/25/2011 Patient alert and oriented and in no cardiopulmonary distress.  HEENT: No facial asymmetry, EOMI,   oropharynx pink and moist.  Neck supple no JVD, no mass.  Chest: Clear to auscultation  bilaterally.Decreased though adequate air entry  CVS: S1, S2 no murmurs, no S3.Regular rate.  ABD: Soft non tender.   Ext: No edema  MS: Adequate ROM spine, shoulders, hips and knees.  Skin: Intact, no ulcerations or rash noted.  Psych: Good eye contact, normal affect. Memory intact not anxious or depressed appearing.  CNS: CN 2-12 intact, power,  normal throughout.no focal deficits noted.        Assessment & Plan:  TOBACCO USER Unchanged Patient counseled for approximately 5 minutes regarding the health risks of ongoing nicotine use, specifically all types of cancer, heart disease, stroke and respiratory failure. The options available for help with cessation ,the behavioral changes to assist the process, and the option to either gradully reduce usage  Or abruptly stop.is also discussed. Pt is also encouraged to set specific goals in number of cigarettes used daily, as well as to set a quit date.    Obesity (BMI 30.0-34.9) Unchanged. Patient re-educated about  the importance of commitment to a  minimum of 150 minutes of exercise per week. The importance of healthy food choices with portion control discussed. Encouraged to start a food diary, count calories and to consider  joining a support group. Sample diet sheets offered. Goals set by the patient for the next several months.      Knee pain, left Persitent, consistent with osteoarthritis, however pain is not disabling and she has no joint instability, weight loss and tylenol  Elects to forgo xray of joint currently   Ovarian cyst, needs rept [place] in8/2014 Gyne appt given at [facility] as pt has still not been eval;uated despite being referred in the past , the importance of keeping this appt is again stressed   Essential hypertension Uncontrolled, non compliant with meds. Re educated re the importance of this.das DASH diet and commitment to daily physical activity for a minimum of 30 minutes discussed and encouraged, as a  part of hypertension management. The importance of attaining a healthy weight is also discussed.    Non-compliant behavior Approx 7 mins spent edcuating pt re the importance of medical compliance is concerned esp as it related to med compliance , follow up of abnormal studies , in her case specificall her pelvic US, and also need for  Regular lab work and cancer screening, seems better motivated to follow through

## 2014-05-21 DIAGNOSIS — R4689 Other symptoms and signs involving appearance and behavior: Secondary | ICD-10-CM | POA: Insufficient documentation

## 2014-05-21 NOTE — Assessment & Plan Note (Signed)
Unchanged. Patient re-educated about  the importance of commitment to a  minimum of 150 minutes of exercise per week. The importance of healthy food choices with portion control discussed. Encouraged to start a food diary, count calories and to consider  joining a support group. Sample diet sheets offered. Goals set by the patient for the next several months.    

## 2014-05-21 NOTE — Assessment & Plan Note (Signed)
Approx 7 mins spent edcuating pt re the importance of medical compliance is concerned esp as it related to med compliance , follow up of abnormal studies , in her case specificall her pelvic US, and also need for  Regular lab work and cancer screening, seems better motivated to follow through

## 2014-05-21 NOTE — Assessment & Plan Note (Signed)
Uncontrolled, non compliant with meds. Re educated re the importance of this.das DASH diet and commitment to daily physical activity for a minimum of 30 minutes discussed and encouraged, as a part of hypertension management. The importance of attaining a healthy weight is also discussed.

## 2014-05-21 NOTE — Assessment & Plan Note (Signed)
Gyne appt given at [facility] as pt has still not been eval;uated despite being referred in the past , the importance of keeping this appt is again stressed

## 2014-05-21 NOTE — Assessment & Plan Note (Signed)
Patient educated about the importance of limiting  Carbohydrate intake , the need to commit to daily physical activity for a minimum of 30 minutes , and to commit weight loss. The fact that changes in all these areas will reduce or eliminate all together the development of diabetes is stressed.   Updated lab needed at/ before next visit.  

## 2014-05-21 NOTE — Assessment & Plan Note (Signed)
Unchanged. Patient counseled for approximately 5 minutes regarding the health risks of ongoing nicotine use, specifically all types of cancer, heart disease, stroke and respiratory failure. The options available for help with cessation ,the behavioral changes to assist the process, and the option to either gradully reduce usage  Or abruptly stop.is also discussed. Pt is also encouraged to set specific goals in number of cigarettes used daily, as well as to set a quit date.  

## 2014-05-21 NOTE — Assessment & Plan Note (Signed)
Persitent, consistent with osteoarthritis, however pain is not disabling and she has no joint instability, weight loss and tylenol Elects to forgo xray of joint currently

## 2014-05-31 ENCOUNTER — Ambulatory Visit (HOSPITAL_COMMUNITY)
Admission: RE | Admit: 2014-05-31 | Discharge: 2014-05-31 | Disposition: A | Discharge status: Home / Self Care | Payer: BLUE CROSS/BLUE SHIELD | Source: Ambulatory Visit | Attending: Family Medicine | Admitting: Family Medicine

## 2014-05-31 ENCOUNTER — Ambulatory Visit: Payer: Self-pay | Admitting: Obstetrics and Gynecology

## 2014-05-31 DIAGNOSIS — Z1231 Encounter for screening mammogram for malignant neoplasm of breast: Secondary | ICD-10-CM | POA: Insufficient documentation

## 2014-06-01 ENCOUNTER — Ambulatory Visit (INDEPENDENT_AMBULATORY_CARE_PROVIDER_SITE_OTHER): Payer: BLUE CROSS/BLUE SHIELD | Admitting: Obstetrics and Gynecology

## 2014-06-01 ENCOUNTER — Other Ambulatory Visit (HOSPITAL_COMMUNITY)
Admission: RE | Admit: 2014-06-01 | Discharge: 2014-06-01 | Disposition: A | Discharge status: Home / Self Care | Payer: BLUE CROSS/BLUE SHIELD | Source: Ambulatory Visit | Attending: Obstetrics and Gynecology | Admitting: Obstetrics and Gynecology

## 2014-06-01 ENCOUNTER — Encounter: Payer: Self-pay | Admitting: Obstetrics and Gynecology

## 2014-06-01 VITALS — BP 130/90 | HR 84 | Ht 62.0 in | Wt 173.5 lb

## 2014-06-01 DIAGNOSIS — Z01419 Encounter for gynecological examination (general) (routine) without abnormal findings: Secondary | ICD-10-CM | POA: Diagnosis not present

## 2014-06-01 DIAGNOSIS — N950 Postmenopausal bleeding: Secondary | ICD-10-CM

## 2014-06-01 DIAGNOSIS — Z1151 Encounter for screening for human papillomavirus (HPV): Secondary | ICD-10-CM | POA: Diagnosis present

## 2014-06-01 DIAGNOSIS — Z139 Encounter for screening, unspecified: Secondary | ICD-10-CM

## 2014-06-01 NOTE — Progress Notes (Signed)
Patient ID: Paula Best, female   DOB: 03/30/1955, 59 y.o.   MRN: [id number] Pt here today for follow up on an abnormal [place] that was done by Dr.Simpson's office. Pt states that she has some bleeding last month but none since then.   [facility] Visit  Patient name: Paula Best MRN [id number]  Date of birth: 29-Mar-1955  CC & HPI:  Paula Best is a 59 y.o. female presenting today for bleeding postmenopausal. Had u/s in 11/2012, and showed a  Of a polyp in the endometrium.  ROS:  irreg menses  Pertinent History Reviewed:   Reviewed:  Medical         Past Medical History  Diagnosis Date  . Tobacco user   . Lipoma   . Obesity   . Hypertension   . Anemia, iron deficiency   . Asthma   . Vitamin D deficiency   . Heart murmur, systolic   . LVH (left ventricular hypertrophy)     mild- Echo 3/11  . Menorrhagia                               Surgical Hx:    Past Surgical History  Procedure Laterality Date  . Appendectomy    . Dental surgery    . Tubal ligation  26  . Lipoma excision  May 29, Dr Arnoldo Morale   Medications: Reviewed & Updated - see associated section                       Current outpatient prescriptions:  .  acetaminophen (TYLENOL) 500 MG tablet, Omne tablet twice daily as needed, for left knee pain, Disp: 30 tablet, Rfl: 0 .  amLODipine (NORVASC) 10 MG tablet, TAKE 1 TABLET BY MOUTH EVERY DAY, Disp: 30 tablet, Rfl: 3 .  aspirin 81 MG tablet, Take 1 tablet (81 mg total) by mouth daily., Disp: 30 tablet, Rfl: 3 .  hydrochlorothiazide (HYDRODIURIL) 25 MG tablet, TAKE 1 TABLET BY MOUTH EVERY DAY, Disp: 30 tablet, Rfl: 2   Social History: Reviewed -  reports that she has been smoking Cigarettes.  She has been smoking about 1.00 pack per day. She does not have any smokeless tobacco history on file.  Objective Findings:  Vitals: Blood pressure 130/90, pulse 84, height 5\' 2"  (1.575 m), weight 173 lb 8 oz (78.699 kg), last menstrual period  07/25/2011.  Physical Examination: General appearance - alert, well appearing, and in no distress, oriented to person, place, and time, overweight and anxious Mental status - alert, oriented to person, place, and time, normal mood, behavior, speech, dress, motor activity, and thought processes, drowsy Abdomen - soft, nontender, nondistended, no masses or organomegaly Pelvic - normal external genitalia, vulva, vagina, cervix, uterus and adnexa   Assessment & Plan:   A:  1. postmeno bleeding, ? Of endomet polyp  P:  1. sonohysterogram in office, with endometrial biopsy 2. Pap done.

## 2014-06-01 NOTE — Patient Instructions (Signed)
Sonohysterogram A sonohysterogram is a procedure to examine the inside of your uterus. This exam uses sound waves sent to a computer to make real-time pictures of the inside of your uterus. To get the best images, a germ-free, saltwater solution (sterile saline) is injected into your uterus through your vagina. A sonohysterogram can show whether there is scarring or abnormal growths inside your uterus. It can also show whether your uterus is an abnormal shape or whether the lining is too thin.  LET [facility] CARE PROVIDER KNOW ABOUT:  Any allergies you have.  All medicines you are taking, including vitamins, herbs, eyedrops, creams, and over-the-counter medicines.  Previous problems you or members of your family have had with the use of anesthetics.  Any blood disorders you have.  Previous surgeries you have had.  Medical conditions you have.  The dates of your last period.  Possibility of pregnancy. RISKS AND COMPLICATIONS Generally, a sonohysterogram is a safe procedure. However, as with any procedure, problems can occur. Possible problems include:  Bleeding.  Infection. BEFORE THE PROCEDURE  Your health care provider may have you take an over-the-counter pain medicine.  You may get a prescription for antibiotic medicine.  Your health care provider may give you a pregnancy test before the procedure.  You will empty your bladder. PROCEDURE   You will lie down on the examining table with your knees raised or your feet in stirrups.  Your health care provider may do a pelvic exam before starting the procedure.  A slender, handheld device (transducer) will be lubricated and placed into your vagina.  The transducer will be positioned to send sound waves to your uterus.  The sound waves will bounce back to the transducer. They will be sent to a computer.  The computer will turn the sound waves into live images.  Your health care provider will view the images on a screen  during the procedure.  Your health care provider will remove the transducer from your vagina and use an instrument to widen the opening (speculum).  A swab will be used to clean the opening to your uterus (cervix).  A long, thin tube (catheter) will then be placed through your cervix into your uterus.  Your health care provider will fill your uterus with sterile saline through the catheter. You may feel some cramping.  The speculum will be removed.  The transducer will be placed back in your vagina to take more images. AFTER THE PROCEDURE After the procedure, it is typical to have light bleeding from your vagina, cramping, and watery vaginal discharge.  Document Released: 07/27/2013 Document Reviewed: 02/23/2013 [facility] Patient Information 2015 [place]. This information is not intended to replace advice given to you by your health care provider. Make sure you discuss any questions you have with your health care provider.

## 2014-06-04 LAB — CYTOLOGY - PAP

## 2014-06-07 ENCOUNTER — Other Ambulatory Visit: Payer: Self-pay | Admitting: Obstetrics and Gynecology

## 2014-06-07 DIAGNOSIS — N9489 Other specified conditions associated with female genital organs and menstrual cycle: Secondary | ICD-10-CM

## 2014-06-08 ENCOUNTER — Other Ambulatory Visit: Payer: BLUE CROSS/BLUE SHIELD

## 2014-06-09 ENCOUNTER — Other Ambulatory Visit: Payer: BLUE CROSS/BLUE SHIELD

## 2014-06-09 ENCOUNTER — Ambulatory Visit: Payer: BLUE CROSS/BLUE SHIELD | Admitting: Obstetrics and Gynecology

## 2014-06-14 ENCOUNTER — Other Ambulatory Visit: Payer: Self-pay | Admitting: Obstetrics and Gynecology

## 2014-06-14 ENCOUNTER — Ambulatory Visit (INDEPENDENT_AMBULATORY_CARE_PROVIDER_SITE_OTHER): Payer: BLUE CROSS/BLUE SHIELD

## 2014-06-14 ENCOUNTER — Ambulatory Visit (INDEPENDENT_AMBULATORY_CARE_PROVIDER_SITE_OTHER): Payer: BLUE CROSS/BLUE SHIELD | Admitting: Obstetrics and Gynecology

## 2014-06-14 DIAGNOSIS — N9489 Other specified conditions associated with female genital organs and menstrual cycle: Secondary | ICD-10-CM

## 2014-06-14 DIAGNOSIS — N950 Postmenopausal bleeding: Secondary | ICD-10-CM | POA: Diagnosis not present

## 2014-06-14 DIAGNOSIS — R935 Abnormal findings on diagnostic imaging of other abdominal regions, including retroperitoneum: Secondary | ICD-10-CM

## 2014-06-14 NOTE — Progress Notes (Signed)
Patient ID: MAYIA MEGILL, female   DOB: Nov 07, 1955, 59 y.o.   MRN: [id number]    [facility] Visit  Patient name: Paula Best MRN [id number]  Date of birth: 18-Oct-1955  CC & HPI:  Paula Best is a 59 y.o. female, who is post-menopausal, presenting today for a follow-up of an abnormal [place] at Dr. Griffin Dakin last month that showed a possible polyp, The U/s was from 2014. Marland Kitchen Her LNMP was last month, she has been having menses every few months, but pt notes irregularity between cycles. Pt's [place] today shows  3polyps and uterine thickening.    ROS:  A complete 10 system review of systems was obtained and all systems are negative except as noted in the HPI and PMH.   Pertinent History Reviewed:   Reviewed: Significant for tubal ligation Medical         Past Medical History  Diagnosis Date  . Tobacco user   . Lipoma   . Obesity   . Hypertension   . Anemia, iron deficiency   . Asthma   . Vitamin D deficiency   . Heart murmur, systolic   . LVH (left ventricular hypertrophy)     mild- Echo 3/11  . Menorrhagia                               Surgical Hx:    Past Surgical History  Procedure Laterality Date  . Appendectomy    . Dental surgery    . Tubal ligation  26  . Lipoma excision  May 29, Dr Arnoldo Morale   Medications: Reviewed & Updated - see associated section                       Current outpatient prescriptions:  .  acetaminophen (TYLENOL) 500 MG tablet, Omne tablet twice daily as needed, for left knee pain, Disp: 30 tablet, Rfl: 0 .  amLODipine (NORVASC) 10 MG tablet, TAKE 1 TABLET BY MOUTH EVERY DAY, Disp: 30 tablet, Rfl: 3 .  aspirin 81 MG tablet, Take 1 tablet (81 mg total) by mouth daily., Disp: 30 tablet, Rfl: 3 .  hydrochlorothiazide (HYDRODIURIL) 25 MG tablet, TAKE 1 TABLET BY MOUTH EVERY DAY, Disp: 30 tablet, Rfl: 2   Social History: Reviewed -  reports that she has been smoking Cigarettes.  She has been smoking about 1.00 pack per day. She does not  have any smokeless tobacco history on file.  Objective Findings:  Vitals: Last menstrual period 07/25/2011.  Physical Examination: General appearance - alert, well appearing, and in no distress and oriented to person, place, and time Mental status - alert, oriented to person, place, and time Pelvic - normal external genitalia, vulva, vagina, cervix, uterus and adnexa VULVA: normal appearing vulva with no masses, tenderness or lesions VAGINA: normal appearing vagina with normal color and discharge, no lesions CERVIX: normal appearing cervix without discharge or lesions UTERUS: uterus is normal size, shape, consistency and non-tender ADNEXA: normal adnexa in size, nontender and no masses  Assessment & Plan:   A:  1. Endometrial polyp(3) 2. Sonohysterogram done 3. Discussed removal of polyps, will proceed to hysteroscopy, dilation and curettage, removal of polyps  P:  1. Review record , schedule procedure     This chart was scribed for Jonnie Kind, MD by Tula Nakayama, ED Scribe. This patient was seen in room [place] and the patient's care was started  at 3:58 PM.   I personally performed the services described in this documentation, which was SCRIBED in my presence. The recorded information has been reviewed and considered accurate. It has been edited as necessary during review. Jonnie Kind, MD

## 2014-07-02 NOTE — Patient Instructions (Signed)
Paula Best  07/02/2014   Your procedure is scheduled on:  07/06/2014  Report to [facility] at  29  AM.  Call this number if you have problems the morning of surgery: [phone]   Remember:   Do not eat food or drink liquids after midnight.   Take these medicines the morning of surgery with A SIP OF WATER:  Amlodipine, hydrochlorathiazide   Do not wear jewelry, make-up or nail polish.  Do not wear lotions, powders, or perfumes.   Do not shave 48 hours prior to surgery. Men may shave face and neck.  Do not bring valuables to the hospital.  [facility] is not responsible for any belongings or valuables.               Contacts, dentures or bridgework may not be worn into surgery.  Leave suitcase in the car. After surgery it may be brought to your room.  For patients admitted to the hospital, discharge time is determined by your treatment team.               Patients discharged the day of surgery will not be allowed to drive home.  Name and phone number of your driver: family  Special Instructions: Shower using CHG 2 nights before surgery and the night before surgery.  If you shower the day of surgery use CHG.  Use special wash - you have one bottle of CHG for all showers.  You should use approximately 1/3 of the bottle for each shower.   Please read over the following fact sheets that you were given: Pain Booklet, Coughing and Deep Breathing, Surgical Site Infection Prevention, Anesthesia Post-op Instructions and Care and Recovery After Surgery Hysteroscopy Hysteroscopy is a procedure used for looking inside the womb (uterus). It may be done for various reasons, including:  To evaluate abnormal bleeding, fibroid (benign, noncancerous) tumors, polyps, scar tissue (adhesions), and possibly cancer of the uterus.  To look for lumps (tumors) and other uterine growths.  To look for causes of why a woman cannot get pregnant (infertility), causes of recurrent loss of  pregnancy (miscarriages), or a lost intrauterine device (IUD).  To perform a sterilization by blocking the fallopian tubes from inside the uterus. In this procedure, a thin, flexible tube with a tiny light and camera on the end of it (hysteroscope) is used to look inside the uterus. A hysteroscopy should be done right after a menstrual period to be sure you are not pregnant. LET [facility] CARE PROVIDER KNOW ABOUT:   Any allergies you have.  All medicines you are taking, including vitamins, herbs, eye drops, creams, and over-the-counter medicines.  Previous problems you or members of your family have had with the use of anesthetics.  Any blood disorders you have.  Previous surgeries you have had.  Medical conditions you have. RISKS AND COMPLICATIONS  Generally, this is a safe procedure. However, as with any procedure, complications can occur. Possible complications include:  Putting a hole in the uterus.  Excessive bleeding.  Infection.  Damage to the cervix.  Injury to other organs.  Allergic reaction to medicines.  Too much fluid used in the uterus for the procedure. BEFORE THE PROCEDURE   Ask your health care provider about changing or stopping any regular medicines.  Do not take aspirin or blood thinners for 1 week before the procedure, or as directed by your health care provider. These can cause bleeding.  If you  smoke, do not smoke for 2 weeks before the procedure.  In some cases, a medicine is placed in the cervix the day before the procedure. This medicine makes the cervix have a larger opening (dilate). This makes it easier for the instrument to be inserted into the uterus during the procedure.  Do not eat or drink anything for at least 8 hours before the surgery.  Arrange for someone to take you home after the procedure. PROCEDURE   You may be given a medicine to relax you (sedative). You may also be given one of the following:  A medicine that numbs the  area around the cervix (local anesthetic).  A medicine that makes you sleep through the procedure (general anesthetic).  The hysteroscope is inserted through the vagina into the uterus. The camera on the hysteroscope sends a picture to a TV screen. This gives the surgeon a good view inside the uterus.  During the procedure, air or a liquid is put into the uterus, which allows the surgeon to see better.  Sometimes, tissue is gently scraped from inside the uterus. These tissue samples are sent to a lab for testing. AFTER THE PROCEDURE   If you had a general anesthetic, you may be groggy for a couple hours after the procedure.  If you had a local anesthetic, you will be able to go home as soon as you are stable and feel ready.  You may have some cramping. This normally lasts for a couple days.  You may have bleeding, which varies from light spotting for a few days to menstrual-like bleeding for 3-7 days. This is normal.  If your test results are not back during the visit, make an appointment with your health care provider to find out the results. Document Released: 06/18/2000 Document Revised: 12/31/2012 Document Reviewed: 10/09/2012 [facility] Patient Information 2015 [place], [place]. This information is not intended to replace advice given to you by your health care provider. Make sure you discuss any questions you have with your health care provider. Dilation and Curettage or Vacuum Curettage Dilation and curettage (D&C) and vacuum curettage are minor procedures. A D&C involves stretching (dilation) the cervix and scraping (curettage) the inside lining of the womb (uterus). During a D&C, tissue is gently scraped from the inside lining of the uterus. During a vacuum curettage, the lining and tissue in the uterus are removed with the use of gentle suction.  Curettage may be performed to either diagnose or treat a problem. As a diagnostic procedure, curettage is performed to examine tissues from  the uterus. A diagnostic curettage may be performed for the following symptoms:   Irregular bleeding in the uterus.   Bleeding with the development of clots.   Spotting between menstrual periods.   Prolonged menstrual periods.   Bleeding after menopause.   No menstrual period (amenorrhea).   A change in size and shape of the uterus.  As a treatment procedure, curettage may be performed for the following reasons:   Removal of an IUD (intrauterine device).   Removal of retained placenta after giving birth. Retained placenta can cause an infection or bleeding severe enough to require transfusions.   Abortion.   Miscarriage.   Removal of polyps inside the uterus.   Removal of uncommon types of noncancerous lumps (fibroids).  LET [facility] CARE PROVIDER KNOW ABOUT:   Any allergies you have.   All medicines you are taking, including vitamins, herbs, eye drops, creams, and over-the-counter medicines.   Previous problems you or  members of your family have had with the use of anesthetics.   Any blood disorders you have.   Previous surgeries you have had.   Medical conditions you have. RISKS AND COMPLICATIONS  Generally, this is a safe procedure. However, as with any procedure, complications can occur. Possible complications include:  Excessive bleeding.   Infection of the uterus.   Damage to the cervix.   Development of scar tissue (adhesions) inside the uterus, later causing abnormal amounts of menstrual bleeding.   Complications from the general anesthetic, if a general anesthetic is used.   Putting a hole (perforation) in the uterus. This is rare.  BEFORE THE PROCEDURE   Eat and drink before the procedure only as directed by your health care provider.   Arrange for someone to take you home.  PROCEDURE  This procedure usually takes about 15-30 minutes.  You will be given one of the following:  A medicine that numbs the area in and  around the cervix (local anesthetic).   A medicine to make you sleep through the procedure (general anesthetic).  You will lie on your back with your legs in stirrups.   A warm metal or plastic instrument (speculum) will be placed in your vagina to keep it open and to allow the health care provider to see the cervix.  There are two ways in which your cervix can be softened and dilated. These include:   Taking a medicine.   Having thin rods (laminaria) inserted into your cervix.   A curved tool (curette) will be used to scrape cells from the inside lining of the uterus. In some cases, gentle suction is applied with the curette. The curette will then be removed.  AFTER THE PROCEDURE   You will rest in the recovery area until you are stable and are ready to go home.   You may feel sick to your stomach (nauseous) or throw up (vomit) if you were given a general anesthetic.   You may have a sore throat if a tube was placed in your throat during general anesthesia.   You may have light cramping and bleeding. This may last for 2 days to 2 weeks after the procedure.   Your uterus needs to make a new lining after the procedure. This may make your next period late. Document Released: 03/12/2005 Document Revised: 11/12/2012 Document Reviewed: 10/09/2012 [facility] Patient Information 2015 [place], [place]. This information is not intended to replace advice given to you by your health care provider. Make sure you discuss any questions you have with your health care provider. PATIENT INSTRUCTIONS POST-ANESTHESIA  IMMEDIATELY FOLLOWING SURGERY:  Do not drive or operate machinery for the first twenty four hours after surgery.  Do not make any important decisions for twenty four hours after surgery or while taking narcotic pain medications or sedatives.  If you develop intractable nausea and vomiting or a severe headache please notify your doctor immediately.  FOLLOW-UP:  Please make an  appointment with your surgeon as instructed. You do not need to follow up with anesthesia unless specifically instructed to do so.  WOUND CARE INSTRUCTIONS (if applicable):  Keep a dry clean dressing on the anesthesia/puncture wound site if there is drainage.  Once the wound has quit draining you may leave it open to air.  Generally you should leave the bandage intact for twenty four hours unless there is drainage.  If the epidural site drains for more than 36-48 hours please call the anesthesia department.  QUESTIONS?:  Please  feel free to call your physician or the hospital operator if you have any questions, and they will be happy to assist you.

## 2014-07-05 ENCOUNTER — Telehealth: Payer: Self-pay | Admitting: Obstetrics and Gynecology

## 2014-07-05 ENCOUNTER — Encounter (HOSPITAL_COMMUNITY)
Admission: RE | Admit: 2014-07-05 | Discharge: 2014-07-05 | Disposition: A | Discharge status: Home / Self Care | Payer: BLUE CROSS/BLUE SHIELD | Source: Ambulatory Visit | Attending: Obstetrics and Gynecology | Admitting: Obstetrics and Gynecology

## 2014-07-05 NOTE — Telephone Encounter (Signed)
Dr. Glo Herring is aware that pt cancelled surgery .

## 2014-07-06 ENCOUNTER — Encounter (HOSPITAL_COMMUNITY): Admission: RE | Payer: Self-pay | Source: Ambulatory Visit

## 2014-07-06 ENCOUNTER — Ambulatory Visit (HOSPITAL_COMMUNITY)
Admission: RE | Admit: 2014-07-06 | Payer: BLUE CROSS/BLUE SHIELD | Source: Ambulatory Visit | Admitting: Obstetrics and Gynecology

## 2014-07-06 SURGERY — DILATATION AND CURETTAGE /HYSTEROSCOPY
Anesthesia: General

## 2014-07-13 ENCOUNTER — Encounter: Payer: BLUE CROSS/BLUE SHIELD | Admitting: Obstetrics and Gynecology

## 2014-07-14 ENCOUNTER — Other Ambulatory Visit: Payer: Self-pay | Admitting: Family Medicine

## 2014-09-21 ENCOUNTER — Ambulatory Visit: Payer: BLUE CROSS/BLUE SHIELD | Admitting: Family Medicine

## 2014-09-21 ENCOUNTER — Encounter: Payer: Self-pay | Admitting: *Deleted

## 2014-11-09 ENCOUNTER — Telehealth: Payer: Self-pay | Admitting: *Deleted

## 2014-11-09 DIAGNOSIS — R7301 Impaired fasting glucose: Secondary | ICD-10-CM

## 2014-11-09 DIAGNOSIS — Z13220 Encounter for screening for lipoid disorders: Secondary | ICD-10-CM

## 2014-11-09 DIAGNOSIS — I10 Essential (primary) hypertension: Secondary | ICD-10-CM

## 2014-11-09 DIAGNOSIS — Z1159 Encounter for screening for other viral diseases: Secondary | ICD-10-CM

## 2014-11-09 NOTE — Telephone Encounter (Signed)
Pt called stating she needs her fluid pills refilled

## 2014-11-09 NOTE — Telephone Encounter (Signed)
Pt made a appt 11/22/14 at 2:45 and she is requesting her lab order to be faxed to [facility]

## 2014-11-10 ENCOUNTER — Other Ambulatory Visit: Payer: Self-pay | Admitting: Family Medicine

## 2014-11-10 NOTE — Telephone Encounter (Signed)
Labs faxed

## 2014-11-14 LAB — HEMOGLOBIN A1C
Hgb A1c MFr Bld: 6.1 % — ABNORMAL HIGH (ref <5.7)
Mean Plasma Glucose: 128 mg/dL — ABNORMAL HIGH (ref <117)

## 2014-11-14 LAB — COMPLETE METABOLIC PANEL WITH GFR
ALBUMIN: 4.1 g/dL (ref 3.6–5.1)
ALK PHOS: 68 U/L (ref 33–130)
ALT: 13 U/L (ref 6–29)
AST: 13 U/L (ref 10–35)
BUN: 16 mg/dL (ref 7–25)
CALCIUM: 9.4 mg/dL (ref 8.6–10.4)
CHLORIDE: 103 mmol/L (ref 98–110)
CO2: 30 mmol/L (ref 20–31)
Creat: 0.68 mg/dL (ref 0.50–1.05)
GFR, Est African American: >89 mL/min (ref >=60)
GLUCOSE: 100 mg/dL — AB (ref 65–99)
POTASSIUM: 3.8 mmol/L (ref 3.5–5.3)
SODIUM: 141 mmol/L (ref 135–146)
Total Bilirubin: 0.4 mg/dL (ref 0.2–1.2)
Total Protein: 6.8 g/dL (ref 6.1–8.1)

## 2014-11-14 LAB — LIPID PANEL
CHOL/HDL RATIO: 2.5 ratio (ref <=5.0)
CHOLESTEROL: 180 mg/dL (ref 125–200)
HDL: 71 mg/dL (ref >=46)
LDL Cholesterol: 96 mg/dL (ref <130)
Triglycerides: 64 mg/dL (ref <150)
VLDL: 13 mg/dL (ref <30)

## 2014-11-14 LAB — HIV ANTIBODY (ROUTINE TESTING W REFLEX): HIV 1&2 Ab, 4th Generation: NONREACTIVE

## 2014-11-22 ENCOUNTER — Encounter: Payer: Self-pay | Admitting: Family Medicine

## 2014-11-22 ENCOUNTER — Ambulatory Visit (INDEPENDENT_AMBULATORY_CARE_PROVIDER_SITE_OTHER): Payer: BLUE CROSS/BLUE SHIELD | Admitting: Family Medicine

## 2014-11-22 VITALS — BP 138/84 | HR 80 | Resp 16 | Ht 62.0 in | Wt 173.0 lb

## 2014-11-22 DIAGNOSIS — Z1159 Encounter for screening for other viral diseases: Secondary | ICD-10-CM

## 2014-11-22 DIAGNOSIS — J4520 Mild intermittent asthma, uncomplicated: Secondary | ICD-10-CM

## 2014-11-22 DIAGNOSIS — E669 Obesity, unspecified: Secondary | ICD-10-CM

## 2014-11-22 DIAGNOSIS — F17200 Nicotine dependence, unspecified, uncomplicated: Secondary | ICD-10-CM

## 2014-11-22 DIAGNOSIS — M5090 Cervical disc disorder, unspecified, unspecified cervical region: Secondary | ICD-10-CM

## 2014-11-22 DIAGNOSIS — Z720 Tobacco use: Secondary | ICD-10-CM

## 2014-11-22 DIAGNOSIS — R7301 Impaired fasting glucose: Secondary | ICD-10-CM

## 2014-11-22 DIAGNOSIS — M79601 Pain in right arm: Secondary | ICD-10-CM | POA: Diagnosis not present

## 2014-11-22 DIAGNOSIS — I10 Essential (primary) hypertension: Secondary | ICD-10-CM

## 2014-11-22 MED ORDER — PREDNISONE 5 MG (21) PO TBPK: 5.0000 mg | ORAL_TABLET | ORAL | Status: DC

## 2014-11-22 MED ORDER — METHYLPREDNISOLONE ACETATE 80 MG/ML IJ SUSP
80.0000 mg | Freq: Once | INTRAMUSCULAR | Status: AC
  Administered 2014-11-22: 80 mg via INTRAMUSCULAR

## 2014-11-22 MED ORDER — KETOROLAC TROMETHAMINE 60 MG/2ML IM SOLN
60.0000 mg | Freq: Once | INTRAMUSCULAR | Status: AC
  Administered 2014-11-22: 60 mg via INTRAMUSCULAR

## 2014-11-22 MED ORDER — IBUPROFEN 800 MG PO TABS: 800.0000 mg | ORAL_TABLET | Freq: Three times a day (TID) | ORAL | Status: DC | PRN

## 2014-11-22 NOTE — Assessment & Plan Note (Signed)
Uncontrolled.Toradol and depo medrol administered IM in the office , to be followed by a short course of oral prednisone and NSAIDS.  

## 2014-11-22 NOTE — Patient Instructions (Addendum)
F/u in 5 month, call if you need me before  Injections in office and medication sent for right arm pain   Flu vaccine available from Monday to Thursday starting in September.  Please come and get your vaccine to protect yourself  and your family.  Smoking Cessation Quitting smoking is important to your health and has many advantages. However, it is not always easy to quit since nicotine is a very addictive drug. Oftentimes, people try 3 times or more before being able to quit. This document explains the best ways for you to prepare to quit smoking. Quitting takes hard work and a lot of effort, but you can do it. ADVANTAGES OF QUITTING SMOKING  You will live longer, feel better, and live better.  Your body will feel the impact of quitting smoking almost immediately.  Within 20 minutes, blood pressure decreases. Your pulse returns to its normal level.  After 8 hours, carbon monoxide levels in the blood return to normal. Your oxygen level increases.  After 24 hours, the chance of having a heart attack starts to decrease. Your breath, hair, and body stop smelling like smoke.  After 48 hours, damaged nerve endings begin to recover. Your sense of taste and smell improve.  After 72 hours, the body is virtually free of nicotine. Your bronchial tubes relax and breathing becomes easier.  After 2 to 12 weeks, lungs can hold more air. Exercise becomes easier and circulation improves.  The risk of having a heart attack, stroke, cancer, or lung disease is greatly reduced.  After 1 year, the risk of coronary heart disease is cut in half.  After 5 years, the risk of stroke falls to the same as a nonsmoker.  After 10 years, the risk of lung cancer is cut in half and the risk of other cancers decreases significantly.  After 15 years, the risk of coronary heart disease drops, usually to the level of a nonsmoker.  If you are pregnant, quitting smoking will improve your chances of having a healthy  baby.  The people you live with, especially any children, will be healthier.  You will have extra money to spend on things other than cigarettes. QUESTIONS TO THINK ABOUT BEFORE ATTEMPTING TO QUIT You may want to talk about your answers with your health care provider.  Why do you want to quit?  If you tried to quit in the past, what helped and what did not?  What will be the most difficult situations for you after you quit? How will you plan to handle them?  Who can help you through the tough times? Your family? Friends? A health care provider?  What pleasures do you get from smoking? What ways can you still get pleasure if you quit? Here are some questions to ask your health care provider:  How can you help me to be successful at quitting?  What medicine do you think would be best for me and how should I take it?  What should I do if I need more help?  What is smoking withdrawal like? How can I get information on withdrawal? GET READY  Set a quit date.  Change your environment by getting rid of all cigarettes, ashtrays, matches, and lighters in your home, car, or work. Do not let people smoke in your home.  Review your past attempts to quit. Think about what worked and what did not. GET SUPPORT AND ENCOURAGEMENT You have a better chance of being successful if you have help. You can  get support in many ways.  Tell your family, friends, and coworkers that you are going to quit and need their support. Ask them not to smoke around you.  Get individual, group, or telephone counseling and support. Programs are available at [employer] and health centers. Call your local health department for information about programs in your area.  Spiritual beliefs and practices may help some smokers quit.  Download a "quit meter" on your computer to keep track of quit statistics, such as how long you have gone without smoking, cigarettes not smoked, and money saved.  Get a self-help book  about quitting smoking and staying off tobacco. [place] yourself from urges to smoke. Talk to someone, go for a walk, or occupy your time with a task.  Change your normal routine. Take a different route to work. Drink tea instead of coffee. Eat breakfast in a different place.  Reduce your stress. Take a hot bath, exercise, or read a book.  Plan something enjoyable to do every day. Reward yourself for not smoking.  Explore interactive web-based programs that specialize in helping you quit. GET MEDICINE AND USE IT CORRECTLY Medicines can help you stop smoking and decrease the urge to smoke. Combining medicine with the above behavioral methods and support can greatly increase your chances of successfully quitting smoking.  Nicotine replacement therapy helps deliver nicotine to your body without the negative effects and risks of smoking. Nicotine replacement therapy includes nicotine gum, lozenges, inhalers, nasal sprays, and skin patches. Some may be available over-the-counter and others require a prescription.  Antidepressant medicine helps people abstain from smoking, but how this works is unknown. This medicine is available by prescription.  Nicotinic receptor partial agonist medicine simulates the effect of nicotine in your brain. This medicine is available by prescription. Ask your health care provider for advice about which medicines to use and how to use them based on your health history. Your health care provider will tell you what side effects to look out for if you choose to be on a medicine or therapy. Carefully read the information on the package. Do not use any other product containing nicotine while using a nicotine replacement product.  RELAPSE OR DIFFICULT SITUATIONS Most relapses occur within the first 3 months after quitting. Do not be discouraged if you start smoking again. Remember, most people try several times before finally quitting. You may  have symptoms of withdrawal because your body is used to nicotine. You may crave cigarettes, be irritable, feel very hungry, cough often, get headaches, or have difficulty concentrating. The withdrawal symptoms are only temporary. They are strongest when you first quit, but they will go away within 10-14 days. To reduce the chances of relapse, try to:  Avoid drinking alcohol. Drinking lowers your chances of successfully quitting.  Reduce the amount of caffeine you consume. Once you quit smoking, the amount of caffeine in your body increases and can give you symptoms, such as a rapid heartbeat, sweating, and anxiety.  Avoid smokers because they can make you want to smoke.  Do not let weight gain distract you. Many smokers will gain weight when they quit, usually less than 10 pounds. Eat a healthy diet and stay active. You can always lose the weight gained after you quit.  Find ways to improve your mood other than smoking. FOR MORE INFORMATION  www.smokefree.gov  Document Released: 03/06/2001 Document Revised: 07/27/2013 Document Reviewed: 06/21/2011 [facility] Patient Information 2015 [place], [place]. This information is  not intended to replace advice given to you by your health care provider. Make sure you discuss any questions you have with your health care provider. Smoking Cessation, Tips for Success If you are ready to quit smoking, congratulations! You have chosen to help yourself be healthier. Cigarettes bring nicotine, tar, carbon monoxide, and other irritants into your body. Your lungs, heart, and blood vessels will be able to work better without these poisons. There are many different ways to quit smoking. Nicotine gum, nicotine patches, a nicotine inhaler, or nicotine nasal spray can help with physical craving. Hypnosis, support groups, and medicines help break the habit of smoking. WHAT THINGS CAN I DO TO MAKE QUITTING EASIER?  Here are some tips to help you quit for good:  Pick a date  when you will quit smoking completely. Tell all of your friends and family about your plan to quit on that date.  Do not try to slowly cut down on the number of cigarettes you are smoking. Pick a quit date and quit smoking completely starting on that day.  Throw away all cigarettes.   Clean and remove all ashtrays from your home, work, and car.  On a card, write down your reasons for quitting. Carry the card with you and read it when you get the urge to smoke.  Cleanse your body of nicotine. Drink enough water and fluids to keep your urine clear or pale yellow. Do this after quitting to flush the nicotine from your body.  Learn to predict your moods. Do not let a bad situation be your excuse to have a cigarette. Some situations in your life might tempt you into wanting a cigarette.  Never have "just one" cigarette. It leads to wanting another and another. Remind yourself of your decision to quit.  Change habits associated with smoking. If you smoked while driving or when feeling stressed, try other activities to replace smoking. Stand up when drinking your coffee. Brush your teeth after eating. Sit in a different chair when you read the paper. Avoid alcohol while trying to quit, and try to drink fewer caffeinated beverages. Alcohol and caffeine may urge you to smoke.  Avoid foods and drinks that can trigger a desire to smoke, such as sugary or spicy foods and alcohol.  Ask people who smoke not to smoke around you.  Have something planned to do right after eating or having a cup of coffee. For example, plan to take a walk or exercise.  Try a relaxation exercise to calm you down and decrease your stress. Remember, you may be tense and nervous for the first 2 weeks after you quit, but this will pass.  Find new activities to keep your hands busy. Play with a pen, coin, or rubber band. Doodle or draw things on paper.  Brush your teeth right after eating. This will help cut down on the craving  for the taste of tobacco after meals. You can also try mouthwash.   Use oral substitutes in place of cigarettes. Try using lemon drops, carrots, cinnamon sticks, or chewing gum. Keep them handy so they are available when you have the urge to smoke.  When you have the urge to smoke, try deep breathing.  Designate your home as a nonsmoking area.  If you are a heavy smoker, ask your health care provider about a prescription for nicotine chewing gum. It can ease your withdrawal from nicotine.  Reward yourself. Set aside the cigarette money you save and buy yourself something nice.  Look for support from others. Join a support group or smoking cessation program. Ask someone at home or at work to help you with your plan to quit smoking.  Always ask yourself, "Do I need this cigarette or is this just a reflex?" Tell yourself, "Today, I choose not to smoke," or "I do not want to smoke." You are reminding yourself of your decision to quit.  Do not replace cigarette smoking with electronic cigarettes (commonly called e-cigarettes). The safety of e-cigarettes is unknown, and some may contain harmful chemicals.  If you relapse, do not give up! Plan ahead and think about what you will do the next time you get the urge to smoke. HOW WILL I FEEL WHEN I QUIT SMOKING? You may have symptoms of withdrawal because your body is used to nicotine (the addictive substance in cigarettes). You may crave cigarettes, be irritable, feel very hungry, cough often, get headaches, or have difficulty concentrating. The withdrawal symptoms are only temporary. They are strongest when you first quit but will go away within 10-14 days. When withdrawal symptoms occur, stay in control. Think about your reasons for quitting. Remind yourself that these are signs that your body is healing and getting used to being without cigarettes. Remember that withdrawal symptoms are easier to treat than the major diseases that smoking can cause.   Even after the withdrawal is over, expect periodic urges to smoke. However, these cravings are generally short lived and will go away whether you smoke or not. Do not smoke! WHAT RESOURCES ARE AVAILABLE TO HELP ME QUIT SMOKING? Your health care provider can direct you to community resources or hospitals for support, which may include:  Group support.  Education.  Hypnosis.  Therapy. Document Released: 12/09/2003 Document Revised: 07/27/2013 Document Reviewed: 08/28/2012 [facility] Patient Information 2015 [place], [place]. This information is not intended to replace advice given to you by your health care provider. Make sure you discuss any questions you have with your health care provider.

## 2014-12-07 ENCOUNTER — Other Ambulatory Visit: Payer: Self-pay | Admitting: Family Medicine

## 2014-12-19 NOTE — Assessment & Plan Note (Signed)
Patient counseled for approximately 5 minutes regarding the health risks of ongoing nicotine use, specifically all types of cancer, heart disease, stroke and respiratory failure. The options available for help with cessation ,the behavioral changes to assist the process, and the option to either gradully reduce usage  Or abruptly stop.is also discussed. Pt is also encouraged to set specific goals in number of cigarettes used daily, as well as to set a quit date.  Number of cigarettes/cigars currently smoking daily: 10 to 15  

## 2014-12-19 NOTE — Assessment & Plan Note (Signed)
No recent flare, very sporadic need for albuterol use

## 2014-12-19 NOTE — Progress Notes (Signed)
Paula Best     MRN: [id number]      DOB: 30-Oct-1955   HPI Paula Best is here for follow up and re-evaluation of chronic medical conditions, medication management and review of any available recent lab and radiology data.  Preventive health is updated, specifically  Cancer screening and Immunization.   Questions or concerns regarding consultations or procedures which the PT has had in the interim are  addressed. The PT denies any adverse reactions to current medications since the last visit.  3 day h/o right arm pain which started after mopping  ROS Denies recent fever or chills. Denies sinus pressure, nasal congestion, ear pain or sore throat. Denies chest congestion, productive cough or wheezing. Denies chest pains, palpitations and leg swelling Denies abdominal pain, nausea, vomiting,diarrhea or constipation.   Denies dysuria, frequency, hesitancy or incontinence. Denies headaches, seizures, numbness, or tingling. Denies depression, anxiety or insomnia. Denies skin break down or rash.   PE  BP 138/84 mmHg  Pulse 80  Resp 16  Ht 5\' 2"  (1.575 m)  Wt 173 lb (78.472 kg)  BMI 31.63 kg/m2  SpO2 97%  LMP 07/25/2011  Patient alert and oriented and in no cardiopulmonary distress.  HEENT: No facial asymmetry, EOMI,   oropharynx pink and moist.  Neck supple no JVD, no mass.  Chest: Clear to auscultation bilaterally.Decreased though adequate air entry  CVS: S1, S2 no murmurs, no S3.Regular rate.  ABD: Soft non tender.   Ext: No edema  MS: Adequate ROM spine, , hips and knees.Decreased ROM right upper extremity  Skin: Intact, no ulcerations or rash noted.  Psych: Good eye contact, normal affect. Memory intact not anxious or depressed appearing.  CNS: CN 2-12 intact, power,  normal throughout.no focal deficits noted.   Assessment & Plan   Arm pain, right Uncontrolled.Toradol and depo medrol administered IM in the office , to be followed by a short course of  oral prednisone and NSAIDS.   Essential hypertension Controlled, no change in medication DASH diet and commitment to daily physical activity for a minimum of 30 minutes discussed and encouraged, as a part of hypertension management. The importance of attaining a healthy weight is also discussed.  BP/Weight 11/22/2014 06/01/2014 05/19/2014 11/02/2013 11/18/2012 05/13/2012 31/51/7616  Systolic BP 073 710 626 948 546 270 350  Diastolic BP 84 90 82 82 70 82 68  Wt. (Lbs) 173 173.5 179 168 167 165.04 163.04  BMI 31.63 31.73 32.73 30.72 30.54 30.18 29.81        Asthma, mild intermittent No recent flare, very sporadic need for albuterol use  IFG (impaired fasting glucose) Patient educated about the importance of limiting  Carbohydrate intake , the need to commit to daily physical activity for a minimum of 30 minutes , and to commit weight loss. The fact that changes in all these areas will reduce or eliminate all together the development of diabetes is stressed.  deteriorated  Diabetic Labs Latest Ref Rng 11/13/2014 11/18/2012 08/28/2011 02/24/2011 10/06/2009  HbA1c <5.7 % 6.1(H) 6.0(H) 6.0(H) 6.1(H) -  Chol 125 - 200 mg/dL 180 182 - 165 -  HDL >=46 mg/dL 71 65 - 60 -  Calc LDL <130 mg/dL 96 91 - 99 -  Triglycerides <150 mg/dL 64 128 - 28 -  Creatinine 0.50 - 1.05 mg/dL 0.68 0.65 0.77 - 0.80   BP/Weight 11/22/2014 06/01/2014 05/19/2014 11/02/2013 11/18/2012 05/13/2012 09/38/1829  Systolic BP 937 169 678 938 101 751 025  Diastolic BP 84 90 82 82  70 82 68  Wt. (Lbs) 173 173.5 179 168 167 165.04 163.04  BMI 31.63 31.73 32.73 30.72 30.54 30.18 29.81   No flowsheet data found.     Cervical neck pain with evidence of disc disease Acute flare resulting in RUE pain  Obesity (BMI 30.0-34.9) Unchanged. Patient re-educated about  the importance of commitment to a  minimum of 150 minutes of exercise per week.  The importance of healthy food choices with portion control discussed. Encouraged to start a  food diary, count calories and to consider  joining a support group. Sample diet sheets offered. Goals set by the patient for the next several months.   Weight /BMI 11/22/2014 06/01/2014 05/19/2014  WEIGHT 173 lb 173 lb 8 oz 179 lb  HEIGHT 5\' 2"  5\' 2"  5\' 2"   BMI 31.63 kg/m2 31.73 kg/m2 32.73 kg/m2    Current exercise per week 90 minutes.   TOBACCO USER Patient counseled for approximately 5 minutes regarding the health risks of ongoing nicotine use, specifically all types of cancer, heart disease, stroke and respiratory failure. The options available for help with cessation ,the behavioral changes to assist the process, and the option to either gradully reduce usage  Or abruptly stop.is also discussed. Pt is also encouraged to set specific goals in number of cigarettes used daily, as well as to set a quit date.  Number of cigarettes/cigars currently smoking daily: 10 to 15

## 2014-12-19 NOTE — Assessment & Plan Note (Signed)
Unchanged. Patient re-educated about  the importance of commitment to a  minimum of 150 minutes of exercise per week.  The importance of healthy food choices with portion control discussed. Encouraged to start a food diary, count calories and to consider  joining a support group. Sample diet sheets offered. Goals set by the patient for the next several months.   Weight /BMI 11/22/2014 06/01/2014 05/19/2014  WEIGHT 173 lb 173 lb 8 oz 179 lb  HEIGHT 5\' 2"  5\' 2"  5\' 2"   BMI 31.63 kg/m2 31.73 kg/m2 32.73 kg/m2    Current exercise per week 90 minutes.

## 2014-12-19 NOTE — Assessment & Plan Note (Signed)
Patient educated about the importance of limiting  Carbohydrate intake , the need to commit to daily physical activity for a minimum of 30 minutes , and to commit weight loss. The fact that changes in all these areas will reduce or eliminate all together the development of diabetes is stressed.  deteriorated  Diabetic Labs Latest Ref Rng 11/13/2014 11/18/2012 08/28/2011 02/24/2011 10/06/2009  HbA1c <5.7 % 6.1(H) 6.0(H) 6.0(H) 6.1(H) -  Chol 125 - 200 mg/dL 180 182 - 165 -  HDL >=46 mg/dL 71 65 - 60 -  Calc LDL <130 mg/dL 96 91 - 99 -  Triglycerides <150 mg/dL 64 128 - 28 -  Creatinine 0.50 - 1.05 mg/dL 0.68 0.65 0.77 - 0.80   BP/Weight 11/22/2014 06/01/2014 05/19/2014 11/02/2013 11/18/2012 05/13/2012 09/32/3557  Systolic BP 322 025 427 062 376 283 151  Diastolic BP 84 90 82 82 70 82 68  Wt. (Lbs) 173 173.5 179 168 167 165.04 163.04  BMI 31.63 31.73 32.73 30.72 30.54 30.18 29.81   No flowsheet data found.

## 2014-12-19 NOTE — Assessment & Plan Note (Signed)
Acute flare resulting in RUE pain

## 2014-12-19 NOTE — Assessment & Plan Note (Signed)
Controlled, no change in medication DASH diet and commitment to daily physical activity for a minimum of 30 minutes discussed and encouraged, as a part of hypertension management. The importance of attaining a healthy weight is also discussed.  BP/Weight 11/22/2014 06/01/2014 05/19/2014 11/02/2013 11/18/2012 05/13/2012 75/88/3254  Systolic BP 982 641 583 094 076 808 811  Diastolic BP 84 90 82 82 70 82 68  Wt. (Lbs) 173 173.5 179 168 167 165.04 163.04  BMI 31.63 31.73 32.73 30.72 30.54 30.18 29.81

## 2015-03-07 ENCOUNTER — Other Ambulatory Visit: Payer: Self-pay

## 2015-03-07 MED ORDER — HYDROCHLOROTHIAZIDE 25 MG PO TABS: 25.0000 mg | ORAL_TABLET | Freq: Every day | ORAL | Status: DC

## 2015-04-11 ENCOUNTER — Encounter: Payer: Self-pay | Admitting: Family Medicine

## 2015-04-11 ENCOUNTER — Ambulatory Visit (INDEPENDENT_AMBULATORY_CARE_PROVIDER_SITE_OTHER): Payer: BLUE CROSS/BLUE SHIELD | Admitting: Family Medicine

## 2015-04-11 VITALS — BP 134/82 | HR 80 | Resp 18 | Ht 62.0 in | Wt 168.0 lb

## 2015-04-11 DIAGNOSIS — F17200 Nicotine dependence, unspecified, uncomplicated: Secondary | ICD-10-CM | POA: Diagnosis not present

## 2015-04-11 DIAGNOSIS — M25562 Pain in left knee: Secondary | ICD-10-CM

## 2015-04-11 DIAGNOSIS — E669 Obesity, unspecified: Secondary | ICD-10-CM

## 2015-04-11 DIAGNOSIS — R7301 Impaired fasting glucose: Secondary | ICD-10-CM

## 2015-04-11 DIAGNOSIS — I10 Essential (primary) hypertension: Secondary | ICD-10-CM

## 2015-04-11 MED ORDER — PREDNISONE 5 MG (21) PO TBPK
ORAL_TABLET | ORAL | Status: DC
Start: 2015-04-11 — End: 2016-01-31

## 2015-04-11 MED ORDER — IBUPROFEN 800 MG PO TABS: 800.0000 mg | ORAL_TABLET | Freq: Three times a day (TID) | ORAL | Status: DC | PRN

## 2015-04-11 NOTE — Progress Notes (Signed)
Subjective:    Patient ID: Paula Best, female    DOB: [birth year], 60 y.o.   MRN: [id number]  HPI   Paula Best     MRN: [id number]      DOB: Dec 09, 1955   HPI Paula Best is here for follow up and re-evaluation of chronic medical conditions, medication management and review of any available recent lab and radiology data.  Preventive health is updated, specifically  Cancer screening and Immunization.   Questions or concerns regarding consultations or procedures which the PT has had in the interim are  Addressed.Has seen urgent care about knee , no benefit, got script for brace The PT denies any adverse reactions to current medications since the last visit.  Left knee discomfort x 4 months, feels as though loose body in the knee anterior , when she walks x 4 month, not so much pain Left elbow pain and swelling x 3 weeks, no specific trauma, unable to straighten fully, no known specific aggravant  ROS Denies recent fever or chills. Denies sinus pressure, nasal congestion, ear pain or sore throat. Denies chest congestion, productive cough or wheezing. Denies chest pains, palpitations and leg swelling Denies abdominal pain, nausea, vomiting,diarrhea or constipation.   Denies dysuria, frequency, hesitancy or incontinence.  Denies headaches, seizures, numbness, or tingling. Denies depression, anxiety or insomnia. Denies skin break down or rash.   PE  BP 146/80 mmHg  Pulse 80  Resp 18  Ht 5\' 2"  (1.575 m)  Wt 168 lb (76.204 kg)  BMI 30.72 kg/m2  SpO2 97%  LMP 07/25/2011  Patient alert and oriented and in no cardiopulmonary distress.  HEENT: No facial asymmetry, EOMI,   oropharynx pink and moist.  Neck supple no JVD, no mass.  Chest: Clear to auscultation bilaterally.  CVS: S1, S2 no murmurs, no S3.Regular rate.  ABD: Soft non tender.   Ext: No edema  MS: Adequate ROM spine, shoulders, hips and reduced in left elbow. Tender area behind left knee  Skin:  Intact, no ulcerations or rash noted.  Psych: Good eye contact, normal affect. Memory intact not anxious or depressed appearing.  CNS: CN 2-12 intact, power,  normal throughout.no focal deficits noted.   Assessment & Plan   Essential hypertension Controlled, no change in medication DASH diet and commitment to daily physical activity for a minimum of 30 minutes discussed and encouraged, as a part of hypertension management. The importance of attaining a healthy weight is also discussed.  BP/Weight 04/11/2015 11/22/2014 06/01/2014 05/19/2014 11/02/2013 11/18/2012 [id number]  Systolic BP Q000111Q 0000000 [id number] 0000000 [id number]  Diastolic BP 82 84 90 82 82 70 82  Wt. (Lbs) 168 173 173.5 179 168 167 165.04  BMI 30.72 31.63 31.73 32.73 30.72 30.54 30.18        IFG (impaired fasting glucose) Updated lab needed at/ before next visit.   Obesity (BMI 30.0-34.9) Deteriorated. Patient re-educated about  the importance of commitment to a  minimum of 150 minutes of exercise per week.  The importance of healthy food choices with portion control discussed. Encouraged to start a food diary, count calories and to consider  joining a support group. Sample diet sheets offered. Goals set by the patient for the next several months.   Weight /BMI 04/11/2015 11/22/2014 06/01/2014  WEIGHT 168 lb 173 lb 173 lb 8 oz  HEIGHT 5\' 2"  5\' 2"  5\' 2"   BMI 30.72 kg/m2 31.63 kg/m2 31.73 kg/m2    Current exercise per week 90 minutes.  TOBACCO USER Patient counseled for approximately 5 minutes regarding the health risks of ongoing nicotine use, specifically all types of cancer, heart disease, stroke and respiratory failure. The options available for help with cessation ,the behavioral changes to assist the process, and the option to either gradully reduce usage  Or abruptly stop.is also discussed. Pt is also encouraged to set specific goals in number of cigarettes used daily, as well as to set a quit date.  Number of  cigarettes/cigars currently smoking daily:2   Knee pain, left Left knee and elbow pain for several weeks, and months, will try anti inlflammatories initially, if persists, will call back for ortho eval      Review of Systems     Objective:   Physical Exam        Assessment & Plan:

## 2015-04-11 NOTE — Assessment & Plan Note (Signed)

## 2015-04-11 NOTE — Assessment & Plan Note (Signed)
Deteriorated. Patient re-educated about  the importance of commitment to a  minimum of 150 minutes of exercise per week.  The importance of healthy food choices with portion control discussed. Encouraged to start a food diary, count calories and to consider  joining a support group. Sample diet sheets offered. Goals set by the patient for the next several months.   Weight /BMI 04/11/2015 11/22/2014 06/01/2014  WEIGHT 168 lb 173 lb 173 lb 8 oz  HEIGHT 5\' 2"  5\' 2"  5\' 2"   BMI 30.72 kg/m2 31.63 kg/m2 31.73 kg/m2    Current exercise per week 90 minutes.

## 2015-04-11 NOTE — Assessment & Plan Note (Signed)
Controlled, no change in medication DASH diet and commitment to daily physical activity for a minimum of 30 minutes discussed and encouraged, as a part of hypertension management. The importance of attaining a healthy weight is also discussed.  BP/Weight 04/11/2015 11/22/2014 06/01/2014 05/19/2014 11/02/2013 11/18/2012 [id number]  Systolic BP Q000111Q 0000000 [id number] A999333 0000000 [id number]  Diastolic BP 82 84 90 82 82 70 82  Wt. (Lbs) 168 173 173.5 179 168 167 165.04  BMI 30.72 31.63 31.73 32.73 30.72 30.54 30.18

## 2015-04-11 NOTE — Assessment & Plan Note (Signed)
Updated lab needed at/ before next visit.   

## 2015-04-11 NOTE — Assessment & Plan Note (Signed)
Left knee and elbow pain for several weeks, and months, will try anti inlflammatories initially, if persists, will call back for ortho eval

## 2015-04-11 NOTE — Patient Instructions (Addendum)
CPE in 5 month, call if you need me sooner  Labs today PLEASE  Ibuprofen and prednisone sent for left knee and elbow pain, call back if it persists, you will be referrd to orthopedics.Xrays will be done in ortho office as we discussed  Reconsider flu vaccine you need this  Since you only smoke 2 cigarettes daily, for [employer] reasons make today your QUIT DAY, if at alll possible please, the sooner the better  Thanks for choosing [facility], we consider it a privelige to serve you.

## 2015-04-12 ENCOUNTER — Other Ambulatory Visit: Payer: Self-pay | Admitting: Family Medicine

## 2015-04-12 LAB — BASIC METABOLIC PANEL
BUN: 12 mg/dL (ref 7–25)
CHLORIDE: 103 mmol/L (ref 98–110)
CO2: 29 mmol/L (ref 20–31)
Calcium: 9.5 mg/dL (ref 8.6–10.4)
Creat: 0.51 mg/dL (ref 0.50–1.05)
GLUCOSE: 88 mg/dL (ref 65–99)
POTASSIUM: 3.4 mmol/L — AB (ref 3.5–5.3)
Sodium: 140 mmol/L (ref 135–146)

## 2015-04-12 LAB — CBC
HEMATOCRIT: 41.6 % (ref 36.0–46.0)
HEMOGLOBIN: 13.9 g/dL (ref 12.0–15.0)
MCH: 29.6 pg (ref 26.0–34.0)
MCHC: 33.4 g/dL (ref 30.0–36.0)
MCV: 88.5 fL (ref 78.0–100.0)
MPV: 8.8 fL (ref 8.6–12.4)
Platelets: 348 10*3/uL (ref 150–400)
RBC: 4.7 MIL/uL (ref 3.87–5.11)
RDW: 13.7 % (ref 11.5–15.5)
WBC: 9.4 10*3/uL (ref 4.0–10.5)

## 2015-04-12 LAB — HEMOGLOBIN A1C
Hgb A1c MFr Bld: 6.1 % — ABNORMAL HIGH (ref <5.7)
MEAN PLASMA GLUCOSE: 128 mg/dL — AB (ref <117)

## 2015-04-12 LAB — HEPATITIS C ANTIBODY: HCV Ab: NEGATIVE

## 2015-04-12 LAB — TSH: TSH: 2.131 u[IU]/mL (ref 0.350–4.500)

## 2015-04-26 NOTE — Addendum Note (Signed)
Addended by: Denman George B on: 04/26/2015 11:36 AM   Modules accepted: Orders

## 2015-04-29 ENCOUNTER — Other Ambulatory Visit: Payer: Self-pay | Admitting: Family Medicine

## 2015-06-13 ENCOUNTER — Telehealth: Payer: Self-pay | Admitting: Family Medicine

## 2015-06-13 ENCOUNTER — Other Ambulatory Visit: Payer: Self-pay | Admitting: Family Medicine

## 2015-06-13 MED ORDER — POTASSIUM CHLORIDE ER 10 MEQ PO TBCR
10.0000 meq | EXTENDED_RELEASE_TABLET | Freq: Every day | ORAL | Status: DC
Start: 2015-06-13 — End: 2016-01-31

## 2015-06-13 NOTE — Telephone Encounter (Signed)
Patient is asking for lab results and also she is just getting over a virus and her body is sore and she is asking if something can be called in for her, please advise?

## 2015-06-13 NOTE — Telephone Encounter (Signed)
Called patient and left a message to call back.

## 2015-06-13 NOTE — Telephone Encounter (Signed)
Patient aware, med sent and lab order mailed

## 2015-09-04 ENCOUNTER — Other Ambulatory Visit: Payer: Self-pay | Admitting: Family Medicine

## 2015-09-12 ENCOUNTER — Encounter: Payer: BLUE CROSS/BLUE SHIELD | Admitting: Family Medicine

## 2015-09-29 ENCOUNTER — Other Ambulatory Visit: Payer: Self-pay | Admitting: Family Medicine

## 2015-10-06 ENCOUNTER — Other Ambulatory Visit: Payer: Self-pay | Admitting: Family Medicine

## 2015-11-07 ENCOUNTER — Other Ambulatory Visit: Payer: Self-pay | Admitting: Family Medicine

## 2015-11-07 DIAGNOSIS — Z1231 Encounter for screening mammogram for malignant neoplasm of breast: Secondary | ICD-10-CM

## 2015-11-14 ENCOUNTER — Ambulatory Visit (HOSPITAL_COMMUNITY)
Admission: RE | Admit: 2015-11-14 | Discharge: 2015-11-14 | Disposition: A | Discharge status: Home / Self Care | Payer: BLUE CROSS/BLUE SHIELD | Source: Ambulatory Visit | Attending: Family Medicine | Admitting: Family Medicine

## 2015-11-14 DIAGNOSIS — Z1231 Encounter for screening mammogram for malignant neoplasm of breast: Secondary | ICD-10-CM | POA: Diagnosis not present

## 2016-01-31 ENCOUNTER — Ambulatory Visit (INDEPENDENT_AMBULATORY_CARE_PROVIDER_SITE_OTHER): Payer: BLUE CROSS/BLUE SHIELD | Admitting: Family Medicine

## 2016-01-31 ENCOUNTER — Encounter: Payer: Self-pay | Admitting: Family Medicine

## 2016-01-31 VITALS — BP 180/110 | HR 65 | Resp 14 | Ht 62.0 in | Wt 167.0 lb

## 2016-01-31 DIAGNOSIS — R7301 Impaired fasting glucose: Secondary | ICD-10-CM

## 2016-01-31 DIAGNOSIS — Z0000 Encounter for general adult medical examination without abnormal findings: Secondary | ICD-10-CM | POA: Diagnosis not present

## 2016-01-31 DIAGNOSIS — E559 Vitamin D deficiency, unspecified: Secondary | ICD-10-CM

## 2016-01-31 DIAGNOSIS — I10 Essential (primary) hypertension: Secondary | ICD-10-CM | POA: Diagnosis not present

## 2016-01-31 DIAGNOSIS — Z1211 Encounter for screening for malignant neoplasm of colon: Secondary | ICD-10-CM | POA: Diagnosis not present

## 2016-01-31 DIAGNOSIS — F17200 Nicotine dependence, unspecified, uncomplicated: Secondary | ICD-10-CM

## 2016-01-31 LAB — POC HEMOCCULT BLD/STL (OFFICE/1-CARD/DIAGNOSTIC): Fecal Occult Blood, POC: NEGATIVE

## 2016-01-31 MED ORDER — AMLODIPINE BESYLATE 10 MG PO TABS: 10.0000 mg | ORAL_TABLET | Freq: Every day | ORAL | 5 refills | Status: DC

## 2016-01-31 MED ORDER — POTASSIUM CHLORIDE ER 10 MEQ PO TBCR: 10.0000 meq | EXTENDED_RELEASE_TABLET | Freq: Every day | ORAL | 5 refills | Status: DC

## 2016-01-31 MED ORDER — HYDROCHLOROTHIAZIDE 25 MG PO TABS: ORAL_TABLET | ORAL | 5 refills | Status: DC

## 2016-01-31 NOTE — Patient Instructions (Signed)
Nurse bP check in 4 weeks  MD follow up in 3 months  Reconsider flu vaccine, and come in for it!  Fasting labs this week    BP dangerously high, you NEED to STAY on medications for your blood pressure  QUIT dATE for smoking is March 26, 2016  Call [place] for 24/7 support

## 2016-02-01 ENCOUNTER — Encounter: Payer: Self-pay | Admitting: Family Medicine

## 2016-02-01 NOTE — Progress Notes (Signed)
    Paula Best     MRN: [id number]      DOB: 03/26/1956  HPI: Patient is in for annual physical exam. Stopped medication approximately 1 month ago, thought it was causing "blurry vision". BP markedly elevated, denies chest pain , palpitation, PND or orthopnea, denies localized weakness or numbness Labs need updating Immunization is reviewed , refuses influenza vaccine , which she needs   PE: Pleasant  female, alert and oriented x 3, in no cardio-pulmonary distress. Afebrile. HEENT No facial trauma or asymetry. Sinuses non tender.  Extra occullar muscles intact, pupils equally reactive to light. External ears normal, tympanic membranes clear. Oropharynx moist, no exudate. Neck: supple, no adenopathy,JVD or thyromegaly.No bruits.  Chest: Clear to ascultation bilaterally.No crackles or wheezes. Non tender to palpation  Breast: No asymetry,no masses or lumps. No tenderness. No nipple discharge or inversion. No axillary or supraclavicular adenopathy  Cardiovascular system; Heart sounds normal,  S1 and  S2 ,no S3.  No murmur, or thrill. Apical beat not displaced Peripheral pulses normal.  Abdomen: Soft, non tender, no organomegaly or masses. No bruits. Bowel sounds normal. No guarding, tenderness or rebound.  Rectal:  Normal sphincter tone. No rectal mass. Guaiac negative stool.  GU: Deferred, pt choice, and denies any pelvic pressure, pain or discharge Musculoskeletal exam: Full ROM of spine, hips , shoulders and knees. No deformity ,swelling or crepitus noted. No muscle wasting or atrophy.   Neurologic: Cranial nerves 2 to 12 intact. Power, tone ,sensation and reflexes normal throughout. No disturbance in gait. No tremor.  Skin: Intact, no ulceration, erythema , scaling or rash noted. Pigmentation normal throughout  Psych; Normal mood and affect. Judgement and concentration normal   Assessment & Plan:  Annual physical exam Annual exam as  documented. Counseling done  re healthy lifestyle involving commitment to 150 minutes exercise per week, heart healthy diet, and attaining healthy weight.The importance of adequate sleep also discussed. Regular seat belt use and home safety, is also discussed. Changes in health habits are decided on by the patient with goals and time frames  set for achieving them. Immunization and cancer screening needs are specifically addressed at this visit.   TOBACCO USER Patient counseled for approximately 5 minutes regarding the health risks of ongoing nicotine use, specifically all types of cancer, heart disease, stroke and respiratory failure. The options available for help with cessation ,the behavioral changes to assist the process, and the option to either gradully reduce usage  Or abruptly stop.is also discussed. Pt is also encouraged to set specific goals in number of cigarettes used daily, as well as to set a quit date.     Essential hypertension Uncontrolled due to non compliance Pt ed for 5 mins. Resume meds , recheck in 4 weeks DASH diet and commitment to daily physical activity for a minimum of 30 minutes discussed and encouraged, as a part of hypertension management. The importance of attaining a healthy weight is also discussed.  BP/Weight 01/31/2016 04/11/2015 11/22/2014 06/01/2014 05/19/2014 11/02/2013 [id number]  Systolic BP 99991111 Q000111Q 0000000 [id number] A999333 0000000 A999333  Diastolic BP A999333 82 84 90 82 82 70  Wt. (Lbs) 167 168 173 173.5 179 168 167  BMI 30.54 30.72 31.63 31.73 32.73 30.72 30.54

## 2016-02-01 NOTE — Assessment & Plan Note (Signed)

## 2016-02-01 NOTE — Assessment & Plan Note (Signed)

## 2016-02-01 NOTE — Assessment & Plan Note (Signed)
Uncontrolled due to non compliance Pt ed for 5 mins. Resume meds , recheck in 4 weeks DASH diet and commitment to daily physical activity for a minimum of 30 minutes discussed and encouraged, as a part of hypertension management. The importance of attaining a healthy weight is also discussed.  BP/Weight 01/31/2016 04/11/2015 11/22/2014 06/01/2014 05/19/2014 11/02/2013 [id number]  Systolic BP 99991111 Q000111Q 0000000 [id number] A999333 0000000 A999333  Diastolic BP A999333 82 84 90 82 82 70  Wt. (Lbs) 167 168 173 173.5 179 168 167  BMI 30.54 30.72 31.63 31.73 32.73 30.72 30.54

## 2016-05-03 ENCOUNTER — Ambulatory Visit: Payer: BLUE CROSS/BLUE SHIELD | Admitting: Family Medicine

## 2016-05-31 ENCOUNTER — Ambulatory Visit: Payer: BLUE CROSS/BLUE SHIELD | Admitting: Family Medicine

## 2016-07-19 ENCOUNTER — Other Ambulatory Visit: Payer: Self-pay | Admitting: Family Medicine

## 2016-11-05 ENCOUNTER — Other Ambulatory Visit: Payer: Self-pay | Admitting: Family Medicine

## 2016-11-05 DIAGNOSIS — Z1231 Encounter for screening mammogram for malignant neoplasm of breast: Secondary | ICD-10-CM

## 2016-11-12 ENCOUNTER — Telehealth: Payer: Self-pay | Admitting: Family Medicine

## 2016-11-12 NOTE — Telephone Encounter (Signed)
Patient out of the following:   Amlodipine Potassium hydrochlorothiazide    [facility]

## 2016-11-13 ENCOUNTER — Other Ambulatory Visit: Payer: Self-pay

## 2016-11-13 MED ORDER — HYDROCHLOROTHIAZIDE 25 MG PO TABS: ORAL_TABLET | ORAL | 1 refills | Status: DC

## 2016-11-13 MED ORDER — AMLODIPINE BESYLATE 10 MG PO TABS: ORAL_TABLET | ORAL | 1 refills | Status: DC

## 2016-11-13 MED ORDER — POTASSIUM CHLORIDE ER 10 MEQ PO TBCR: 10.0000 meq | EXTENDED_RELEASE_TABLET | Freq: Every day | ORAL | 5 refills | Status: DC

## 2016-11-13 NOTE — Telephone Encounter (Signed)
Refills sent

## 2016-11-19 ENCOUNTER — Ambulatory Visit (HOSPITAL_COMMUNITY)
Admission: RE | Admit: 2016-11-19 | Discharge: 2016-11-19 | Disposition: A | Discharge status: Home / Self Care | Payer: BLUE CROSS/BLUE SHIELD | Source: Ambulatory Visit | Attending: Family Medicine | Admitting: Family Medicine

## 2016-11-19 DIAGNOSIS — Z1231 Encounter for screening mammogram for malignant neoplasm of breast: Secondary | ICD-10-CM | POA: Diagnosis not present

## 2016-11-22 ENCOUNTER — Ambulatory Visit: Payer: BLUE CROSS/BLUE SHIELD | Admitting: Family Medicine

## 2017-02-28 ENCOUNTER — Ambulatory Visit (INDEPENDENT_AMBULATORY_CARE_PROVIDER_SITE_OTHER): Payer: BLUE CROSS/BLUE SHIELD | Admitting: Family Medicine

## 2017-02-28 ENCOUNTER — Encounter: Payer: Self-pay | Admitting: Family Medicine

## 2017-02-28 VITALS — BP 122/80 | HR 82 | Resp 16 | Ht 62.0 in | Wt 172.0 lb

## 2017-02-28 DIAGNOSIS — G8929 Other chronic pain: Secondary | ICD-10-CM

## 2017-02-28 DIAGNOSIS — M25562 Pain in left knee: Secondary | ICD-10-CM

## 2017-02-28 DIAGNOSIS — R7301 Impaired fasting glucose: Secondary | ICD-10-CM

## 2017-02-28 DIAGNOSIS — E559 Vitamin D deficiency, unspecified: Secondary | ICD-10-CM | POA: Diagnosis not present

## 2017-02-28 DIAGNOSIS — F17200 Nicotine dependence, unspecified, uncomplicated: Secondary | ICD-10-CM | POA: Diagnosis not present

## 2017-02-28 DIAGNOSIS — I10 Essential (primary) hypertension: Secondary | ICD-10-CM | POA: Diagnosis not present

## 2017-02-28 DIAGNOSIS — E669 Obesity, unspecified: Secondary | ICD-10-CM

## 2017-02-28 NOTE — Assessment & Plan Note (Addendum)
chronic left pain , imaging study needed to further evaluate

## 2017-02-28 NOTE — Assessment & Plan Note (Signed)
Controlled, no change in medication DASH diet and commitment to daily physical activity for a minimum of 30 minutes discussed and encouraged, as a part of hypertension management. The importance of attaining a healthy weight is also discussed.  BP/Weight 02/28/2017 01/31/2016 04/11/2015 11/22/2014 06/01/2014 05/19/2014 2/59/1028  Systolic BP 902 284 069 861 483 073 543  Diastolic BP 80 014 82 84 90 82 82  Wt. (Lbs) 172 167 168 173 173.5 179 168  BMI 31.46 30.54 30.72 31.63 31.73 32.73 30.72

## 2017-02-28 NOTE — Patient Instructions (Addendum)
Physical exam with pap in April 2019,  Call if you need me before  Labs tomorrow, CBC, lipid,cmp and EGFR, TSH and vitamin D and hBA1C   Please get X ray of left knee tomorrow  Work on quitting smoking, down to 1 PPD  Shingles vaccine recommended check ins     Steps to Quit Smoking Smoking tobacco can be bad for your health. It can also affect almost every organ in your body. Smoking puts you and people around you at risk for many serious long-lasting (chronic) diseases. Quitting smoking is hard, but it is one of the best things that you can do for your health. It is never too late to quit. What are the benefits of quitting smoking? When you quit smoking, you lower your risk for getting serious diseases and conditions. They can include:  Lung cancer or lung disease.  Heart disease.  Stroke.  Heart attack.  Not being able to have children (infertility).  Weak bones (osteoporosis) and broken bones (fractures).  If you have coughing, wheezing, and shortness of breath, those symptoms may get better when you quit. You may also get sick less often. If you are pregnant, quitting smoking can help to lower your chances of having a baby of low birth weight. What can I do to help me quit smoking? Talk with your doctor about what can help you quit smoking. Some things you can do (strategies) include:  Quitting smoking totally, instead of slowly cutting back how much you smoke over a period of time.  Going to in-person counseling. You are more likely to quit if you go to many counseling sessions.  Using resources and support systems, such as: ? Database administrator with a Social worker. ? Phone quitlines. ? Careers information officer. ? Support groups or group counseling. ? Text messaging programs. ? Mobile phone apps or applications.  Taking medicines. Some of these medicines may have nicotine in them. If you are pregnant or breastfeeding, do not take any medicines to quit smoking unless your  doctor says it is okay. Talk with your doctor about counseling or other things that can help you.  Talk with your doctor about using more than one strategy at the same time, such as taking medicines while you are also going to in-person counseling. This can help make quitting easier. What things can I do to make it easier to quit? Quitting smoking might feel very hard at first, but there is a lot that you can do to make it easier. Take these steps:  Talk to your family and friends. Ask them to support and encourage you.  Call phone quitlines, reach out to support groups, or work with a Social worker.  Ask people who smoke to not smoke around you.  Avoid places that make you want (trigger) to smoke, such as: ? Bars. ? Parties. ? Smoke-break areas at work.  Spend time with people who do not smoke.  Lower the stress in your life. Stress can make you want to smoke. Try these things to help your stress: ? Getting regular exercise. ? Deep-breathing exercises. ? Yoga. ? Meditating. ? Doing a body scan. To do this, close your eyes, focus on one area of your body at a time from head to toe, and notice which parts of your body are tense. Try to relax the muscles in those areas.  Download or buy apps on your mobile phone or tablet that can help you stick to your quit plan. There are many free apps,  such as QuitGuide from the [employer] Office manager for Disease Control and Prevention). You can find more support from smokefree.gov and other websites.  This information is not intended to replace advice given to you by your health care provider. Make sure you discuss any questions you have with your health care provider. Document Released: 01/06/2009 Document Revised: 11/08/2015 Document Reviewed: 07/27/2014 Elsevier Interactive Patient Education  2018 [employer].

## 2017-03-02 ENCOUNTER — Encounter: Payer: Self-pay | Admitting: Family Medicine

## 2017-03-02 NOTE — Progress Notes (Signed)
Paula Best     MRN: [id number]      DOB: [birth year]   HPI [name] is here for follow up and re-evaluation of chronic medical conditions, medication management and review of any available recent lab and radiology data.  Preventive health is updated, specifically  Cancer screening and Immunization.   Questions or concerns regarding consultations or procedures which the PT has had in the interim are  addressed. The PT denies any adverse reactions to current medications since the last visit.  There are no new concerns.  There are no specific complaints   ROS Denies recent fever or chills. Denies sinus pressure, nasal congestion, ear pain or sore throat. Denies chest congestion, productive cough or wheezing. Denies chest pains, palpitations and leg swelling Denies abdominal pain, nausea, vomiting,diarrhea or constipation.   Denies dysuria, frequency, hesitancy or incontinence. C/o chronic left knee pain has been to urgent care for eval, never had X ray ordered 3 years ago. Denies headaches, seizures, numbness, or tingling. Denies depression, anxiety or insomnia. Denies skin break down or rash.   PE  BP 122/80   Pulse 82   Resp 16   Ht 5\' 2"  (1.575 m)   Wt 172 lb (78 kg)   LMP 07/25/2011   SpO2 94%   BMI 31.46 kg/m   Patient alert and oriented and in no cardiopulmonary distress.  HEENT: No facial asymmetry, EOMI,   oropharynx pink and moist.  Neck supple no JVD, no mass.  Chest: Clear to auscultation bilaterally.  CVS: S1, S2 no murmurs, no S3.Regular rate.  ABD: Soft non tender.   Ext: No edema  MS: Adequate ROM spine, shoulders, hips and reduced in left  knee.  Skin: Intact, no ulcerations or rash noted.  Psych: Good eye contact, normal affect. Memory intact not anxious or depressed appearing.  CNS: CN 2-12 intact, power,  normal throughout.no focal deficits noted.   Assessment & Plan  Essential hypertension Controlled, no change in  medication DASH diet and commitment to daily physical activity for a minimum of 30 minutes discussed and encouraged, as a part of hypertension management. The importance of attaining a healthy weight is also discussed.  BP/Weight 02/28/2017 01/31/2016 04/11/2015 11/22/2014 06/01/2014 05/19/2014 1/54/0086  Systolic BP 761 950 932 671 245 809 983  Diastolic BP 80 382 82 84 90 82 82  Wt. (Lbs) 172 167 168 173 173.5 179 168  BMI 31.46 30.54 30.72 31.63 31.73 32.73 30.72       Knee pain, left chronic left pain , imaging study needed to further evaluate  Obesity (BMI 30.0-34.9) Deteriorated. Patient re-educated about  the importance of commitment to a  minimum of 150 minutes of exercise per week.  The importance of healthy food choices with portion control discussed. Encouraged to start a food diary, count calories and to consider  joining a support group. Sample diet sheets offered. Goals set by the patient for the next several months.   Weight /BMI 02/28/2017 01/31/2016 04/11/2015  WEIGHT 172 lb 167 lb 168 lb  HEIGHT 5\' 2"  5\' 2"  5\' 2"   BMI 31.46 kg/m2 30.54 kg/m2 30.72 kg/m2      Vitamin D deficiency Updated lab needed   TOBACCO USER Patient is asked and  confirms current  Nicotine use.  Five to seven minutes of time is spent in counseling the patient of the need to quit smoking  Advice to quit is delivered clearly specifically in reducing the risk of developing heart disease, having a  stroke, or of developing all types of cancer, especially lung and oral cancer. Improvement in breathing and exercise tolerance and quality of life is also discussed, as is the economic benefit.  Assessment of willingness to quit or to make an attempt to quit is made and documented  Assistance in quit attempt is made with several and varied options presented, based on patient's desire and need. These include  literature, local classes available, 1800 QUIT NOW number, OTC and prescription  medication.  The GOAL to be NICOTINE FREE is re emphasized.  The patient has set a personal goal of either reduction or discontinuation and follow up is arranged between 6 an 16 weeks.    IFG (impaired fasting glucose) Patient educated about the importance of limiting  Carbohydrate intake , the need to commit to daily physical activity for a minimum of 30 minutes , and to commit weight loss. The fact that changes in all these areas will reduce or eliminate all together the development of diabetes is stressed.  Updated lab needed at/ before next visit.   Diabetic Labs Latest Ref Rng & Units 04/11/2015 11/13/2014 11/18/2012 08/28/2011 02/24/2011  HbA1c <5.7 % 6.1(H) 6.1(H) 6.0(H) 6.0(H) 6.1(H)  Chol 125 - 200 mg/dL - 180 182 - 165  HDL >=46 mg/dL - 71 65 - 60  Calc LDL <130 mg/dL - 96 91 - 99  Triglycerides <150 mg/dL - 64 128 - 28  Creatinine 0.50 - 1.05 mg/dL 0.51 0.68 0.65 0.77 -   BP/Weight 02/28/2017 01/31/2016 04/11/2015 11/22/2014 06/01/2014 05/19/2014 4/96/7591  Systolic BP 638 466 599 357 017 793 903  Diastolic BP 80 009 82 84 90 82 82  Wt. (Lbs) 172 167 168 173 173.5 179 168  BMI 31.46 30.54 30.72 31.63 31.73 32.73 30.72   No flowsheet data found.

## 2017-03-02 NOTE — Assessment & Plan Note (Signed)
Deteriorated. Patient re-educated about  the importance of commitment to a  minimum of 150 minutes of exercise per week.  The importance of healthy food choices with portion control discussed. Encouraged to start a food diary, count calories and to consider  joining a support group. Sample diet sheets offered. Goals set by the patient for the next several months.   Weight /BMI 02/28/2017 01/31/2016 04/11/2015  WEIGHT 172 lb 167 lb 168 lb  HEIGHT 5\' 2"  5\' 2"  5\' 2"   BMI 31.46 kg/m2 30.54 kg/m2 30.72 kg/m2

## 2017-03-02 NOTE — Assessment & Plan Note (Signed)
Updated lab needed.  

## 2017-03-02 NOTE — Assessment & Plan Note (Signed)
Patient educated about the importance of limiting  Carbohydrate intake , the need to commit to daily physical activity for a minimum of 30 minutes , and to commit weight loss. The fact that changes in all these areas will reduce or eliminate all together the development of diabetes is stressed.  Updated lab needed at/ before next visit.   Diabetic Labs Latest Ref Rng & Units 04/11/2015 11/13/2014 11/18/2012 08/28/2011 02/24/2011  HbA1c <5.7 % 6.1(H) 6.1(H) 6.0(H) 6.0(H) 6.1(H)  Chol 125 - 200 mg/dL - 180 182 - 165  HDL >=46 mg/dL - 71 65 - 60  Calc LDL <130 mg/dL - 96 91 - 99  Triglycerides <150 mg/dL - 64 128 - 28  Creatinine 0.50 - 1.05 mg/dL 0.51 0.68 0.65 0.77 -   BP/Weight 02/28/2017 01/31/2016 04/11/2015 11/22/2014 06/01/2014 05/19/2014 9/75/3005  Systolic BP 110 211 173 567 014 103 013  Diastolic BP 80 143 82 84 90 82 82  Wt. (Lbs) 172 167 168 173 173.5 179 168  BMI 31.46 30.54 30.72 31.63 31.73 32.73 30.72   No flowsheet data found.

## 2017-03-02 NOTE — Assessment & Plan Note (Signed)

## 2017-04-18 ENCOUNTER — Telehealth: Payer: Self-pay | Admitting: Family Medicine

## 2017-04-18 NOTE — Telephone Encounter (Signed)
Pt is calling in wanting a RX sent in for a cold, her throat hurts cough head pressure, low grade fever

## 2017-04-18 NOTE — Telephone Encounter (Signed)
Phone # patient left is an answering service.

## 2017-04-18 NOTE — Telephone Encounter (Signed)
Dr Moshe Cipro cannot treat without clinical evaluation and is out of the office. Can try urgent care

## 2017-04-20 DIAGNOSIS — H40033 Anatomical narrow angle, bilateral: Secondary | ICD-10-CM | POA: Diagnosis not present

## 2017-04-20 DIAGNOSIS — H04123 Dry eye syndrome of bilateral lacrimal glands: Secondary | ICD-10-CM | POA: Diagnosis not present

## 2017-06-11 ENCOUNTER — Other Ambulatory Visit: Payer: Self-pay | Admitting: Family Medicine

## 2017-07-04 ENCOUNTER — Encounter: Payer: BLUE CROSS/BLUE SHIELD | Admitting: Family Medicine

## 2017-07-16 ENCOUNTER — Encounter: Payer: BLUE CROSS/BLUE SHIELD | Admitting: Family Medicine

## 2017-08-27 DIAGNOSIS — E559 Vitamin D deficiency, unspecified: Secondary | ICD-10-CM | POA: Diagnosis not present

## 2017-08-27 DIAGNOSIS — R7301 Impaired fasting glucose: Secondary | ICD-10-CM | POA: Diagnosis not present

## 2017-08-27 DIAGNOSIS — I10 Essential (primary) hypertension: Secondary | ICD-10-CM | POA: Diagnosis not present

## 2017-08-28 LAB — COMPLETE METABOLIC PANEL WITH GFR
AG Ratio: 1.5 (calc) (ref 1.0–2.5)
ALKALINE PHOSPHATASE (APISO): 71 U/L (ref 33–130)
ALT: 14 U/L (ref 6–29)
AST: 11 U/L (ref 10–35)
Albumin: 4.2 g/dL (ref 3.6–5.1)
BUN: 14 mg/dL (ref 7–25)
CO2: 31 mmol/L (ref 20–32)
CREATININE: 0.71 mg/dL (ref 0.50–0.99)
Calcium: 9.6 mg/dL (ref 8.6–10.4)
Chloride: 106 mmol/L (ref 98–110)
GFR, Est African American: 107 mL/min/{1.73_m2} (ref >=60)
GFR, Est Non African American: 92 mL/min/{1.73_m2} (ref >=60)
Globulin: 2.8 g/dL (calc) (ref 1.9–3.7)
Glucose, Bld: 90 mg/dL (ref 65–99)
Potassium: 4.2 mmol/L (ref 3.5–5.3)
SODIUM: 142 mmol/L (ref 135–146)
Total Bilirubin: 0.3 mg/dL (ref 0.2–1.2)
Total Protein: 7 g/dL (ref 6.1–8.1)

## 2017-08-28 LAB — CBC
HEMATOCRIT: 41.5 % (ref 35.0–45.0)
Hemoglobin: 14.6 g/dL (ref 11.7–15.5)
MCH: 30.4 pg (ref 27.0–33.0)
MCHC: 35.2 g/dL (ref 32.0–36.0)
MCV: 86.3 fL (ref 80.0–100.0)
MPV: 9.1 fL (ref 7.5–12.5)
Platelets: 327 10*3/uL (ref 140–400)
RBC: 4.81 10*6/uL (ref 3.80–5.10)
RDW: 13 % (ref 11.0–15.0)
WBC: 8.2 10*3/uL (ref 3.8–10.8)

## 2017-08-28 LAB — HEMOGLOBIN A1C
EAG (MMOL/L): 7 (calc)
HEMOGLOBIN A1C: 6 %{Hb} — AB (ref <5.7)
MEAN PLASMA GLUCOSE: 126 (calc)

## 2017-08-28 LAB — LIPID PANEL
CHOL/HDL RATIO: 3.3 (calc) (ref <5.0)
Cholesterol: 193 mg/dL (ref <200)
HDL: 59 mg/dL (ref >50)
LDL CHOLESTEROL (CALC): 117 mg/dL — AB
Non-HDL Cholesterol (Calc): 134 mg/dL (calc) — ABNORMAL HIGH (ref <130)
Triglycerides: 78 mg/dL (ref <150)

## 2017-08-28 LAB — VITAMIN D 25 HYDROXY (VIT D DEFICIENCY, FRACTURES): Vit D, 25-Hydroxy: 23 ng/mL — ABNORMAL LOW (ref 30–100)

## 2017-08-28 LAB — TSH: TSH: 2.09 m[IU]/L (ref 0.40–4.50)

## 2017-08-29 ENCOUNTER — Ambulatory Visit (INDEPENDENT_AMBULATORY_CARE_PROVIDER_SITE_OTHER): Payer: BLUE CROSS/BLUE SHIELD | Admitting: Family Medicine

## 2017-08-29 ENCOUNTER — Other Ambulatory Visit (HOSPITAL_COMMUNITY)
Admission: RE | Admit: 2017-08-29 | Discharge: 2017-08-29 | Disposition: A | Discharge status: Home / Self Care | Payer: BLUE CROSS/BLUE SHIELD | Source: Ambulatory Visit | Attending: Family Medicine | Admitting: Family Medicine

## 2017-08-29 ENCOUNTER — Encounter: Payer: Self-pay | Admitting: Family Medicine

## 2017-08-29 VITALS — BP 128/84 | HR 98 | Resp 16 | Ht 62.0 in | Wt 178.0 lb

## 2017-08-29 DIAGNOSIS — Z0000 Encounter for general adult medical examination without abnormal findings: Secondary | ICD-10-CM | POA: Diagnosis not present

## 2017-08-29 DIAGNOSIS — F17200 Nicotine dependence, unspecified, uncomplicated: Secondary | ICD-10-CM | POA: Diagnosis not present

## 2017-08-29 DIAGNOSIS — Z124 Encounter for screening for malignant neoplasm of cervix: Secondary | ICD-10-CM | POA: Insufficient documentation

## 2017-08-29 DIAGNOSIS — E669 Obesity, unspecified: Secondary | ICD-10-CM

## 2017-08-29 DIAGNOSIS — R935 Abnormal findings on diagnostic imaging of other abdominal regions, including retroperitoneum: Secondary | ICD-10-CM

## 2017-08-29 DIAGNOSIS — Z1231 Encounter for screening mammogram for malignant neoplasm of breast: Secondary | ICD-10-CM | POA: Diagnosis not present

## 2017-08-29 NOTE — Progress Notes (Signed)
Paula Best     MRN: [id number]      DOB: 1955/07/25  HPI: Patient is in for annual physical exam. Tobacco cessation counseling done for 5 minutes, also obesity counseling H/o endometrial thickening and polyps, never followed through by patient , needs this done Recent labs,are reviewed. Immunization is reviewed , and pt still considering zostavax   PE: BP 128/84   Pulse 98   Resp 16   Ht 5\' 2"  (1.575 m)   Wt 178 lb (80.7 kg)   LMP 07/25/2011   SpO2 96%   BMI 32.56 kg/m    Pleasant  female, alert and oriented x 3, in no cardio-pulmonary distress. Afebrile. HEENT No facial trauma or asymetry. Sinuses non tender.  Extra occullar muscles intact, External ears normal, tympanic membranes clear. Oropharynx moist, no exudate. Neck: supple, no adenopathy,JVD or thyromegaly.No bruits.  Chest: Clear to ascultation bilaterally.No crackles or wheezes.Decreased though adequate air entry bilaterally.Non tender to palpation  Breast: No asymetry,no masses or lumps. No tenderness. No nipple discharge or inversion. No axillary or supraclavicular adenopathy  Cardiovascular system; Heart sounds normal,  S1 and  S2 ,no S3.  No murmur, or thrill. Apical beat not displaced Peripheral pulses normal.  Abdomen: Soft, non tender, no organomegaly or masses. No bruits. Bowel sounds normal. No guarding, tenderness or rebound.  Rectal:  deferred  GU: External genitalia normal female genitalia , normal female distribution of hair. No lesions. Urethral meatus normal in size, no  Prolapse, no lesions visibly  Present. Bladder non tender. Vagina pink and moist , with no visible lesions , discharge present . Adequate pelvic support no  cystocele or rectocele noted Cervix pink and appears healthy, no lesions or ulcerations noted, no discharge noted from os Uterus enlarged, no adnexal masses, no cervical motion or adnexal tenderness.   Musculoskeletal exam: Full ROM of spine, hips  , shoulders and knees. No deformity ,swelling or crepitus noted. No muscle wasting or atrophy.   Neurologic: Cranial nerves 2 to 12 intact. Power, tone ,sensation and reflexes normal throughout. No disturbance in gait. No tremor.  Skin: Intact, no ulceration, erythema , scaling or rash noted. Pigmentation normal throughout  Psych; Normal mood and affect. Judgement and concentration normal   Assessment & Plan:  Annual physical exam Annual exam as documented. Counseling done  re healthy lifestyle involving commitment to 150 minutes exercise per week, heart healthy diet, and attaining healthy weight.The importance of adequate sleep also discussed.  Changes in health habits are decided on by the patient with goals and time frames  set for achieving them. Immunization and cancer screening needs are specifically addressed at this visit.   TOBACCO USER Asked : confirm current cigaretrte use is 10/day Assess: trying to quit , no date set Assist: [address] [place] #, papers given and also counseled for 5 minutes Advise: needs to quit to reduce risk of lung failure, heart disease, and all types of cancer Arrange: f/u in 6 months  Obesity (BMI 30.0-34.9) Deteriorated. Patient re-educated about  the importance of commitment to a  minimum of 150 minutes of exercise per week.  The importance of healthy food choices with portion control discussed. Encouraged to start a food diary, count calories and to consider  joining a support group. Sample diet sheets offered. Goals set by the patient for the next several months.   Weight /BMI 08/29/2017 02/28/2017 01/31/2016  WEIGHT 178 lb 172 lb 167 lb  HEIGHT 5\' 2"  5\' 2"  5\' 2"   BMI 32.56 kg/m2 31.46 kg/m2 30.54 kg/m2      Abnormal endometrial ultrasound Needs repeat ultrasound, will need to contact pt about this, she never did f/u with gyne on record review, in 2015 she was scheduled for hysteroscopy and did not follow through, at that tim she was  having irregular bleeding and had polyps and endometrial thickening. Needs rept pelvic US and gyne re eval , will need to contact pt about this when her pap report is ready

## 2017-08-29 NOTE — Patient Instructions (Signed)
F/u early December, call if you need me before Please schedule mammogram at checkout  Please continue to work on cutting back smoking, need to [place], now down to 10/day From 1PPD  Please cur bakl on fried food and sweets  It is important that you exercise regularly at least 30 minutes 5 times a week. If you develop chest pain, have severe difficulty breathing, or feel very tired, stop exercising immediately and seek medical attention  .    Thank you  for choosing [facility]. We consider it a privelige to serve you.  Delivering excellent health care in a caring and  compassionate way is our goal.  Partnering with you,  so that together we can achieve this goal is our strategy.

## 2017-08-29 NOTE — Assessment & Plan Note (Addendum)
Annual exam as documented. Counseling done  re healthy lifestyle involving commitment to 150 minutes exercise per week, heart healthy diet, and attaining healthy weight.The importance of adequate sleep also discussed. Changes in health habits are decided on by the patient with goals and time frames  set for achieving them. Immunization and cancer screening needs are specifically addressed at this visit. 

## 2017-08-31 ENCOUNTER — Encounter: Payer: Self-pay | Admitting: Family Medicine

## 2017-08-31 NOTE — Assessment & Plan Note (Signed)
Asked : confirm current cigaretrte use is 10/day Assess: trying to quit , no date set Assist: [place] #, papers given and also counseled for 5 minutes Advise: needs to quit to reduce risk of lung failure, heart disease, and all types of cancer Arrange: f/u in 6 months

## 2017-08-31 NOTE — Assessment & Plan Note (Addendum)
Needs repeat ultrasound, will need to contact pt about this, she never did f/u with gyne on record review, in 2015 she was scheduled for hysteroscopy and did not follow through, at that time she was having irregular bleeding and had polyps and endometrial thickening. Needs rept pelvic US and gyne re eval , will need to contact pt about this when her pap report is ready

## 2017-08-31 NOTE — Assessment & Plan Note (Signed)
Deteriorated. Patient re-educated about  the importance of commitment to a  minimum of 150 minutes of exercise per week.  The importance of healthy food choices with portion control discussed. Encouraged to start a food diary, count calories and to consider  joining a support group. Sample diet sheets offered. Goals set by the patient for the next several months.   Weight /BMI 08/29/2017 02/28/2017 01/31/2016  WEIGHT 178 lb 172 lb 167 lb  HEIGHT 5\' 2"  5\' 2"  5\' 2"   BMI 32.56 kg/m2 31.46 kg/m2 30.54 kg/m2

## 2017-09-05 ENCOUNTER — Ambulatory Visit (HOSPITAL_COMMUNITY): Admission: RE | Admit: 2017-09-05 | Payer: BLUE CROSS/BLUE SHIELD | Source: Ambulatory Visit

## 2017-09-05 LAB — CYTOLOGY - PAP
DIAGNOSIS: NEGATIVE
HPV (WINDOPATH): NOT DETECTED

## 2017-09-09 ENCOUNTER — Other Ambulatory Visit: Payer: Self-pay | Admitting: Family Medicine

## 2017-10-02 ENCOUNTER — Other Ambulatory Visit: Payer: Self-pay | Admitting: Family Medicine

## 2017-11-14 DIAGNOSIS — H1013 Acute atopic conjunctivitis, bilateral: Secondary | ICD-10-CM | POA: Diagnosis not present

## 2017-11-27 ENCOUNTER — Ambulatory Visit (HOSPITAL_COMMUNITY): Payer: BLUE CROSS/BLUE SHIELD

## 2018-01-02 ENCOUNTER — Telehealth: Payer: Self-pay | Admitting: Family Medicine

## 2018-01-02 NOTE — Telephone Encounter (Signed)
Patient is requesting medication, patient is requesting medication for cough w/ thick yellow mucus. [facility] in [place] [address] Cb@ [phone]

## 2018-01-02 NOTE — Telephone Encounter (Signed)
Please advise 

## 2018-01-03 NOTE — Telephone Encounter (Signed)
pls call and schedule appt foir next week as a work in, she needs to be seen in the office if OTC robitussin has not been beneficial

## 2018-01-11 ENCOUNTER — Other Ambulatory Visit: Payer: Self-pay | Admitting: Family Medicine

## 2018-02-07 ENCOUNTER — Ambulatory Visit (HOSPITAL_COMMUNITY)
Admission: RE | Admit: 2018-02-07 | Discharge: 2018-02-07 | Disposition: A | Discharge status: Home / Self Care | Payer: BLUE CROSS/BLUE SHIELD | Source: Ambulatory Visit | Attending: Family Medicine | Admitting: Family Medicine

## 2018-02-07 DIAGNOSIS — Z1231 Encounter for screening mammogram for malignant neoplasm of breast: Secondary | ICD-10-CM | POA: Diagnosis not present

## 2018-02-26 ENCOUNTER — Ambulatory Visit (INDEPENDENT_AMBULATORY_CARE_PROVIDER_SITE_OTHER): Payer: BLUE CROSS/BLUE SHIELD | Admitting: Family Medicine

## 2018-02-26 ENCOUNTER — Encounter: Payer: Self-pay | Admitting: Family Medicine

## 2018-02-26 VITALS — BP 120/80 | HR 91 | Resp 12 | Ht 62.0 in | Wt 186.1 lb

## 2018-02-26 DIAGNOSIS — N840 Polyp of corpus uteri: Secondary | ICD-10-CM | POA: Diagnosis not present

## 2018-02-26 DIAGNOSIS — R7303 Prediabetes: Secondary | ICD-10-CM | POA: Diagnosis not present

## 2018-02-26 DIAGNOSIS — I10 Essential (primary) hypertension: Secondary | ICD-10-CM

## 2018-02-26 DIAGNOSIS — E669 Obesity, unspecified: Secondary | ICD-10-CM

## 2018-02-26 MED ORDER — POTASSIUM CHLORIDE CRYS ER 10 MEQ PO TBCR: 10.0000 meq | EXTENDED_RELEASE_TABLET | Freq: Two times a day (BID) | ORAL | 5 refills | Status: DC

## 2018-02-26 NOTE — Patient Instructions (Addendum)
Annual physical exam wiith MD June 7 or after, call if you need me sooner  CONGRATS on stopping smoking  Choose vegetable and fruit and cut out sugar  It is important that you exercise regularly at least 30 minutes 5 times a week. If you develop chest pain, have severe difficulty breathing, or feel very tired, stop exercising immediately and seek medical attention  BP is excellent  Non fasting HBa1C, cmp and EGFR this week   Please ENSURE that you get and keep an appointment with Dr Glo Herring abou the polyps in your womb

## 2018-02-27 DIAGNOSIS — R7301 Impaired fasting glucose: Secondary | ICD-10-CM | POA: Diagnosis not present

## 2018-02-27 DIAGNOSIS — I10 Essential (primary) hypertension: Secondary | ICD-10-CM | POA: Diagnosis not present

## 2018-02-28 LAB — COMPLETE METABOLIC PANEL WITH GFR
AG RATIO: 1.6 (calc) (ref 1.0–2.5)
ALKALINE PHOSPHATASE (APISO): 76 U/L (ref 33–130)
ALT: 18 U/L (ref 6–29)
AST: 14 U/L (ref 10–35)
Albumin: 4.5 g/dL (ref 3.6–5.1)
BUN: 14 mg/dL (ref 7–25)
CALCIUM: 9.9 mg/dL (ref 8.6–10.4)
CO2: 33 mmol/L — ABNORMAL HIGH (ref 20–32)
Chloride: 103 mmol/L (ref 98–110)
Creat: 0.8 mg/dL (ref 0.50–0.99)
GFR, EST NON AFRICAN AMERICAN: 80 mL/min/{1.73_m2} (ref >=60)
GFR, Est African American: 92 mL/min/{1.73_m2} (ref >=60)
Globulin: 2.8 g/dL (calc) (ref 1.9–3.7)
Glucose, Bld: 73 mg/dL (ref 65–139)
POTASSIUM: 3.8 mmol/L (ref 3.5–5.3)
Sodium: 142 mmol/L (ref 135–146)
Total Bilirubin: 0.3 mg/dL (ref 0.2–1.2)
Total Protein: 7.3 g/dL (ref 6.1–8.1)

## 2018-02-28 LAB — HEMOGLOBIN A1C
EAG (MMOL/L): 7.3 (calc)
Hgb A1c MFr Bld: 6.2 % of total Hgb — ABNORMAL HIGH (ref <5.7)
MEAN PLASMA GLUCOSE: 131 (calc)

## 2018-03-02 ENCOUNTER — Encounter: Payer: Self-pay | Admitting: Family Medicine

## 2018-03-02 NOTE — Assessment & Plan Note (Signed)
Pt to contact Gyne for biopsy recommended in the past, she did not follow through, had presented with PMB

## 2018-03-02 NOTE — Assessment & Plan Note (Signed)
Patient educated about the importance of limiting  Carbohydrate intake , the need to commit to daily physical activity for a minimum of 30 minutes , and to commit weight loss. The fact that changes in all these areas will reduce or eliminate all together the development of diabetes is stressed.  Deteriorated , needs to work on changing food choice and weight loss  Diabetic Labs Latest Ref Rng & Units 02/27/2018 08/27/2017 04/11/2015 11/13/2014 11/18/2012  HbA1c <5.7 % of total Hgb 6.2(H) 6.0(H) 6.1(H) 6.1(H) 6.0(H)  Chol <200 mg/dL - 193 - 180 182  HDL >50 mg/dL - 59 - 71 65  Calc LDL mg/dL (calc) - 117(H) - 96 91  Triglycerides <150 mg/dL - 78 - 64 128  Creatinine 0.50 - 0.99 mg/dL 0.80 0.71 0.51 0.68 0.65   BP/Weight 02/26/2018 08/29/2017 02/28/2017 01/31/2016 04/11/2015 1/00/7121 11/30/5881  Systolic BP 254 982 641 583 094 076 808  Diastolic BP 80 84 80 811 82 84 90  Wt. (Lbs) 186.12 178 172 167 168 173 173.5  BMI 34.04 32.56 31.46 30.54 30.72 31.63 31.73   No flowsheet data found.

## 2018-03-02 NOTE — Progress Notes (Signed)
Paula Best     MRN: [id number]      DOB: 06-24-1955   HPI Paula Best is here for follow up and re-evaluation of chronic medical conditions, medication management and review of any available recent lab and radiology data.  Preventive health is updated, specifically  Cancer screening and Immunization.   Refuses flu vaccine Needs to follow up with Gyne re recommendation for biopsy of endometrial polyp which had caused PMB several years ago The PT denies any adverse reactions to current medications since the last visit.  There are no new concerns.  There are no specific complaints   ROS Denies recent fever or chills. Denies sinus pressure, nasal congestion, ear pain or sore throat. Denies chest congestion, productive cough or wheezing. Denies chest pains, palpitations and leg swelling Denies abdominal pain, nausea, vomiting,diarrhea or constipation.   Denies dysuria, frequency, hesitancy or incontinence. Denies uncontrolled  joint pain, swelling and limitation in mobility. Denies headaches, seizures, numbness, or tingling. Denies depression, anxiety or insomnia. Denies skin break down or rash.   PE  BP 120/80   Pulse 91   Resp 12   Ht 5\' 2"  (1.575 m)   Wt 186 lb 1.9 oz (84.4 kg)   LMP 07/25/2011   SpO2 92% Comment: room air  BMI 34.04 kg/m   Patient alert and oriented and in no cardiopulmonary distress.  HEENT: No facial asymmetry, EOMI,   oropharynx pink and moist.  Neck supple no JVD, no mass.  Chest: Clear to auscultation bilaterally.  CVS: S1, S2 no murmurs, no S3.Regular rate.  ABD: Soft non tender.   Ext: No edema  MS: Adequate ROM spine, shoulders, hips and knees.  Skin: Intact, no ulcerations or rash noted.  Psych: Good eye contact, normal affect. Memory intact not anxious or depressed appearing.  CNS: CN 2-12 intact, power,  normal throughout.no focal deficits noted.   Assessment & Plan  Essential hypertension Controlled, no change in  medication DASH diet and commitment to daily physical activity for a minimum of 30 minutes discussed and encouraged, as a part of hypertension management. The importance of attaining a healthy weight is also discussed.  BP/Weight 02/26/2018 08/29/2017 02/28/2017 01/31/2016 04/11/2015 9/62/2297 12/01/9209  Systolic BP 941 740 814 481 856 314 970  Diastolic BP 80 84 80 263 82 84 90  Wt. (Lbs) 186.12 178 172 167 168 173 173.5  BMI 34.04 32.56 31.46 30.54 30.72 31.63 31.73       Obesity (BMI 30.0-34.9) Deteriorated. Patient re-educated about  the importance of commitment to a  minimum of 150 minutes of exercise per week.  The importance of healthy food choices with portion control discussed. Encouraged to start a food diary, count calories and to consider  joining a support group. Sample diet sheets offered. Goals set by the patient for the next several months.   Weight /BMI 02/26/2018 08/29/2017 02/28/2017  WEIGHT 186 lb 1.9 oz 178 lb 172 lb  HEIGHT 5\' 2"  5\' 2"  5\' 2"   BMI 34.04 kg/m2 32.56 kg/m2 31.46 kg/m2      IFG (impaired fasting glucose) Patient educated about the importance of limiting  Carbohydrate intake , the need to commit to daily physical activity for a minimum of 30 minutes , and to commit weight loss. The fact that changes in all these areas will reduce or eliminate all together the development of diabetes is stressed.  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 (Lbs) 186.12 178 172 167 168 173 173.5  BMI 34.04 32.56 31.46 30.54 30.72 31.63 31.73   No flowsheet data found.    Endometrial polyp Pt to contact Gyne for biopsy recommended in the past, she did not follow through, had presented with PMB

## 2018-03-02 NOTE — Assessment & Plan Note (Signed)
Deteriorated. Patient re-educated about  the importance of commitment to a  minimum of 150 minutes of exercise per week.  The importance of healthy food choices with portion control discussed. Encouraged to start a food diary, count calories and to consider  joining a support group. Sample diet sheets offered. Goals set by the patient for the next several months.   Weight /BMI 02/26/2018 08/29/2017 02/28/2017  WEIGHT 186 lb 1.9 oz 178 lb 172 lb  HEIGHT 5\' 2"  5\' 2"  5\' 2"   BMI 34.04 kg/m2 32.56 kg/m2 31.46 kg/m2

## 2018-03-02 NOTE — Assessment & Plan Note (Signed)
Controlled, no change in medication DASH diet and commitment to daily physical activity for a minimum of 30 minutes discussed and encouraged, as a part of hypertension management. The importance of attaining a healthy weight is also discussed.  BP/Weight 02/26/2018 08/29/2017 02/28/2017 01/31/2016 04/11/2015 5/78/4696 05/04/5282  Systolic BP 132 440 102 725 366 440 347  Diastolic BP 80 84 80 425 82 84 90  Wt. (Lbs) 186.12 178 172 167 168 173 173.5  BMI 34.04 32.56 31.46 30.54 30.72 31.63 31.73

## 2018-03-03 ENCOUNTER — Encounter: Payer: Self-pay | Admitting: *Deleted

## 2018-03-03 ENCOUNTER — Encounter: Payer: Self-pay | Admitting: Family Medicine

## 2018-08-06 ENCOUNTER — Other Ambulatory Visit: Payer: Self-pay | Admitting: Family Medicine

## 2018-09-10 ENCOUNTER — Encounter (INDEPENDENT_AMBULATORY_CARE_PROVIDER_SITE_OTHER): Payer: Self-pay

## 2018-09-10 ENCOUNTER — Ambulatory Visit (INDEPENDENT_AMBULATORY_CARE_PROVIDER_SITE_OTHER): Payer: BC Managed Care – PPO | Admitting: Family Medicine

## 2018-09-10 ENCOUNTER — Other Ambulatory Visit: Payer: Self-pay

## 2018-09-10 ENCOUNTER — Encounter: Payer: Self-pay | Admitting: Family Medicine

## 2018-09-10 VITALS — BP 152/92 | HR 88 | Resp 12 | Ht 62.0 in | Wt 194.0 lb

## 2018-09-10 DIAGNOSIS — R7301 Impaired fasting glucose: Secondary | ICD-10-CM

## 2018-09-10 DIAGNOSIS — I10 Essential (primary) hypertension: Secondary | ICD-10-CM

## 2018-09-10 DIAGNOSIS — E559 Vitamin D deficiency, unspecified: Secondary | ICD-10-CM

## 2018-09-10 DIAGNOSIS — D509 Iron deficiency anemia, unspecified: Secondary | ICD-10-CM | POA: Diagnosis not present

## 2018-09-10 DIAGNOSIS — H547 Unspecified visual loss: Secondary | ICD-10-CM

## 2018-09-10 DIAGNOSIS — Z0000 Encounter for general adult medical examination without abnormal findings: Secondary | ICD-10-CM | POA: Diagnosis not present

## 2018-09-10 DIAGNOSIS — E669 Obesity, unspecified: Secondary | ICD-10-CM

## 2018-09-10 DIAGNOSIS — Z1211 Encounter for screening for malignant neoplasm of colon: Secondary | ICD-10-CM

## 2018-09-10 DIAGNOSIS — Z1231 Encounter for screening mammogram for malignant neoplasm of breast: Secondary | ICD-10-CM

## 2018-09-10 NOTE — Patient Instructions (Signed)
F/U in 4 months, call if you need me sooner  You are being referred for eye exam, and for colonoscopy  Mammogram will be scheduled for November  Need 10 pound weight loss, change food choice and eat less  Please remember to take meds same time every day  Please get fasting CBC, lipid, cmp and EGFr, hBa1C , TSH and vit D this week at [facility]  It is important that you exercise regularly at least 30 minutes 5 times a week. If you develop chest pain, have severe difficulty breathing, or feel very tired, stop exercising immediately and seek medical attention   Think about what you will eat, plan ahead. Choose " clean, green, fresh or frozen" over canned, processed or packaged foods which are more sugary, salty and fatty. 70 to 75% of food eaten should be vegetables and fruit. Three meals at set times with snacks allowed between meals, but they must be fruit or vegetables. Aim to eat over a 12 hour period , example 7 am to 7 pm, and STOP after  your last meal of the day. Drink water,generally about 64 ounces per day, no other drink is as healthy. Fruit juice is best enjoyed in a healthy way, by EATING the fruit.  Thanks for choosing [facility], we consider it a privelige to serve you.

## 2018-09-10 NOTE — Progress Notes (Signed)
    Paula Best     MRN: [id number]      DOB: 27-Sep-1955  HPI: Patient is in for annual physical exam. No other health concerns are expressed or addressed at the visit.  Immunization is reviewed , and  updated if needed.   PE: BP (!) 152/92   Pulse 88   Resp 12   Ht 5\' 2"  (1.575 m)   Wt 194 lb (88 kg)   LMP 07/25/2011   SpO2 97%   BMI 35.48 kg/m   Pleasant  female, alert and oriented x 3, in no cardio-pulmonary distress. Afebrile. HEENT No facial trauma or asymetry. Sinuses non tender.  Extra occullar muscles intact, External ears normal, tympanic membranes clear. Oropharynx moist, no exudate. Neck: supple, no adenopathy,JVD or thyromegaly.No bruits.  Chest: Clear to ascultation bilaterally.No crackles or wheezes. Non tender to palpation    Cardiovascular system; Heart sounds normal,  S1 and  S2 ,no S3.  No murmur, or thrill. Apical beat not displaced Peripheral pulses normal.  Abdomen: Soft, non tender, no organomegaly or masses.  Bowel sounds normal. No guarding, tenderness or rebound.   Musculoskeletal exam: Full ROM of spine, hips , shoulders and knees. No deformity ,swelling or crepitus noted. No muscle wasting or atrophy.   Neurologic: Cranial nerves 2 to 12 intact. Power, tone ,sensation and reflexes normal throughout. No disturbance in gait. No tremor.  Skin: Intact, no ulceration, erythema , scaling or rash noted. Pigmentation normal throughout  Psych; Normal mood and affect. Judgement and concentration normal   Assessment & Plan:  Annual physical exam Annual exam as documented. Counseling done  re healthy lifestyle involving commitment to 150 minutes exercise per week, heart healthy diet, and attaining healthy weight.The importance of adequate sleep also discussed.  Changes in health habits are decided on by the patient with goals and time frames  set for achieving them. Immunization and cancer screening needs are specifically  addressed at this visit.   Obesity (BMI 30.0-34.9)  Patient re-educated about  the importance of commitment to a  minimum of 150 minutes of exercise per week as able.  The importance of healthy food choices with portion control discussed, as well as eating regularly and within a 12 hour window most days. The need to choose "clean , green" food 50 to 75% of the time is discussed, as well as to make water the primary drink and set a goal of 64 ounces water daily.    Weight /BMI 09/10/2018 02/26/2018 08/29/2017  WEIGHT 194 lb 186 lb 1.9 oz 178 lb  HEIGHT 5\' 2"  5\' 2"  5\' 2"   BMI 35.48 kg/m2 34.04 kg/m2 32.56 kg/m2      Reduced vision Reduced vision in each eye, refer for eye exam

## 2018-09-14 ENCOUNTER — Encounter: Payer: Self-pay | Admitting: Family Medicine

## 2018-09-14 DIAGNOSIS — H547 Unspecified visual loss: Secondary | ICD-10-CM | POA: Insufficient documentation

## 2018-09-14 NOTE — Assessment & Plan Note (Signed)
  Patient re-educated about  the importance of commitment to a  minimum of 150 minutes of exercise per week as able.  The importance of healthy food choices with portion control discussed, as well as eating regularly and within a 12 hour window most days. The need to choose "clean , green" food 50 to 75% of the time is discussed, as well as to make water the primary drink and set a goal of 64 ounces water daily.    Weight /BMI 09/10/2018 02/26/2018 08/29/2017  WEIGHT 194 lb 186 lb 1.9 oz 178 lb  HEIGHT 5\' 2"  5\' 2"  5\' 2"   BMI 35.48 kg/m2 34.04 kg/m2 32.56 kg/m2

## 2018-09-14 NOTE — Assessment & Plan Note (Signed)
Reduced vision in each eye, refer for eye exam

## 2018-09-14 NOTE — Assessment & Plan Note (Signed)
Annual exam as documented. Counseling done  re healthy lifestyle involving commitment to 150 minutes exercise per week, heart healthy diet, and attaining healthy weight.The importance of adequate sleep also discussed. Changes in health habits are decided on by the patient with goals and time frames  set for achieving them. Immunization and cancer screening needs are specifically addressed at this visit. 

## 2018-09-30 DIAGNOSIS — M79671 Pain in right foot: Secondary | ICD-10-CM | POA: Diagnosis not present

## 2018-09-30 DIAGNOSIS — M722 Plantar fascial fibromatosis: Secondary | ICD-10-CM | POA: Diagnosis not present

## 2018-11-20 DIAGNOSIS — H25813 Combined forms of age-related cataract, bilateral: Secondary | ICD-10-CM | POA: Diagnosis not present

## 2018-11-24 ENCOUNTER — Other Ambulatory Visit: Payer: Self-pay | Admitting: Family Medicine

## 2019-01-14 ENCOUNTER — Encounter (INDEPENDENT_AMBULATORY_CARE_PROVIDER_SITE_OTHER): Payer: Self-pay

## 2019-01-14 ENCOUNTER — Other Ambulatory Visit: Payer: Self-pay

## 2019-01-14 ENCOUNTER — Ambulatory Visit (INDEPENDENT_AMBULATORY_CARE_PROVIDER_SITE_OTHER): Payer: BC Managed Care – PPO | Admitting: Family Medicine

## 2019-01-14 ENCOUNTER — Encounter: Payer: Self-pay | Admitting: Family Medicine

## 2019-01-14 VITALS — BP 128/80 | Temp 97.7°F | Ht 62.0 in | Wt 188.0 lb

## 2019-01-14 DIAGNOSIS — E66811 Obesity, class 1: Secondary | ICD-10-CM

## 2019-01-14 DIAGNOSIS — R7301 Impaired fasting glucose: Secondary | ICD-10-CM

## 2019-01-14 DIAGNOSIS — I10 Essential (primary) hypertension: Secondary | ICD-10-CM

## 2019-01-14 DIAGNOSIS — Z1211 Encounter for screening for malignant neoplasm of colon: Secondary | ICD-10-CM

## 2019-01-14 DIAGNOSIS — J4520 Mild intermittent asthma, uncomplicated: Secondary | ICD-10-CM | POA: Diagnosis not present

## 2019-01-14 DIAGNOSIS — E669 Obesity, unspecified: Secondary | ICD-10-CM | POA: Diagnosis not present

## 2019-01-14 MED ORDER — HYDROCHLOROTHIAZIDE 25 MG PO TABS: ORAL_TABLET | ORAL | 1 refills | Status: DC

## 2019-01-14 MED ORDER — AMLODIPINE BESYLATE 10 MG PO TABS: ORAL_TABLET | ORAL | 1 refills | Status: DC

## 2019-01-14 NOTE — Patient Instructions (Addendum)
F/u in end March, call if you need me before  PLEASE reconsider flu vaccine , available every Monday  Please get labs this week, then paper work will be completed next week,  Congrats on weight loss, 12 pounds in 5 months is the goal  It is important that you exercise regularly at least 30 minutes 5 times a week. If you develop chest pain, have severe difficulty breathing, or feel very tired, stop exercising immediately and seek medical attention   Thanks for choosing  [facility], we consider it a privelige to serve you.   You are referred for your colonoscopy  Thanks for choosing [facility] we consider it a privelige to serve you.

## 2019-01-14 NOTE — Progress Notes (Signed)
Paula Best     MRN: [id number]      DOB: 1956-01-18   HPI Paula Best is here for follow up and re-evaluation of chronic medical conditions, medication management and review of any available recent lab and radiology data.  Preventive health is updated, specifically  Cancer screening and Immunization.  refuses flu vaccine Questions or concerns regarding consultations or procedures which the PT has had in the interim are  addressed. The PT denies any adverse reactions to current medications since the last visit.  There are no new concerns.  There are no specific complaints   ROS Denies recent fever or chills. Denies sinus pressure, nasal congestion, ear pain or sore throat. Denies chest congestion, productive cough or wheezing. Denies chest pains, palpitations and leg swelling Denies abdominal pain, nausea, vomiting,diarrhea or constipation.   Denies dysuria, frequency, hesitancy or incontinence. Denies joint pain, swelling and limitation in mobility. Denies headaches, seizures, numbness, or tingling. Denies depression, anxiety or insomnia. Denies skin break down or rash.   PE  BP 128/80   Temp 97.7 F (36.5 C) (Temporal)   Ht 5\' 2"  (1.575 m)   Wt 188 lb (85.3 kg)   LMP 07/25/2011   BMI 34.39 kg/m    Patient alert and oriented and in no cardiopulmonary distress.  HEENT: No facial asymmetry, EOMI,     Neck supple .  Chest: Clear to auscultation bilaterally.  CVS: S1, S2 no murmurs, no S3.Regular rate.  ABD: Soft non tender.   Ext: No edema  MS: Adequate ROM spine, shoulders, hips and knees.  Skin: Intact, no ulcerations or rash noted.  Psych: Good eye contact, normal affect. Memory intact not anxious or depressed appearing.  CNS: CN 2-12 intact, power,  normal throughout.no focal deficits noted.   Assessment & Plan  Essential hypertension Controlled, no change in medication DASH diet and commitment to daily physical activity for a minimum of 30  minutes discussed and encouraged, as a part of hypertension management. The importance of attaining a healthy weight is also discussed.  BP/Weight 01/14/2019 09/10/2018 02/26/2018 08/29/2017 02/28/2017 01/31/2016 [id number]  Systolic BP 0000000 0000000 123456 0000000 [id number]  Diastolic BP 80 92 80 84 80 110 82  Wt. (Lbs) 188 194 186.12 178 172 167 168  BMI 34.39 35.48 34.04 32.56 31.46 30.54 30.72       Asthma, mild intermittent No recent flare, importance of vaccines discussed , still resists  Obesity (BMI 30.0-34.9)  Patient re-educated about  the importance of commitment to a  minimum of 150 minutes of exercise per week as able.  The importance of healthy food choices with portion control discussed, as well as eating regularly and within a 12 hour window most days. The need to choose "clean , green" food 50 to 75% of the time is discussed, as well as to make water the primary drink and set a goal of 64 ounces water daily.    Weight /BMI 01/14/2019 09/10/2018 02/26/2018  WEIGHT 188 lb 194 lb 186 lb 1.9 oz  HEIGHT 5\' 2"  5\' 2"  5\' 2"   BMI 34.39 kg/m2 35.48 kg/m2 34.04 kg/m2      IFG (impaired fasting glucose) Patient educated about the importance of limiting  Carbohydrate intake , the need to commit to daily physical activity for a minimum of 30 minutes , and to commit weight loss. The fact that changes in all these areas will reduce or eliminate all together the development of diabetes is stressed.   Diabetic  Labs Latest Ref Rng & Units 02/27/2018 08/27/2017 04/11/2015 11/13/2014 11/18/2012  HbA1c <5.7 % of total Hgb 6.2(H) 6.0(H) 6.1(H) 6.1(H) 6.0(H)  Chol <200 mg/dL - 193 - 180 182  HDL >50 mg/dL - 59 - 71 65  Calc LDL mg/dL (calc) - 117(H) - 96 91  Triglycerides <150 mg/dL - 78 - 64 128  Creatinine 0.50 - 0.99 mg/dL 0.80 0.71 0.51 0.68 0.65   BP/Weight 01/14/2019 09/10/2018 02/26/2018 08/29/2017 02/28/2017 01/31/2016 [id number]  Systolic BP 0000000 0000000 123456 0000000 [id number]  Diastolic BP 80 92 80 84 80 110  82  Wt. (Lbs) 188 194 186.12 178 172 167 168  BMI 34.39 35.48 34.04 32.56 31.46 30.54 30.72   No flowsheet data found.  Updated lab needed at/ before next visit.

## 2019-01-15 ENCOUNTER — Encounter: Payer: Self-pay | Admitting: Family Medicine

## 2019-01-15 ENCOUNTER — Encounter (INDEPENDENT_AMBULATORY_CARE_PROVIDER_SITE_OTHER): Payer: Self-pay | Admitting: *Deleted

## 2019-01-15 NOTE — Assessment & Plan Note (Signed)
No recent flare, importance of vaccines discussed , still resists

## 2019-01-15 NOTE — Assessment & Plan Note (Signed)
Patient educated about the importance of limiting  Carbohydrate intake , the need to commit to daily physical activity for a minimum of 30 minutes , and to commit weight loss. The fact that changes in all these areas will reduce or eliminate all together the development of diabetes is stressed.   Diabetic Labs Latest Ref Rng & Units 02/27/2018 08/27/2017 04/11/2015 11/13/2014 11/18/2012  HbA1c <5.7 % of total Hgb 6.2(H) 6.0(H) 6.1(H) 6.1(H) 6.0(H)  Chol <200 mg/dL - 193 - 180 182  HDL >50 mg/dL - 59 - 71 65  Calc LDL mg/dL (calc) - 117(H) - 96 91  Triglycerides <150 mg/dL - 78 - 64 128  Creatinine 0.50 - 0.99 mg/dL 0.80 0.71 0.51 0.68 0.65   BP/Weight 01/14/2019 09/10/2018 02/26/2018 08/29/2017 02/28/2017 01/31/2016 [id number]  Systolic BP 0000000 0000000 123456 0000000 123XX123 99991111 [id number]  Diastolic BP 80 92 80 84 80 110 82  Wt. (Lbs) 188 194 186.12 178 172 167 168  BMI 34.39 35.48 34.04 32.56 31.46 30.54 30.72   No flowsheet data found.  Updated lab needed at/ before next visit.

## 2019-01-15 NOTE — Assessment & Plan Note (Signed)
  Patient re-educated about  the importance of commitment to a  minimum of 150 minutes of exercise per week as able.  The importance of healthy food choices with portion control discussed, as well as eating regularly and within a 12 hour window most days. The need to choose "clean , green" food 50 to 75% of the time is discussed, as well as to make water the primary drink and set a goal of 64 ounces water daily.    Weight /BMI 01/14/2019 09/10/2018 02/26/2018  WEIGHT 188 lb 194 lb 186 lb 1.9 oz  HEIGHT 5\' 2"  5\' 2"  5\' 2"   BMI 34.39 kg/m2 35.48 kg/m2 34.04 kg/m2

## 2019-01-15 NOTE — Assessment & Plan Note (Signed)
Controlled, no change in medication DASH diet and commitment to daily physical activity for a minimum of 30 minutes discussed and encouraged, as a part of hypertension management. The importance of attaining a healthy weight is also discussed.  BP/Weight 01/14/2019 09/10/2018 02/26/2018 08/29/2017 02/28/2017 01/31/2016 [id number]  Systolic BP 0000000 0000000 123456 0000000 123XX123 99991111 [id number]  Diastolic BP 80 92 80 84 80 110 82  Wt. (Lbs) 188 194 186.12 178 172 167 168  BMI 34.39 35.48 34.04 32.56 31.46 30.54 30.72

## 2019-01-16 DIAGNOSIS — E559 Vitamin D deficiency, unspecified: Secondary | ICD-10-CM | POA: Diagnosis not present

## 2019-01-16 DIAGNOSIS — I10 Essential (primary) hypertension: Secondary | ICD-10-CM | POA: Diagnosis not present

## 2019-01-16 DIAGNOSIS — D509 Iron deficiency anemia, unspecified: Secondary | ICD-10-CM | POA: Diagnosis not present

## 2019-01-16 DIAGNOSIS — R7301 Impaired fasting glucose: Secondary | ICD-10-CM | POA: Diagnosis not present

## 2019-01-17 LAB — COMPLETE METABOLIC PANEL WITH GFR
AG Ratio: 1.5 (calc) (ref 1.0–2.5)
ALT: 34 U/L — ABNORMAL HIGH (ref 6–29)
AST: 22 U/L (ref 10–35)
Albumin: 4.2 g/dL (ref 3.6–5.1)
Alkaline phosphatase (APISO): 56 U/L (ref 37–153)
BUN: 17 mg/dL (ref 7–25)
CO2: 30 mmol/L (ref 20–32)
Calcium: 9.6 mg/dL (ref 8.6–10.4)
Chloride: 103 mmol/L (ref 98–110)
Creat: 0.62 mg/dL (ref 0.50–0.99)
GFR, Est African American: 112 mL/min/{1.73_m2} (ref >=60)
GFR, Est Non African American: 97 mL/min/{1.73_m2} (ref >=60)
Globulin: 2.8 g/dL (calc) (ref 1.9–3.7)
Glucose, Bld: 105 mg/dL — ABNORMAL HIGH (ref 65–99)
Potassium: 3.5 mmol/L (ref 3.5–5.3)
Sodium: 141 mmol/L (ref 135–146)
Total Bilirubin: 0.4 mg/dL (ref 0.2–1.2)
Total Protein: 7 g/dL (ref 6.1–8.1)

## 2019-01-17 LAB — LIPID PANEL
Cholesterol: 197 mg/dL (ref <200)
HDL: 64 mg/dL (ref >=50)
LDL Cholesterol (Calc): 117 mg/dL (calc) — ABNORMAL HIGH
Non-HDL Cholesterol (Calc): 133 mg/dL (calc) — ABNORMAL HIGH (ref <130)
Total CHOL/HDL Ratio: 3.1 (calc) (ref <5.0)
Triglycerides: 66 mg/dL (ref <150)

## 2019-01-17 LAB — CBC
HCT: 42.3 % (ref 35.0–45.0)
Hemoglobin: 14.2 g/dL (ref 11.7–15.5)
MCH: 29.6 pg (ref 27.0–33.0)
MCHC: 33.6 g/dL (ref 32.0–36.0)
MCV: 88.3 fL (ref 80.0–100.0)
MPV: 9.4 fL (ref 7.5–12.5)
Platelets: 312 10*3/uL (ref 140–400)
RBC: 4.79 10*6/uL (ref 3.80–5.10)
RDW: 13.2 % (ref 11.0–15.0)
WBC: 7.4 10*3/uL (ref 3.8–10.8)

## 2019-01-17 LAB — HEMOGLOBIN A1C
Hgb A1c MFr Bld: 6.5 % of total Hgb — ABNORMAL HIGH (ref <5.7)
Mean Plasma Glucose: 140 (calc)
eAG (mmol/L): 7.7 (calc)

## 2019-01-17 LAB — TSH: TSH: 1.08 mIU/L (ref 0.40–4.50)

## 2019-01-17 LAB — VITAMIN D 25 HYDROXY (VIT D DEFICIENCY, FRACTURES): Vit D, 25-Hydroxy: 32 ng/mL (ref 30–100)

## 2019-01-19 NOTE — Progress Notes (Signed)
Results mailed 

## 2019-01-23 ENCOUNTER — Telehealth: Payer: Self-pay | Admitting: *Deleted

## 2019-01-23 NOTE — Telephone Encounter (Signed)
Left generic message to call back to discuss recent lab results. ° °

## 2019-01-23 NOTE — Telephone Encounter (Signed)
Result notes have  been entered in the patient's record, please review them with her. Pls ask if you have questions locating the notes

## 2019-01-23 NOTE — Telephone Encounter (Signed)
Pt was just wondering about her blood work results

## 2019-01-26 NOTE — Telephone Encounter (Signed)
Called patient to give lab results. No answer. Left generic message to call back for results.

## 2019-01-26 NOTE — Telephone Encounter (Signed)
Called patient to discuss lab results. No answer. Left generic message requesting call back.

## 2019-01-27 NOTE — Telephone Encounter (Signed)
Letter mailed

## 2019-02-13 ENCOUNTER — Ambulatory Visit (HOSPITAL_COMMUNITY): Payer: BC Managed Care – PPO

## 2019-02-16 ENCOUNTER — Other Ambulatory Visit: Payer: Self-pay

## 2019-02-16 ENCOUNTER — Ambulatory Visit (HOSPITAL_COMMUNITY)
Admission: RE | Admit: 2019-02-16 | Discharge: 2019-02-16 | Disposition: A | Discharge status: Home / Self Care | Payer: BC Managed Care – PPO | Source: Ambulatory Visit | Attending: Family Medicine | Admitting: Family Medicine

## 2019-02-16 DIAGNOSIS — Z1231 Encounter for screening mammogram for malignant neoplasm of breast: Secondary | ICD-10-CM | POA: Diagnosis not present

## 2019-06-08 ENCOUNTER — Encounter: Payer: Self-pay | Admitting: Gastroenterology

## 2019-06-23 ENCOUNTER — Other Ambulatory Visit: Payer: Self-pay

## 2019-06-23 ENCOUNTER — Ambulatory Visit (INDEPENDENT_AMBULATORY_CARE_PROVIDER_SITE_OTHER): Payer: BC Managed Care – PPO | Admitting: Family Medicine

## 2019-06-23 ENCOUNTER — Encounter: Payer: Self-pay | Admitting: Family Medicine

## 2019-06-23 VITALS — BP 140/84 | HR 80 | Temp 97.5°F | Resp 15 | Ht 62.0 in | Wt 187.0 lb

## 2019-06-23 DIAGNOSIS — M25561 Pain in right knee: Secondary | ICD-10-CM | POA: Diagnosis not present

## 2019-06-23 DIAGNOSIS — R7301 Impaired fasting glucose: Secondary | ICD-10-CM

## 2019-06-23 DIAGNOSIS — E785 Hyperlipidemia, unspecified: Secondary | ICD-10-CM | POA: Diagnosis not present

## 2019-06-23 DIAGNOSIS — I10 Essential (primary) hypertension: Secondary | ICD-10-CM | POA: Diagnosis not present

## 2019-06-23 MED ORDER — NAPROXEN SODIUM 275 MG PO TABS: 275.0000 mg | ORAL_TABLET | Freq: Two times a day (BID) | ORAL | 0 refills | Status: AC

## 2019-06-23 NOTE — Patient Instructions (Signed)
I appreciate the opportunity to provide you with care for your health and wellness. Today we discussed: knee pain  Follow up: 10 days   Labs fasting this week  No referrals at this time, might need one if knee pain does not get better.  Please continue to practice social distancing to keep you, your family, and our community safe.  If you must go out, please wear a mask and practice good handwashing.  It was a pleasure to see you and I look forward to continuing to work together on your health and well-being. Please do not hesitate to call the office if you need care or have questions about your care.  Have a wonderful day and week. With Gratitude, Cherly Beach, DNP, AGNP-BC

## 2019-06-23 NOTE — Progress Notes (Signed)
Subjective:  Patient ID: Paula Best, female    DOB: Apr 04, 1955  Age: 64 y.o. MRN: [id number]  CC:  Chief Complaint  Patient presents with  . Knee Pain    righjt knee pain x 3 weeks       HPI  HPI Paula Best is a 64 year old female patient of Dr. Griffin Dakin.  Who presents today with discomfort in her right knee. Reports that she has had left knee pain in the past.  But recently last 3 weeks she has had right knee pain.  Was diagnosed with osteoarthritis previously.  Right knee started hurting 3 weeks ago duration off and on sometimes several hours at a time.  Predominantly hurts the most when she is sitting for long periods of time or getting up first thing in the morning.  She describes it as stiff and at times a little bit of throbbing discomfort and some swelling-like sensation.  It is alleviated when she rested and elevates it.  Or when she starts moving throughout her day.  Worse in the morning.  Pain scale 8 out of 10-when it is hurting but reports that it is not having any trouble right now..   Today patient denies signs and symptoms of COVID 19 infection including fever, chills, cough, shortness of breath, and headache. Past Medical, Surgical, Social History, Allergies, and Medications have been Reviewed.   Past Medical History:  Diagnosis Date  . Anemia, iron deficiency   . Asthma   . Heart murmur, systolic   . Hypertension   . Lipoma   . LVH (left ventricular hypertrophy)    mild- Echo 3/11  . Menorrhagia   . Obesity   . Tobacco user   . Vitamin D deficiency     Current Meds  Medication Sig  . amLODipine (NORVASC) 10 MG tablet TAKE 1 TABLET(10 MG) BY MOUTH DAILY  . hydrochlorothiazide (HYDRODIURIL) 25 MG tablet TAKE 1 TABLET(25 MG) BY MOUTH DAILY    ROS:  Review of Systems  Musculoskeletal: Positive for joint pain.  All other systems reviewed and are negative.    Objective:   Today's Vitals: BP 140/84   Pulse 80   Temp (!) 97.5 F (36.4  C) (Temporal)   Resp 15   Ht 5\' 2"  (1.575 m)   Wt 187 lb (84.8 kg)   LMP 07/25/2011   SpO2 97%   BMI 34.20 kg/m  Vitals with BMI 06/23/2019 01/14/2019 01/14/2019  Height 5\' 2"  5\' 2"  5\' 2"   Weight 187 lbs - 188 lbs  BMI 99991111 - 123456  Systolic XX123456 0000000 XX123456  Diastolic 84 80 82  Pulse 80 - -     Physical Exam Vitals and nursing note reviewed.  Constitutional:      Appearance: Normal appearance. She is well-developed, well-groomed and overweight.  HENT:     Head: Normocephalic and atraumatic.     Right Ear: External ear normal.     Left Ear: External ear normal.     Mouth/Throat:     Comments: Mask in place Eyes:     General:        Right eye: No discharge.        Left eye: No discharge.     Conjunctiva/sclera: Conjunctivae normal.  Cardiovascular:     Rate and Rhythm: Normal rate and regular rhythm.     Pulses: Normal pulses.     Heart sounds: Normal heart sounds.  Pulmonary:     Effort: Pulmonary effort is normal.  Breath sounds: Normal breath sounds.  Musculoskeletal:     Cervical back: Normal range of motion and neck supple.     Right knee: Normal. Normal range of motion. No tenderness.     Left knee: Normal. Normal range of motion. No tenderness.  Skin:    General: Skin is warm.  Neurological:     General: No focal deficit present.     Mental Status: She is alert and oriented to person, place, and time.  Psychiatric:        Mood and Affect: Mood normal.        Behavior: Behavior normal.        Thought Content: Thought content normal.        Judgment: Judgment normal.    Assessment   1. Acute pain of right knee   2. Essential hypertension   3. IFG (impaired fasting glucose)   4. Hyperlipidemia, unspecified hyperlipidemia type     Tests ordered Orders Placed This Encounter  Procedures  . CBC  . COMPLETE METABOLIC PANEL WITH GFR  . Hemoglobin A1c  . Lipid panel     Plan: Please see assessment and plan per problem list above.   Meds ordered  this encounter  Medications  . naproxen sodium (ANAPROX) 275 MG tablet    Sig: Take 1 tablet (275 mg total) by mouth 2 (two) times daily with a meal for 7 days.    Dispense:  14 tablet    Refill:  0    Order Specific Question:   Supervising Provider    Answer:   Fayrene Helper [id number]    Patient to follow-up in 07/03/2019.  Perlie Mayo, NP

## 2019-06-24 DIAGNOSIS — E785 Hyperlipidemia, unspecified: Secondary | ICD-10-CM | POA: Insufficient documentation

## 2019-06-24 DIAGNOSIS — M25561 Pain in right knee: Secondary | ICD-10-CM | POA: Insufficient documentation

## 2019-06-24 NOTE — Assessment & Plan Note (Signed)
Getting updated lab, encourage low-fat heart healthy diet.

## 2019-06-24 NOTE — Assessment & Plan Note (Addendum)
Ordered naproxen sodium for pain and discomfort. Given findings and description seems to follow the pattern of having arthritis.  Might need Ortho referral for possible injections in future.  Reviewed side effects, risks and benefits of medication.   Patient acknowledged agreement and understanding of the plan.

## 2019-06-24 NOTE — Assessment & Plan Note (Signed)
Paula Best is encouraged to maintain a well balanced diet that is low in salt. Not controlled, unsure if discomfort or pain related.  Continue current medication regimen.. Close follow up Additionally, she is also reminded that exercise is beneficial for heart health and control of  Blood pressure. 30-60 minutes daily is recommended-walking was suggested.

## 2019-06-26 DIAGNOSIS — E785 Hyperlipidemia, unspecified: Secondary | ICD-10-CM | POA: Diagnosis not present

## 2019-06-26 DIAGNOSIS — R7301 Impaired fasting glucose: Secondary | ICD-10-CM | POA: Diagnosis not present

## 2019-06-26 DIAGNOSIS — I10 Essential (primary) hypertension: Secondary | ICD-10-CM | POA: Diagnosis not present

## 2019-06-27 LAB — CBC
HCT: 42.7 % (ref 35.0–45.0)
Hemoglobin: 14.5 g/dL (ref 11.7–15.5)
MCH: 29.8 pg (ref 27.0–33.0)
MCHC: 34 g/dL (ref 32.0–36.0)
MCV: 87.9 fL (ref 80.0–100.0)
MPV: 9.3 fL (ref 7.5–12.5)
Platelets: 327 10*3/uL (ref 140–400)
RBC: 4.86 10*6/uL (ref 3.80–5.10)
RDW: 13.1 % (ref 11.0–15.0)
WBC: 7.6 10*3/uL (ref 3.8–10.8)

## 2019-06-27 LAB — COMPLETE METABOLIC PANEL WITH GFR
AG Ratio: 1.6 (calc) (ref 1.0–2.5)
ALT: 28 U/L (ref 6–29)
AST: 22 U/L (ref 10–35)
Albumin: 4.2 g/dL (ref 3.6–5.1)
Alkaline phosphatase (APISO): 59 U/L (ref 37–153)
BUN: 18 mg/dL (ref 7–25)
CO2: 29 mmol/L (ref 20–32)
Calcium: 9.7 mg/dL (ref 8.6–10.4)
Chloride: 104 mmol/L (ref 98–110)
Creat: 0.7 mg/dL (ref 0.50–0.99)
GFR, Est African American: 107 mL/min/{1.73_m2} (ref >=60)
GFR, Est Non African American: 92 mL/min/{1.73_m2} (ref >=60)
Globulin: 2.6 g/dL (calc) (ref 1.9–3.7)
Glucose, Bld: 98 mg/dL (ref 65–99)
Potassium: 3.7 mmol/L (ref 3.5–5.3)
Sodium: 142 mmol/L (ref 135–146)
Total Bilirubin: 0.4 mg/dL (ref 0.2–1.2)
Total Protein: 6.8 g/dL (ref 6.1–8.1)

## 2019-06-27 LAB — HEMOGLOBIN A1C
Hgb A1c MFr Bld: 6.4 % of total Hgb — ABNORMAL HIGH (ref <5.7)
Mean Plasma Glucose: 137 (calc)
eAG (mmol/L): 7.6 (calc)

## 2019-06-27 LAB — LIPID PANEL
Cholesterol: 201 mg/dL — ABNORMAL HIGH (ref <200)
HDL: 66 mg/dL (ref >=50)
LDL Cholesterol (Calc): 118 mg/dL (calc) — ABNORMAL HIGH
Non-HDL Cholesterol (Calc): 135 mg/dL (calc) — ABNORMAL HIGH (ref <130)
Total CHOL/HDL Ratio: 3 (calc) (ref <5.0)
Triglycerides: 74 mg/dL (ref <150)

## 2019-07-03 ENCOUNTER — Ambulatory Visit: Payer: BC Managed Care – PPO | Admitting: Family Medicine

## 2019-07-24 ENCOUNTER — Ambulatory Visit
Admission: EM | Admit: 2019-07-24 | Discharge: 2019-07-24 | Disposition: A | Discharge status: Home / Self Care | Payer: BC Managed Care – PPO | Attending: Emergency Medicine | Admitting: Emergency Medicine

## 2019-07-24 DIAGNOSIS — L304 Erythema intertrigo: Secondary | ICD-10-CM

## 2019-07-24 DIAGNOSIS — M25562 Pain in left knee: Secondary | ICD-10-CM

## 2019-07-24 DIAGNOSIS — M25561 Pain in right knee: Secondary | ICD-10-CM | POA: Diagnosis not present

## 2019-07-24 DIAGNOSIS — B379 Candidiasis, unspecified: Secondary | ICD-10-CM

## 2019-07-24 DIAGNOSIS — G8929 Other chronic pain: Secondary | ICD-10-CM

## 2019-07-24 MED ORDER — PREDNISONE 20 MG PO TABS: 20.0000 mg | ORAL_TABLET | Freq: Two times a day (BID) | ORAL | 0 refills | Status: AC

## 2019-07-24 MED ORDER — CLOTRIMAZOLE 1 % EX CREA: TOPICAL_CREAM | CUTANEOUS | 0 refills | Status: DC

## 2019-07-24 NOTE — ED Triage Notes (Signed)
Pt presents with complaints of knee pain bilaterally, the right is the worse. Denies any injury. Reports this has been going on for a long time.  Pt also complains of a rash under neath her breast that started yesterday. The are is red.

## 2019-07-24 NOTE — ED Provider Notes (Signed)
[facility]   [id number] 07/24/19 Arrival Time: [id number]  CC: Bilateral knee pain; RT>LT  SUBJECTIVE: History from: patient. Paula Best is a 64 y.o. female complains of bilateral knee pain RT>LT that began few months.  Denies a precipitating event or specific injury.  Localizes the pain to the front of both knees.  Describes the pain as intermittent and achy in character.  Has tried naproxen prescribed by PCP without relief.  Symptoms are made worse with stair climbing, and at night.  Denies fever, chills, erythema, ecchymosis, effusion, weakness, numbness and tingling.    Patient also mentions rash to underneath bilateral breast x 1 day.  Denies precipitating event or trauma.  Has tried OTC witch hazel without relief.  Denies similar symptoms in the past.  Denies pain or itching at this time.     ROS: As per HPI.  All other pertinent ROS negative.     Past Medical History:  Diagnosis Date  . Anemia, iron deficiency   . Asthma   . Heart murmur, systolic   . Hypertension   . Lipoma   . LVH (left ventricular hypertrophy)    mild- Echo 3/11  . Menorrhagia   . Obesity   . Tobacco user   . Vitamin D deficiency    Past Surgical History:  Procedure Laterality Date  . APPENDECTOMY    . DENTAL SURGERY    . LIPOMA EXCISION  May 29, Dr Arnoldo Morale  . TUBAL LIGATION  26   No Known Allergies No current facility-administered medications on file prior to encounter.   Current Outpatient Medications on File Prior to Encounter  Medication Sig Dispense Refill  . amLODipine (NORVASC) 10 MG tablet TAKE 1 TABLET(10 MG) BY MOUTH DAILY 90 tablet 1  . hydrochlorothiazide (HYDRODIURIL) 25 MG tablet TAKE 1 TABLET(25 MG) BY MOUTH DAILY 90 tablet 1   Social History   Socioeconomic History  . Marital status: Divorced    Spouse name: Not on file  . Number of children: 3  . Years of education: 11th   . Highest education level: Not on file  Occupational History  . Occupation: Advice worker for  [employer]  . Smoking status: Former Smoker    Packs/day: 0.50    Types: Cigarettes    Quit date: 12/31/2017    Years since quitting: 1.5  . Smokeless tobacco: Never Used  Substance and Sexual Activity  . Alcohol use: No    Alcohol/week: 0.0 standard drinks  . Drug use: No  . Sexual activity: Yes    Partners: Male  Other Topics Concern  . Not on file  Social History Narrative  . Not on file   Social Determinants of Health   Financial Resource Strain:   . Difficulty of Paying Living Expenses:   Food Insecurity:   . Worried About Charity fundraiser in the Last Year:   . Arboriculturist in the Last Year:   Transportation Needs:   . Film/video editor (Medical):   Marland Kitchen Lack of Transportation (Non-Medical):   Physical Activity:   . Days of Exercise per Week:   . Minutes of Exercise per Session:   Stress:   . Feeling of Stress :   Social Connections:   . Frequency of Communication with Friends and Family:   . Frequency of Social Gatherings with Friends and Family:   . Attends Religious Services:   . Active Member of Clubs or Organizations:   . Attends Club  or Organization Meetings:   Marland Kitchen Marital Status:   Intimate Partner Violence:   . Fear of Current or Ex-Partner:   . Emotionally Abused:   Marland Kitchen Physically Abused:   . Sexually Abused:    Family History  Problem Relation Age of Onset  . Kidney disease Mother   . Hypertension Brother   . Asthma Sister     OBJECTIVE:  Vitals:   07/24/19 1521  BP: (!) 155/104  Pulse: 87  Resp: 16  Temp: 98.2 F (36.8 C)  TempSrc: Tympanic  SpO2: 96%    General appearance: ALERT; in no acute distress.  Head: NCAT Lungs: Normal respiratory effort Musculoskeletal: Bilateral knees Inspection: Skin warm, dry, clear and intact without obvious erythema, effusion, or ecchymosis.  Palpation: mildly TTP over anterior knees R>L ROM: FROM active and passive Strength: 5/5 knee abduction, 5/5 knee adduction, 5/5  knee flexion, 5/5 knee extension, 5/5 dorsiflexion, 5/5 plantar flexion Skin: warm and dry; erythematous macular rash diffuse to inferior breast folds, R>L, NTTP, no bleeding or drainage Neurologic: Ambulates without difficulty; Sensation intact about the upper/ lower extremities Psychological: alert and cooperative; normal mood and affect  ASSESSMENT & PLAN:  1. Bilateral chronic knee pain   2. Intertrigo   3. Yeast infection     Meds ordered this encounter  Medications  . predniSONE (DELTASONE) 20 MG tablet    Sig: Take 1 tablet (20 mg total) by mouth 2 (two) times daily with a meal for 5 days.    Dispense:  10 tablet    Refill:  0    Order Specific Question:   Supervising Provider    Answer:   Raylene Everts [id number]  . clotrimazole (LOTRIMIN) 1 % cream    Sig: Apply to affected area 2 times daily    Dispense:  60 g    Refill:  0    Order Specific Question:   Supervising Provider    Answer:   Raylene Everts [id number]   Knee pain: Continue conservative management of rest, ice, and gentle stretches Prednisone prescribed.  Take as directed and to completion Follow up with PCP if symptoms persist Return or go to the ER if you have any new or worsening symptoms (fever, chills, redness, swelling, bruising, swelling, worsening symptoms, despite treatment, etc...)   Rash:  Daily cleansing of skin folds with a mild cleanser followed by drying of affected area with a soft cloth or hair dryer on a cool setting Airing of affected area when feasible Daily application of drying powders Use of absorbent material or clothing, such as cotton or merino wool, to separate skin in folds Clotrimazole prescribed.  Use as instructed Follow up with PCP next week for recheck and to ensure your symptoms are improving Go to the ED if the patient has any new or worsening symptoms such as fever, chills, redness, swelling, discharged, bleeding, etc...   Reviewed expectations re: course of  current medical issues. Questions answered. Outlined signs and symptoms indicating need for more acute intervention. Patient verbalized understanding. After Visit Summary given.    Lestine Box, PA-C 07/24/19 1548

## 2019-07-24 NOTE — Discharge Instructions (Addendum)
Knee pain: Continue conservative management of rest, ice, and gentle stretches Prednisone prescribed.  Take as directed and to completion Follow up with PCP if symptoms persist Return or go to the ER if you have any new or worsening symptoms (fever, chills, redness, swelling, bruising, swelling, worsening symptoms, despite treatment, etc...)   Rash:  Daily cleansing of skin folds with a mild cleanser followed by drying of affected area with a soft cloth or hair dryer on a cool setting Airing of affected area when feasible Daily application of drying powders Use of absorbent material or clothing, such as cotton or merino wool, to separate skin in folds Clotrimazole prescribed.  Use as instructed Follow up with PCP next week for recheck and to ensure your symptoms are improving Go to the ED if the patient has any new or worsening symptoms such as fever, chills, redness, swelling, discharged, bleeding, etc..Marland Kitchen

## 2019-09-04 ENCOUNTER — Other Ambulatory Visit: Payer: Self-pay | Admitting: Family Medicine

## 2019-09-22 ENCOUNTER — Other Ambulatory Visit: Payer: Self-pay

## 2019-09-22 ENCOUNTER — Ambulatory Visit (INDEPENDENT_AMBULATORY_CARE_PROVIDER_SITE_OTHER): Payer: BC Managed Care – PPO | Admitting: Family Medicine

## 2019-09-22 ENCOUNTER — Encounter: Payer: Self-pay | Admitting: Family Medicine

## 2019-09-22 VITALS — BP 128/80 | HR 89 | Resp 16 | Ht 62.0 in | Wt 176.0 lb

## 2019-09-22 DIAGNOSIS — Z1211 Encounter for screening for malignant neoplasm of colon: Secondary | ICD-10-CM | POA: Diagnosis not present

## 2019-09-22 DIAGNOSIS — E669 Obesity, unspecified: Secondary | ICD-10-CM

## 2019-09-22 DIAGNOSIS — I10 Essential (primary) hypertension: Secondary | ICD-10-CM | POA: Diagnosis not present

## 2019-09-22 DIAGNOSIS — E559 Vitamin D deficiency, unspecified: Secondary | ICD-10-CM

## 2019-09-22 DIAGNOSIS — M25562 Pain in left knee: Secondary | ICD-10-CM | POA: Diagnosis not present

## 2019-09-22 DIAGNOSIS — G8929 Other chronic pain: Secondary | ICD-10-CM

## 2019-09-22 DIAGNOSIS — Z1231 Encounter for screening mammogram for malignant neoplasm of breast: Secondary | ICD-10-CM

## 2019-09-22 DIAGNOSIS — E785 Hyperlipidemia, unspecified: Secondary | ICD-10-CM

## 2019-09-22 NOTE — Patient Instructions (Addendum)
Annual physical exam with MD, no pap  November, call if you need me before, TdAP at visit   Excellent blood pressure, keep it up!  Please schedule mammogram at checkout, due in Novemebr  PLEASE get your covid vaccines starting this week   PLEASE follow through and  Have your colonoscopy  I will refer you again, but please call  Continue low fat diet  Fasting chem7 and EGFr, TSH and vit D  5 days before November visit  Please start taking calcium every day between 1000 to 1200 mg , and continue vit D  For knee , use  tylenol 650 mg tablet one to two daily as needed  Thanks for choosing [facility], we consider it a privelige to serve you.

## 2019-09-23 ENCOUNTER — Encounter: Payer: Self-pay | Admitting: Family Medicine

## 2019-09-23 ENCOUNTER — Encounter (INDEPENDENT_AMBULATORY_CARE_PROVIDER_SITE_OTHER): Payer: Self-pay | Admitting: *Deleted

## 2019-09-23 NOTE — Assessment & Plan Note (Signed)
Tylenol ES one to two daily as needed

## 2019-09-23 NOTE — Assessment & Plan Note (Signed)
Hyperlipidemia:Low fat diet discussed and encouraged.   Lipid Panel  Lab Results  Component Value Date   CHOL 201 (H) 06/26/2019   HDL 66 06/26/2019   LDLCALC 118 (H) 06/26/2019   TRIG 74 06/26/2019   CHOLHDL 3.0 06/26/2019   Needs to reduce fat intake

## 2019-09-23 NOTE — Progress Notes (Signed)
   Paula Best     MRN: [id number]      DOB: 08-Sep-1955   HPI Paula Best is here for follow up and re-evaluation of chronic medical conditions, medication management and review of any available recent lab and radiology data.  Preventive health is updated, specifically  Cancer screening and Immunization.   Was reently at urgent care for knee pain andrash under brast, rash is resolved. Right knee pain still flares The PT denies any adverse reactions to current medications since the last visit.  There are no new concerns.  There are no specific complaints   ROS Denies recent fever or chills. Denies sinus pressure, nasal congestion, ear pain or sore throat. Denies chest congestion, productive cough or wheezing. Denies chest pains, palpitations and leg swelling Denies abdominal pain, nausea, vomiting,diarrhea or constipation.   Denies dysuria, frequency, hesitancy or incontinence.  Denies headaches, seizures, numbness, or tingling. Denies depression, anxiety or insomnia. Denies skin break down or rash.   PE  BP 128/80   Pulse 89   Resp 16   Ht 5\' 2"  (1.575 m)   Wt 176 lb (79.8 kg)   LMP 07/25/2011   SpO2 94%   BMI 32.19 kg/m   Patient alert and oriented and in no cardiopulmonary distress.  HEENT: No facial asymmetry, EOMI,     Neck supple .  Chest: Clear to auscultation bilaterally.  CVS: S1, S2 no murmurs, no S3.Regular rate.  ABD: Soft non tender.   Ext: No edema  MS: Adequate ROM spine, shoulders, hips and reduced in  knees.  Skin: Intact, no ulcerations or rash noted.  Psych: Good eye contact, normal affect. Memory intact not anxious or depressed appearing.  CNS: CN 2-12 intact, power,  normal throughout.no focal deficits noted.   Assessment & Plan   Essential hypertension Controlled, no change in medication DASH diet and commitment to daily physical activity for a minimum of 30 minutes discussed and encouraged, as a part of hypertension  management. The importance of attaining a healthy weight is also discussed.  BP/Weight 09/22/2019 07/24/2019 06/23/2019 01/14/2019 09/10/2018 27/07/1698 04/01/4942  Systolic BP 967 591 638 466 599 357 017  Diastolic BP 80 793 84 80 92 80 84  Wt. (Lbs) 176 - 187 188 194 186.12 178  BMI 32.19 - 34.2 34.39 35.48 34.04 32.56       Knee pain, left Tylenol ES one to two daily as needed  Hyperlipidemia Hyperlipidemia:Low fat diet discussed and encouraged.   Lipid Panel  Lab Results  Component Value Date   CHOL 201 (H) 06/26/2019   HDL 66 06/26/2019   LDLCALC 118 (H) 06/26/2019   TRIG 74 06/26/2019   CHOLHDL 3.0 06/26/2019   Needs to reduce fat intake   Obesity (BMI 30.0-34.9)  Patient re-educated about  the importance of commitment to a  minimum of 150 minutes of exercise per week as able.  The importance of healthy food choices with portion control discussed, as well as eating regularly and within a 12 hour window most days. The need to choose "clean , green" food 50 to 75% of the time is discussed, as well as to make water the primary drink and set a goal of 64 ounces water daily.    Weight /BMI 09/22/2019 06/23/2019 01/14/2019  WEIGHT 176 lb 187 lb 188 lb  HEIGHT 5\' 2"  5\' 2"  5\' 2"   BMI 32.19 kg/m2 34.2 kg/m2 34.39 kg/m2

## 2019-09-23 NOTE — Assessment & Plan Note (Signed)
Controlled, no change in medication DASH diet and commitment to daily physical activity for a minimum of 30 minutes discussed and encouraged, as a part of hypertension management. The importance of attaining a healthy weight is also discussed.  BP/Weight 09/22/2019 07/24/2019 06/23/2019 01/14/2019 09/10/2018 05/0/5678 11/02/3386  Systolic BP 266 664 861 612 240 018 097  Diastolic BP 80 044 84 80 92 80 84  Wt. (Lbs) 176 - 187 188 194 186.12 178  BMI 32.19 - 34.2 34.39 35.48 34.04 32.56

## 2019-09-23 NOTE — Assessment & Plan Note (Signed)
  Patient re-educated about  the importance of commitment to a  minimum of 150 minutes of exercise per week as able.  The importance of healthy food choices with portion control discussed, as well as eating regularly and within a 12 hour window most days. The need to choose "clean , green" food 50 to 75% of the time is discussed, as well as to make water the primary drink and set a goal of 64 ounces water daily.    Weight /BMI 09/22/2019 06/23/2019 01/14/2019  WEIGHT 176 lb 187 lb 188 lb  HEIGHT 5\' 2"  5\' 2"  5\' 2"   BMI 32.19 kg/m2 34.2 kg/m2 34.39 kg/m2

## 2019-11-09 ENCOUNTER — Telehealth: Payer: Self-pay

## 2019-11-09 NOTE — Telephone Encounter (Signed)
Patient contacted to schedule appt for knee pain

## 2019-11-23 ENCOUNTER — Other Ambulatory Visit: Payer: Self-pay

## 2019-11-23 ENCOUNTER — Ambulatory Visit (HOSPITAL_COMMUNITY)
Admission: RE | Admit: 2019-11-23 | Discharge: 2019-11-23 | Disposition: A | Discharge status: Home / Self Care | Payer: BC Managed Care – PPO | Source: Ambulatory Visit | Attending: Family Medicine | Admitting: Family Medicine

## 2019-11-23 ENCOUNTER — Ambulatory Visit (INDEPENDENT_AMBULATORY_CARE_PROVIDER_SITE_OTHER): Payer: BC Managed Care – PPO | Admitting: Family Medicine

## 2019-11-23 ENCOUNTER — Encounter: Payer: Self-pay | Admitting: Family Medicine

## 2019-11-23 VITALS — BP 138/86 | HR 96 | Resp 16 | Ht 62.0 in | Wt 175.1 lb

## 2019-11-23 DIAGNOSIS — M25561 Pain in right knee: Secondary | ICD-10-CM | POA: Diagnosis not present

## 2019-11-23 DIAGNOSIS — E669 Obesity, unspecified: Secondary | ICD-10-CM | POA: Diagnosis not present

## 2019-11-23 DIAGNOSIS — M25562 Pain in left knee: Secondary | ICD-10-CM | POA: Diagnosis not present

## 2019-11-23 DIAGNOSIS — I10 Essential (primary) hypertension: Secondary | ICD-10-CM | POA: Diagnosis not present

## 2019-11-23 DIAGNOSIS — G8929 Other chronic pain: Secondary | ICD-10-CM

## 2019-11-23 DIAGNOSIS — M25461 Effusion, right knee: Secondary | ICD-10-CM | POA: Diagnosis not present

## 2019-11-23 DIAGNOSIS — M7989 Other specified soft tissue disorders: Secondary | ICD-10-CM | POA: Diagnosis not present

## 2019-11-23 MED ORDER — PREDNISONE 10 MG PO TABS: 10.0000 mg | ORAL_TABLET | Freq: Two times a day (BID) | ORAL | 0 refills | Status: DC

## 2019-11-23 MED ORDER — IBUPROFEN 800 MG PO TABS: 800.0000 mg | ORAL_TABLET | Freq: Three times a day (TID) | ORAL | 0 refills | Status: DC | PRN

## 2019-11-23 MED ORDER — FAMOTIDINE 40 MG PO TABS: 40.0000 mg | ORAL_TABLET | Freq: Every day | ORAL | 0 refills | Status: DC

## 2019-11-23 NOTE — Patient Instructions (Addendum)
Keep appointment in November , call if you need me sooner  Please get  covid vaccines this week   X ray of both knees today  Ibuprofen and prednisone and pepcid  ordered for knees for 5 days if persists or recurs soon I recommend Orthopedic evaluation, call and let me know  Please schedule and follow through with colonoscopy  Thanks for choosing [facility], we consider it a privelige to serve you.   Careful not to fall!

## 2019-11-25 ENCOUNTER — Encounter: Payer: Self-pay | Admitting: Family Medicine

## 2019-11-25 NOTE — Assessment & Plan Note (Signed)
  Patient re-educated about  the importance of commitment to a  minimum of 150 minutes of exercise per week as able.  The importance of healthy food choices with portion control discussed, as well as eating regularly and within a 12 hour window most days. The need to choose "clean , green" food 50 to 75% of the time is discussed, as well as to make water the primary drink and set a goal of 64 ounces water daily.    Weight /BMI 11/23/2019 09/22/2019 06/23/2019  WEIGHT 175 lb 1.9 oz 176 lb 187 lb  HEIGHT 5\' 2"  5\' 2"  5\' 2"   BMI 32.03 kg/m2 32.19 kg/m2 34.2 kg/m2

## 2019-11-25 NOTE — Progress Notes (Signed)
   Paula Best     MRN: [id number]      DOB: 05-26-1955   HPI Paula Best is here for follow up and re-evaluation of chronic medical conditions, medication management and review of any available recent lab and radiology data.  Preventive health is updated, specifically  Cancer screening and Immunization.   C/o b ilateral knee pain, right worse than left, denies falls but at times has instability. The PT denies any adverse reactions to current medications since the last visit.     ROS Denies recent fever or chills. Denies sinus pressure, nasal congestion, ear pain or sore throat. Denies chest congestion, productive cough or wheezing. Denies chest pains, palpitations and leg swelling Denies abdominal pain, nausea, vomiting,diarrhea or constipation.   Denies dysuria, frequency, hesitancy or incontinence.  Denies headaches, seizures, numbness, or tingling. Denies depression, anxiety or insomnia. Denies skin break down or rash.   PE  BP 138/86   Pulse 96   Resp 16   Ht 5\' 2"  (1.575 m)   Wt 175 lb 1.9 oz (79.4 kg)   LMP 07/25/2011   SpO2 97%   BMI 32.03 kg/m   Patient alert and oriented and in no cardiopulmonary distress.  HEENT: No facial asymmetry, EOMI,     Neck supple .  Chest: Clear to auscultation bilaterally.  CVS: S1, S2 no murmurs, no S3.Regular rate.  ABD: Soft non tender.   Ext: No edema  MS: Adequate ROM spine, shoulders, hips and reduced in knees.right worse than left with deformity and crepitus and swelling  Skin: Intact, no ulcerations or rash noted.  Psych: Good eye contact, normal affect. Memory intact not anxious or depressed appearing.  CNS: CN 2-12 intact, power,  normal throughout.no focal deficits noted.   Assessment & Plan  Knee pain, bilateral Acute on chronic pain right knee greater than left.  X-ray of both knees today.  I recommend orthopedic evaluation if this recurs or does not resolve.  Patient agrees with this plan.  Obesity  (BMI 30.0-34.9)  Patient re-educated about  the importance of commitment to a  minimum of 150 minutes of exercise per week as able.  The importance of healthy food choices with portion control discussed, as well as eating regularly and within a 12 hour window most days. The need to choose "clean , green" food 50 to 75% of the time is discussed, as well as to make water the primary drink and set a goal of 64 ounces water daily.    Weight /BMI 11/23/2019 09/22/2019 06/23/2019  WEIGHT 175 lb 1.9 oz 176 lb 187 lb  HEIGHT 5\' 2"  5\' 2"  5\' 2"   BMI 32.03 kg/m2 32.19 kg/m2 34.2 kg/m2      Essential hypertension Controlled, no change in medication

## 2019-11-25 NOTE — Assessment & Plan Note (Signed)
Controlled, no change in medication  

## 2019-11-25 NOTE — Assessment & Plan Note (Signed)
Acute on chronic pain right knee greater than left.  X-ray of both knees today.  I recommend orthopedic evaluation if this recurs or does not resolve.  Patient agrees with this plan.

## 2019-12-28 ENCOUNTER — Telehealth: Payer: Self-pay | Admitting: Family Medicine

## 2019-12-28 ENCOUNTER — Other Ambulatory Visit: Payer: Self-pay

## 2019-12-28 DIAGNOSIS — M25562 Pain in left knee: Secondary | ICD-10-CM

## 2019-12-28 DIAGNOSIS — G8929 Other chronic pain: Secondary | ICD-10-CM

## 2019-12-28 NOTE — Telephone Encounter (Signed)
Patient requesting a referral to [facility] in [place] for knee and leg pain phone is [phone]

## 2019-12-28 NOTE — Telephone Encounter (Signed)
Referral entered  

## 2019-12-28 NOTE — Telephone Encounter (Signed)
Patient requesting a referral to [facility] in  for both knees ph# [phone]

## 2020-01-18 DIAGNOSIS — M170 Bilateral primary osteoarthritis of knee: Secondary | ICD-10-CM | POA: Diagnosis not present

## 2020-01-18 DIAGNOSIS — M1711 Unilateral primary osteoarthritis, right knee: Secondary | ICD-10-CM | POA: Diagnosis not present

## 2020-02-08 ENCOUNTER — Encounter: Payer: Self-pay | Admitting: Family Medicine

## 2020-02-08 ENCOUNTER — Other Ambulatory Visit: Payer: Self-pay

## 2020-02-08 ENCOUNTER — Ambulatory Visit (INDEPENDENT_AMBULATORY_CARE_PROVIDER_SITE_OTHER): Payer: BC Managed Care – PPO | Admitting: Family Medicine

## 2020-02-08 VITALS — BP 158/98 | HR 88 | Resp 16 | Ht 62.0 in | Wt 178.0 lb

## 2020-02-08 DIAGNOSIS — Z23 Encounter for immunization: Secondary | ICD-10-CM

## 2020-02-08 DIAGNOSIS — Z0001 Encounter for general adult medical examination with abnormal findings: Secondary | ICD-10-CM | POA: Diagnosis not present

## 2020-02-08 DIAGNOSIS — R7301 Impaired fasting glucose: Secondary | ICD-10-CM

## 2020-02-08 DIAGNOSIS — Z13220 Encounter for screening for lipoid disorders: Secondary | ICD-10-CM

## 2020-02-08 DIAGNOSIS — R7303 Prediabetes: Secondary | ICD-10-CM

## 2020-02-08 DIAGNOSIS — I10 Essential (primary) hypertension: Secondary | ICD-10-CM | POA: Diagnosis not present

## 2020-02-08 DIAGNOSIS — Z0000 Encounter for general adult medical examination without abnormal findings: Secondary | ICD-10-CM

## 2020-02-08 DIAGNOSIS — E559 Vitamin D deficiency, unspecified: Secondary | ICD-10-CM

## 2020-02-08 LAB — POCT GLYCOSYLATED HEMOGLOBIN (HGB A1C): Hemoglobin A1C: 6.2 % — AB (ref 4.0–5.6)

## 2020-02-08 MED ORDER — TRIAMTERENE-HCTZ 37.5-25 MG PO TABS: 1.0000 | ORAL_TABLET | Freq: Every day | ORAL | 0 refills | Status: DC

## 2020-02-08 NOTE — Assessment & Plan Note (Signed)

## 2020-02-08 NOTE — Progress Notes (Signed)
Paula Best     MRN: [id number]      DOB: 05/01/1955  HPI: Patient is in for annual physical exam. Uncontrolled hypertension is also addressed. Recent labs,  are reviewed. Immunization is reviewed , and  updated    PE: BP (!) 158/98   Pulse 88   Resp 16   Ht 5\' 2"  (1.575 m)   Wt 178 lb 0.6 oz (80.8 kg)   LMP 07/25/2011   SpO2 93%   BMI 32.56 kg/m   Pleasant  female, alert and oriented x 3, in no cardio-pulmonary distress. Afebrile. HEENT No facial trauma or asymetry. Sinuses non tender.  Extra occullar muscles intact.. External ears normal, . Neck: supple, no adenopathy,JVD or thyromegaly.No bruits.  Chest: Clear to ascultation bilaterally.No crackles or wheezes. Non tender to palpation  Breast: Asymptomatic, has upcoming mmmogram  Cardiovascular system; Heart sounds normal,  S1 and  S2 ,no S3.  No murmur, or thrill. Apical beat not displaced Peripheral pulses normal.  Abdomen: Soft, non tender, no organomegaly or masses. No bruits. Bowel sounds normal. No guarding, tenderness or rebound.   .   Musculoskeletal exam: Full ROM of spine, hips , shoulders and knees. No deformity ,swelling or crepitus noted. No muscle wasting or atrophy.   Neurologic: Cranial nerves 2 to 12 intact. Power, tone ,sensation and reflexes normal throughout. No disturbance in gait. No tremor.  Skin: Intact, no ulceration, erythema , scaling or rash noted. Pigmentation normal throughout  Psych; Normal mood and affect. Judgement and concentration normal   Assessment & Plan:  Annual physical exam Annual exam as documented. Counseling done  re healthy lifestyle involving commitment to 150 minutes exercise per week, heart healthy diet, and attaining healthy weight.The importance of adequate sleep also discussed. Regular seat belt use and home safety, is also discussed. Changes in health habits are decided on by the patient with goals and time frames  set for  achieving them. Immunization and cancer screening needs are specifically addressed at this visit.   Essential hypertension Uncontrolled, start maxzide and stop HCTZ, continue amlodipine DASH diet and commitment to daily physical activity for a minimum of 30 minutes discussed and encouraged, as a part of hypertension management. The importance of attaining a healthy weight is also discussed.  BP/Weight 02/08/2020 11/23/2019 09/22/2019 07/24/2019 06/23/2019 01/14/2019 1/54/0086  Systolic BP 761 950 932 671 245 809 983  Diastolic BP 98 86 80 382 84 80 92  Wt. (Lbs) 178.04 175.12 176 - 187 188 194  BMI 32.56 32.03 32.19 - 34.2 34.39 35.48       Prediabetes Patient educated about the importance of limiting  Carbohydrate intake , the need to commit to daily physical activity for a minimum of 30 minutes , and to commit weight loss. The fact that changes in all these areas will reduce or eliminate all together the development of diabetes is stressed. Improved    Diabetic Labs Latest Ref Rng & Units 02/08/2020 06/26/2019 01/16/2019 02/27/2018 08/27/2017  HbA1c 4.0 - 5.6 % 6.2(A) 6.4(H) 6.5(H) 6.2(H) 6.0(H)  Chol <200 mg/dL - 201(H) 197 - 193  HDL > OR = 50 mg/dL - 66 64 - 59  Calc LDL mg/dL (calc) - 118(H) 117(H) - 117(H)  Triglycerides <150 mg/dL - 74 66 - 78  Creatinine 0.50 - 0.99 mg/dL - 0.70 0.62 0.80 0.71   BP/Weight 02/08/2020 11/23/2019 09/22/2019 07/24/2019 06/23/2019 01/14/2019 07/29/3974  Systolic BP 734 193 790 240 973 532 992  Diastolic BP 98 86  80 104 84 80 92  Wt. (Lbs) 178.04 175.12 176 - 187 188 194  BMI 32.56 32.03 32.19 - 34.2 34.39 35.48   No flowsheet data found.

## 2020-02-08 NOTE — Patient Instructions (Addendum)
Follow-up in office to reevaluate blood pressure with MD in 2 months call if you need me sooner.  Tdap today.and glycoHB  Please schedule appointment for flu vaccine with nurse at checkout.  Blood pressure is high.  Stop HCTZ and continue amlodipine 10 mg 1 daily.  In  place of  HCTZ Z is Maxide 25 mg 1 daily, start this today please  Fasting lipid Chem-7 and EGFR TSH and vitamin D to be drawn 1 week before next visit.  It is important that you exercise regularly at least 30 minutes 5 times a week. If you develop chest pain, have severe difficulty breathing, or feel very tired, stop exercising immediately and seek medical attention    Thanks for choosing  [facility], we consider it a privelige to serve you.

## 2020-02-09 ENCOUNTER — Telehealth: Payer: Self-pay | Admitting: Family Medicine

## 2020-02-09 ENCOUNTER — Encounter: Payer: Self-pay | Admitting: Family Medicine

## 2020-02-09 NOTE — Telephone Encounter (Signed)
Physician form Copied Noted Sleeved

## 2020-02-09 NOTE — Assessment & Plan Note (Signed)
Patient educated about the importance of limiting  Carbohydrate intake , the need to commit to daily physical activity for a minimum of 30 minutes , and to commit weight loss. The fact that changes in all these areas will reduce or eliminate all together the development of diabetes is stressed. Improved    Diabetic Labs Latest Ref Rng & Units 02/08/2020 06/26/2019 01/16/2019 02/27/2018 08/27/2017  HbA1c 4.0 - 5.6 % 6.2(A) 6.4(H) 6.5(H) 6.2(H) 6.0(H)  Chol <200 mg/dL - 201(H) 197 - 193  HDL > OR = 50 mg/dL - 66 64 - 59  Calc LDL mg/dL (calc) - 118(H) 117(H) - 117(H)  Triglycerides <150 mg/dL - 74 66 - 78  Creatinine 0.50 - 0.99 mg/dL - 0.70 0.62 0.80 0.71   BP/Weight 02/08/2020 11/23/2019 09/22/2019 07/24/2019 06/23/2019 01/14/2019 9/73/5329  Systolic BP 924 268 341 962 229 798 921  Diastolic BP 98 86 80 194 84 80 92  Wt. (Lbs) 178.04 175.12 176 - 187 188 194  BMI 32.56 32.03 32.19 - 34.2 34.39 35.48   No flowsheet data found.

## 2020-02-09 NOTE — Assessment & Plan Note (Signed)
Uncontrolled, start maxzide and stop HCTZ, continue amlodipine DASH diet and commitment to daily physical activity for a minimum of 30 minutes discussed and encouraged, as a part of hypertension management. The importance of attaining a healthy weight is also discussed.  BP/Weight 02/08/2020 11/23/2019 09/22/2019 07/24/2019 06/23/2019 01/14/2019 1/54/8845  Systolic BP 733 448 301 599 689 570 220  Diastolic BP 98 86 80 266 84 80 92  Wt. (Lbs) 178.04 175.12 176 - 187 188 194  BMI 32.56 32.03 32.19 - 34.2 34.39 35.48

## 2020-02-22 ENCOUNTER — Other Ambulatory Visit: Payer: Self-pay

## 2020-02-22 ENCOUNTER — Ambulatory Visit (HOSPITAL_COMMUNITY): Payer: BC Managed Care – PPO

## 2020-02-22 ENCOUNTER — Ambulatory Visit (HOSPITAL_COMMUNITY)
Admission: RE | Admit: 2020-02-22 | Discharge: 2020-02-22 | Disposition: A | Discharge status: Home / Self Care | Payer: BC Managed Care – PPO | Source: Ambulatory Visit | Attending: Family Medicine | Admitting: Family Medicine

## 2020-02-22 DIAGNOSIS — Z1231 Encounter for screening mammogram for malignant neoplasm of breast: Secondary | ICD-10-CM

## 2020-03-02 DIAGNOSIS — M170 Bilateral primary osteoarthritis of knee: Secondary | ICD-10-CM | POA: Diagnosis not present

## 2020-03-10 ENCOUNTER — Other Ambulatory Visit: Payer: Self-pay | Admitting: Family Medicine

## 2020-04-04 DIAGNOSIS — I10 Essential (primary) hypertension: Secondary | ICD-10-CM | POA: Diagnosis not present

## 2020-04-04 DIAGNOSIS — Z13220 Encounter for screening for lipoid disorders: Secondary | ICD-10-CM | POA: Diagnosis not present

## 2020-04-04 DIAGNOSIS — E559 Vitamin D deficiency, unspecified: Secondary | ICD-10-CM | POA: Diagnosis not present

## 2020-04-05 LAB — LIPID PANEL
Chol/HDL Ratio: 3.2 ratio (ref 0.0–4.4)
Cholesterol, Total: 230 mg/dL — ABNORMAL HIGH (ref 100–199)
HDL: 71 mg/dL (ref >39)
LDL Chol Calc (NIH): 144 mg/dL — ABNORMAL HIGH (ref 0–99)
Triglycerides: 89 mg/dL (ref 0–149)
VLDL Cholesterol Cal: 15 mg/dL (ref 5–40)

## 2020-04-05 LAB — BMP8+EGFR
BUN/Creatinine Ratio: 14 (ref 12–28)
BUN: 11 mg/dL (ref 8–27)
CO2: 23 mmol/L (ref 20–29)
Calcium: 9.5 mg/dL (ref 8.7–10.3)
Chloride: 102 mmol/L (ref 96–106)
Creatinine, Ser: 0.76 mg/dL (ref 0.57–1.00)
GFR calc Af Amer: 96 mL/min/{1.73_m2} (ref >59)
GFR calc non Af Amer: 83 mL/min/{1.73_m2} (ref >59)
Glucose: 88 mg/dL (ref 65–99)
Potassium: 4.1 mmol/L (ref 3.5–5.2)
Sodium: 140 mmol/L (ref 134–144)

## 2020-04-05 LAB — VITAMIN D 25 HYDROXY (VIT D DEFICIENCY, FRACTURES): Vit D, 25-Hydroxy: 30.7 ng/mL (ref 30.0–100.0)

## 2020-04-05 LAB — TSH: TSH: 2.02 u[IU]/mL (ref 0.450–4.500)

## 2020-04-07 ENCOUNTER — Encounter: Payer: Self-pay | Admitting: Family Medicine

## 2020-04-07 ENCOUNTER — Other Ambulatory Visit: Payer: Self-pay

## 2020-04-07 ENCOUNTER — Telehealth (INDEPENDENT_AMBULATORY_CARE_PROVIDER_SITE_OTHER): Payer: BC Managed Care – PPO | Admitting: Family Medicine

## 2020-04-07 VITALS — BP 124/84

## 2020-04-07 DIAGNOSIS — E7849 Other hyperlipidemia: Secondary | ICD-10-CM | POA: Diagnosis not present

## 2020-04-07 DIAGNOSIS — I10 Essential (primary) hypertension: Secondary | ICD-10-CM

## 2020-04-07 DIAGNOSIS — F5101 Primary insomnia: Secondary | ICD-10-CM

## 2020-04-07 DIAGNOSIS — R7303 Prediabetes: Secondary | ICD-10-CM

## 2020-04-07 DIAGNOSIS — E559 Vitamin D deficiency, unspecified: Secondary | ICD-10-CM | POA: Diagnosis not present

## 2020-04-07 DIAGNOSIS — R7301 Impaired fasting glucose: Secondary | ICD-10-CM | POA: Diagnosis not present

## 2020-04-07 DIAGNOSIS — G4700 Insomnia, unspecified: Secondary | ICD-10-CM | POA: Insufficient documentation

## 2020-04-07 DIAGNOSIS — E669 Obesity, unspecified: Secondary | ICD-10-CM

## 2020-04-07 MED ORDER — HYDROXYZINE HCL 25 MG PO TABS: ORAL_TABLET | ORAL | 1 refills | Status: DC

## 2020-04-07 NOTE — Assessment & Plan Note (Signed)
Hyperlipidemia:Low fat diet discussed and encouraged.   Lipid Panel  Lab Results  Component Value Date   CHOL 230 (H) 04/04/2020   HDL 71 04/04/2020   LDLCALC 144 (H) 04/04/2020   TRIG 89 04/04/2020   CHOLHDL 3.2 04/04/2020

## 2020-04-07 NOTE — Progress Notes (Signed)
Virtual Visit via Telephone Note  I connected with Paula Best on 04/07/20 at 10:00 AM EST by telephone and verified that I am speaking with the correct person using two identifiers.  Location: Patient: home Provider: work   I discussed the limitations, risks, security and privacy concerns of performing an evaluation and management service by telephone and the availability of in person appointments. I also discussed with the patient that there may be a patient responsible charge related to this service. The patient expressed understanding and agreed to proceed.   History of Present Illness: F/u chronic problems, medication and routine health mainatainace Denies recent fever or chills. Denies sinus pressure, nasal congestion, ear pain or sore throat. Denies chest congestion, productive cough or wheezing. Denies chest pains, palpitations and leg swelling Denies abdominal pain, nausea, vomiting,diarrhea or constipation.   Denies dysuria, frequency, hesitancy or incontinence. Denies joint pain, swelling and limitation in mobility. Denies headaches, seizures, numbness, or tingling. Denies skin break down or rash. Over 1 year h/      Observations/Objective: BP 124/84   LMP 07/25/2011  Good communication with no confusion and intact memory. Alert and oriented x 3 No signs of respiratory distress during speech   Assessment and Plan: Essential hypertension Controlled, no change in medication DASH diet and commitment to daily physical activity for a minimum of 30 minutes discussed and encouraged, as a part of hypertension management. The importance of attaining a healthy weight is also discussed.  BP/Weight 04/07/2020 02/08/2020 11/23/2019 09/22/2019 07/24/2019 06/23/2019 76/16/0737  Systolic BP 106 269 485 462 703 500 938  Diastolic BP 84 98 86 80 182 84 80  Wt. (Lbs) - 178.04 175.12 176 - 187 188  BMI - 32.56 32.03 32.19 - 34.2 34.39        Hyperlipidemia Hyperlipidemia:Low fat diet discussed and encouraged.   Lipid Panel  Lab Results  Component Value Date   CHOL 230 (H) 04/04/2020   HDL 71 04/04/2020   LDLCALC 144 (H) 04/04/2020   TRIG 89 04/04/2020   CHOLHDL 3.2 04/04/2020       Prediabetes Patient educated about the importance of limiting  Carbohydrate intake , the need to commit to daily physical activity for a minimum of 30 minutes , and to commit weight loss. The fact that changes in all these areas will reduce or eliminate all together the development of diabetes is stressed.  Updated lab needed at/ before next visit.   Diabetic Labs Latest Ref Rng & Units 04/04/2020 02/08/2020 06/26/2019 01/16/2019 02/27/2018  HbA1c 4.0 - 5.6 % - 6.2(A) 6.4(H) 6.5(H) 6.2(H)  Chol 100 - 199 mg/dL 230(H) - 201(H) 197 -  HDL >39 mg/dL 71 - 66 64 -  Calc LDL 0 - 99 mg/dL 144(H) - 118(H) 117(H) -  Triglycerides 0 - 149 mg/dL 89 - 74 66 -  Creatinine 0.57 - 1.00 mg/dL 0.76 - 0.70 0.62 0.80   BP/Weight 04/07/2020 02/08/2020 11/23/2019 09/22/2019 07/24/2019 06/23/2019 99/37/1696  Systolic BP 789 381 017 510 258 527 782  Diastolic BP 84 98 86 80 423 84 80  Wt. (Lbs) - 178.04 175.12 176 - 187 188  BMI - 32.56 32.03 32.19 - 34.2 34.39   No flowsheet data found.    Obesity (BMI 30.0-34.9)  Patient re-educated about  the importance of commitment to a  minimum of 150 minutes of exercise per week as able.  The importance of healthy food choices with portion control discussed, as well as eating regularly and within a 12  hour window most days. The need to choose "clean , green" food 50 to 75% of the time is discussed, as well as to make water the primary drink and set a goal of 64 ounces water daily.    Weight /BMI 02/08/2020 11/23/2019 09/22/2019  WEIGHT 178 lb 0.6 oz 175 lb 1.9 oz 176 lb  HEIGHT 5\' 2"  5\' 2"  5\' 2"   BMI 32.56 kg/m2 32.03 kg/m2 32.19 kg/m2       Insomnia Sleep hygiene reviewed and written information  offered also. Prescription sent for  medication needed.     Follow Up Instructions:    I discussed the assessment and treatment plan with the patient. The patient was provided an opportunity to ask questions and all were answered. The patient agreed with the plan and demonstrated an understanding of the instructions.   The patient was advised to call back or seek an in-person evaluation if the symptoms worsen or if the condition fails to improve as anticipated.  I provided 20 minutes of non-face-to-face time during this encounter.   Tula Nakayama, MD

## 2020-04-07 NOTE — Assessment & Plan Note (Signed)
  Patient re-educated about  the importance of commitment to a  minimum of 150 minutes of exercise per week as able.  The importance of healthy food choices with portion control discussed, as well as eating regularly and within a 12 hour window most days. The need to choose "clean , green" food 50 to 75% of the time is discussed, as well as to make water the primary drink and set a goal of 64 ounces water daily.    Weight /BMI 02/08/2020 11/23/2019 09/22/2019  WEIGHT 178 lb 0.6 oz 175 lb 1.9 oz 176 lb  HEIGHT 5\' 2"  5\' 2"  5\' 2"   BMI 32.56 kg/m2 32.03 kg/m2 32.19 kg/m2

## 2020-04-07 NOTE — Patient Instructions (Addendum)
Follow-up in office with MD in May early June call if you need me sooner.  Recent labs show that your total and bad cholesterol are elevated above normal range.  Please reduce fried and fatty foods and processed foods.  Increase fresh fruits and vegetables.  New to help with  sleep is hydroxyzine 25 mg take 1-2 at bedtime as needed.  Sleep goal is between 6 to 8 hours per night.  Non fast HBa1C, chem 7 and EGFr and CBC 5 days before next appointment  Please ensure you turn off all lights and sound when going to sleep.  Please follow through and make appointment for your colonoscopy this is very important.  I will ask the office staff to help you with this also.  It is important that you exercise regularly at least 30 minutes 5 times a week. If you develop chest pain, have severe difficulty breathing, or feel very tired, stop exercising immediately and seek medical attention  Think about what you will eat, plan ahead. Choose " clean, green, fresh or frozen" over canned, processed or packaged foods which are more sugary, salty and fatty. 70 to 75% of food eaten should be vegetables and fruit. Three meals at set times with snacks allowed between meals, but they must be fruit or vegetables. Aim to eat over a 12 hour period , example 7 am to 7 pm, and STOP after  your last meal of the day. Drink water,generally about 64 ounces per day, no other drink is as healthy. Fruit juice is best enjoyed in a healthy way, by EATING the fruit. Thanks for choosing [facility], we consider it a privelige to serve you.

## 2020-04-07 NOTE — Assessment & Plan Note (Signed)
Controlled, no change in medication DASH diet and commitment to daily physical activity for a minimum of 30 minutes discussed and encouraged, as a part of hypertension management. The importance of attaining a healthy weight is also discussed.  BP/Weight 04/07/2020 02/08/2020 11/23/2019 09/22/2019 07/24/2019 06/23/2019 29/56/2130  Systolic BP 865 784 696 295 284 132 440  Diastolic BP 84 98 86 80 102 84 80  Wt. (Lbs) - 178.04 175.12 176 - 187 188  BMI - 32.56 32.03 32.19 - 34.2 34.39

## 2020-04-07 NOTE — Assessment & Plan Note (Signed)
Sleep hygiene reviewed and written information offered also. Prescription sent for  medication needed.  

## 2020-04-07 NOTE — Assessment & Plan Note (Signed)
Patient educated about the importance of limiting  Carbohydrate intake , the need to commit to daily physical activity for a minimum of 30 minutes , and to commit weight loss. The fact that changes in all these areas will reduce or eliminate all together the development of diabetes is stressed.  Updated lab needed at/ before next visit.   Diabetic Labs Latest Ref Rng & Units 04/04/2020 02/08/2020 06/26/2019 01/16/2019 02/27/2018  HbA1c 4.0 - 5.6 % - 6.2(A) 6.4(H) 6.5(H) 6.2(H)  Chol 100 - 199 mg/dL 230(H) - 201(H) 197 -  HDL >39 mg/dL 71 - 66 64 -  Calc LDL 0 - 99 mg/dL 144(H) - 118(H) 117(H) -  Triglycerides 0 - 149 mg/dL 89 - 74 66 -  Creatinine 0.57 - 1.00 mg/dL 0.76 - 0.70 0.62 0.80   BP/Weight 04/07/2020 02/08/2020 11/23/2019 09/22/2019 07/24/2019 06/23/2019 00/17/4944  Systolic BP 967 591 638 466 599 357 017  Diastolic BP 84 98 86 80 793 84 80  Wt. (Lbs) - 178.04 175.12 176 - 187 188  BMI - 32.56 32.03 32.19 - 34.2 34.39   No flowsheet data found.

## 2020-05-05 ENCOUNTER — Other Ambulatory Visit: Payer: Self-pay | Admitting: Family Medicine

## 2020-08-08 ENCOUNTER — Other Ambulatory Visit: Payer: Self-pay | Admitting: Family Medicine

## 2020-08-11 ENCOUNTER — Ambulatory Visit: Payer: BC Managed Care – PPO | Admitting: Family Medicine

## 2020-08-19 DIAGNOSIS — I10 Essential (primary) hypertension: Secondary | ICD-10-CM | POA: Diagnosis not present

## 2020-08-19 DIAGNOSIS — R7301 Impaired fasting glucose: Secondary | ICD-10-CM | POA: Diagnosis not present

## 2020-08-20 LAB — CBC
Hematocrit: 43.7 % (ref 34.0–46.6)
Hemoglobin: 14.8 g/dL (ref 11.1–15.9)
MCH: 30.2 pg (ref 26.6–33.0)
MCHC: 33.9 g/dL (ref 31.5–35.7)
MCV: 89 fL (ref 79–97)
Platelets: 342 10*3/uL (ref 150–450)
RBC: 4.9 x10E6/uL (ref 3.77–5.28)
RDW: 13.4 % (ref 11.7–15.4)
WBC: 7.9 10*3/uL (ref 3.4–10.8)

## 2020-08-20 LAB — BMP8+EGFR
BUN/Creatinine Ratio: 24 (ref 12–28)
BUN: 18 mg/dL (ref 8–27)
CO2: 25 mmol/L (ref 20–29)
Calcium: 9.8 mg/dL (ref 8.7–10.3)
Chloride: 99 mmol/L (ref 96–106)
Creatinine, Ser: 0.76 mg/dL (ref 0.57–1.00)
Glucose: 99 mg/dL (ref 65–99)
Potassium: 4 mmol/L (ref 3.5–5.2)
Sodium: 138 mmol/L (ref 134–144)
eGFR: 87 mL/min/{1.73_m2} (ref >59)

## 2020-08-20 LAB — HEMOGLOBIN A1C
Est. average glucose Bld gHb Est-mCnc: 137 mg/dL
Hgb A1c MFr Bld: 6.4 % — ABNORMAL HIGH (ref 4.8–5.6)

## 2020-08-23 ENCOUNTER — Encounter: Payer: Self-pay | Admitting: Family Medicine

## 2020-08-23 ENCOUNTER — Ambulatory Visit: Payer: BC Managed Care – PPO | Admitting: Family Medicine

## 2020-08-23 ENCOUNTER — Other Ambulatory Visit: Payer: Self-pay

## 2020-08-23 VITALS — BP 143/83 | HR 82 | Temp 98.5°F | Ht 62.0 in | Wt 180.0 lb

## 2020-08-23 DIAGNOSIS — R7303 Prediabetes: Secondary | ICD-10-CM

## 2020-08-23 DIAGNOSIS — F5101 Primary insomnia: Secondary | ICD-10-CM | POA: Diagnosis not present

## 2020-08-23 DIAGNOSIS — E669 Obesity, unspecified: Secondary | ICD-10-CM

## 2020-08-23 DIAGNOSIS — E7849 Other hyperlipidemia: Secondary | ICD-10-CM

## 2020-08-23 DIAGNOSIS — I10 Essential (primary) hypertension: Secondary | ICD-10-CM | POA: Diagnosis not present

## 2020-08-23 DIAGNOSIS — Z1231 Encounter for screening mammogram for malignant neoplasm of breast: Secondary | ICD-10-CM

## 2020-08-23 DIAGNOSIS — H547 Unspecified visual loss: Secondary | ICD-10-CM

## 2020-08-23 NOTE — Patient Instructions (Signed)
Annual exam with pap in early October, call if you need me sooner  Please get covid booster  Fasting lipid, cmp and EGFR and HBA1c end September  Mammogram due in December, please schedule   Please reconsider shingles vaccine  Work on improved blood sugar, no sweet drink only water, and more regular exercise, also reduce hamburger meals  Nurse please  Assist pt with getting questionnaire for her colonoscopy , dr Olevia Perches office , she want to proceed with this and does not know where questionnaire is  Thanks for choosing [facility], we consider it a privelige to serve you.

## 2020-08-28 ENCOUNTER — Encounter: Payer: Self-pay | Admitting: Family Medicine

## 2020-08-28 NOTE — Assessment & Plan Note (Signed)
Uncontrolled, not at goal, no med chage DASH diet and commitment to daily physical activity for a minimum of 30 minutes discussed and encouraged, as a part of hypertension management. The importance of attaining a healthy weight is also discussed.  BP/Weight 08/23/2020 04/07/2020 02/08/2020 11/23/2019 09/22/2019 07/24/2019 6/70/1410  Systolic BP 301 314 388 875 797 282 060  Diastolic BP 83 84 98 86 80 104 84  Wt. (Lbs) 180 - 178.04 175.12 176 - 187  BMI 32.92 - 32.56 32.03 32.19 - 34.2

## 2020-08-28 NOTE — Assessment & Plan Note (Signed)
Patient educated about the importance of limiting  Carbohydrate intake , the need to commit to daily physical activity for a minimum of 30 minutes , and to commit weight loss. The fact that changes in all these areas will reduce or eliminate all together the development of diabetes is stressed.  Needs to reduce sweets and carbs, has worsened  Diabetic Labs Latest Ref Rng & Units 08/19/2020 04/04/2020 02/08/2020 06/26/2019 01/16/2019  HbA1c 4.8 - 5.6 % 6.4(H) - 6.2(A) 6.4(H) 6.5(H)  Chol 100 - 199 mg/dL - 230(H) - 201(H) 197  HDL >39 mg/dL - 71 - 66 64  Calc LDL 0 - 99 mg/dL - 144(H) - 118(H) 117(H)  Triglycerides 0 - 149 mg/dL - 89 - 74 66  Creatinine 0.57 - 1.00 mg/dL 0.76 0.76 - 0.70 0.62   BP/Weight 08/23/2020 04/07/2020 02/08/2020 11/23/2019 09/22/2019 07/24/2019 1/61/0960  Systolic BP 454 098 119 147 829 562 130  Diastolic BP 83 84 98 86 80 104 84  Wt. (Lbs) 180 - 178.04 175.12 176 - 187  BMI 32.92 - 32.56 32.03 32.19 - 34.2   No flowsheet data found.

## 2020-08-28 NOTE — Progress Notes (Signed)
Paula Best     MRN: [id number]      DOB: May 03, 1955   HPI Paula Best is here for follow up and re-evaluation of chronic medical conditions, medication management and review of any available recent lab and radiology data.  Preventive health is updated, specifically  Cancer screening and Immunization.   Questions or concerns regarding consultations or procedures which the PT has had in the interim are  addressed. The PT denies any adverse reactions to current medications since the last visit.  There are no new concerns.  There are no specific complaints   ROS Denies recent fever or chills. Denies sinus pressure, nasal congestion, ear pain or sore throat. Denies chest congestion, productive cough or wheezing. Denies chest pains, palpitations and leg swelling Denies abdominal pain, nausea, vomiting,diarrhea or constipation.   Denies dysuria, frequency, hesitancy or incontinence. Denies joint pain, swelling and limitation in mobility. Denies headaches, seizures, numbness, or tingling. Denies depression, anxiety or insomnia. Denies skin break down or rash.   PE  BP (!) 143/83 (BP Location: Right Arm, Patient Position: Sitting, Cuff Size: Normal)   Pulse 82   Temp 98.5 F (36.9 C) (Temporal)   Ht 5\' 2"  (1.575 m)   Wt 180 lb (81.6 kg)   LMP 07/25/2011   SpO2 98%   BMI 32.92 kg/m   Patient alert and oriented and in no cardiopulmonary distress.  HEENT: No facial asymmetry, EOMI,     Neck supple .  Chest: Clear to auscultation bilaterally.  CVS: S1, S2 systolic  murmur, no S3.Regular rate.  ABD: Soft non tender.   Ext: No edema  MS: Adequate ROM spine, shoulders, hips and knees.  Skin: Intact, no ulcerations or rash noted.  Psych: Good eye contact, normal affect. Memory intact not anxious or depressed appearing.  CNS: CN 2-12 intact, power,  normal throughout.no focal deficits noted.   Assessment & Plan  Essential hypertension Uncontrolled, not at goal, no  med chage DASH diet and commitment to daily physical activity for a minimum of 30 minutes discussed and encouraged, as a part of hypertension management. The importance of attaining a healthy weight is also discussed.  BP/Weight 08/23/2020 04/07/2020 02/08/2020 11/23/2019 09/22/2019 07/24/2019 07/02/8117  Systolic BP 147 829 562 130 865 784 696  Diastolic BP 83 84 98 86 80 104 84  Wt. (Lbs) 180 - 178.04 175.12 176 - 187  BMI 32.92 - 32.56 32.03 32.19 - 34.2       Hyperlipidemia Hyperlipidemia:Low fat diet discussed and encouraged.   Lipid Panel  Lab Results  Component Value Date   CHOL 230 (H) 04/04/2020   HDL 71 04/04/2020   LDLCALC 144 (H) 04/04/2020   TRIG 89 04/04/2020   CHOLHDL 3.2 04/04/2020     Needs to lower fat in diet  Obesity (BMI 30.0-34.9)  Patient re-educated about  the importance of commitment to a  minimum of 150 minutes of exercise per week as able.  The importance of healthy food choices with portion control discussed, as well as eating regularly and within a 12 hour window most days. The need to choose "clean , green" food 50 to 75% of the time is discussed, as well as to make water the primary drink and set a goal of 64 ounces water daily.    Weight /BMI 08/23/2020 02/08/2020 11/23/2019  WEIGHT 180 lb 178 lb 0.6 oz 175 lb 1.9 oz  HEIGHT 5\' 2"  5\' 2"  5\' 2"   BMI 32.92 kg/m2 32.56 kg/m2 32.03 kg/m2  Prediabetes Patient educated about the importance of limiting  Carbohydrate intake , the need to commit to daily physical activity for a minimum of 30 minutes , and to commit weight loss. The fact that changes in all these areas will reduce or eliminate all together the development of diabetes is stressed.  Needs to reduce sweets and carbs, has worsened  Diabetic Labs Latest Ref Rng & Units 08/19/2020 04/04/2020 02/08/2020 06/26/2019 01/16/2019  HbA1c 4.8 - 5.6 % 6.4(H) - 6.2(A) 6.4(H) 6.5(H)  Chol 100 - 199 mg/dL - 230(H) - 201(H) 197  HDL >39 mg/dL - 71 -  66 64  Calc LDL 0 - 99 mg/dL - 144(H) - 118(H) 117(H)  Triglycerides 0 - 149 mg/dL - 89 - 74 66  Creatinine 0.57 - 1.00 mg/dL 0.76 0.76 - 0.70 0.62   BP/Weight 08/23/2020 04/07/2020 02/08/2020 11/23/2019 09/22/2019 07/24/2019 05/07/9415  Systolic BP 408 144 818 563 149 702 637  Diastolic BP 83 84 98 86 80 104 84  Wt. (Lbs) 180 - 178.04 175.12 176 - 187  BMI 32.92 - 32.56 32.03 32.19 - 34.2   No flowsheet data found.    Insomnia Sleep hygiene reviewed and written information offered also. Prescription sent for  medication needed.

## 2020-08-28 NOTE — Assessment & Plan Note (Signed)
Hyperlipidemia:Low fat diet discussed and encouraged.   Lipid Panel  Lab Results  Component Value Date   CHOL 230 (H) 04/04/2020   HDL 71 04/04/2020   LDLCALC 144 (H) 04/04/2020   TRIG 89 04/04/2020   CHOLHDL 3.2 04/04/2020     Needs to lower fat in diet

## 2020-08-28 NOTE — Assessment & Plan Note (Signed)
Sleep hygiene reviewed and written information offered also. Prescription sent for  medication needed.  

## 2020-08-28 NOTE — Assessment & Plan Note (Signed)
  Patient re-educated about  the importance of commitment to a  minimum of 150 minutes of exercise per week as able.  The importance of healthy food choices with portion control discussed, as well as eating regularly and within a 12 hour window most days. The need to choose "clean , green" food 50 to 75% of the time is discussed, as well as to make water the primary drink and set a goal of 64 ounces water daily.    Weight /BMI 08/23/2020 02/08/2020 11/23/2019  WEIGHT 180 lb 178 lb 0.6 oz 175 lb 1.9 oz  HEIGHT 5\' 2"  5\' 2"  5\' 2"   BMI 32.92 kg/m2 32.56 kg/m2 32.03 kg/m2

## 2020-09-04 ENCOUNTER — Other Ambulatory Visit: Payer: Self-pay | Admitting: Family Medicine

## 2020-10-11 ENCOUNTER — Other Ambulatory Visit: Payer: Self-pay

## 2020-10-11 ENCOUNTER — Ambulatory Visit: Payer: BC Managed Care – PPO | Admitting: Family Medicine

## 2020-10-11 ENCOUNTER — Encounter: Payer: Self-pay | Admitting: Family Medicine

## 2020-10-11 VITALS — BP 136/87 | HR 84 | Temp 99.1°F | Resp 18 | Ht 62.0 in | Wt 181.0 lb

## 2020-10-11 DIAGNOSIS — G8929 Other chronic pain: Secondary | ICD-10-CM

## 2020-10-11 DIAGNOSIS — I10 Essential (primary) hypertension: Secondary | ICD-10-CM | POA: Diagnosis not present

## 2020-10-11 DIAGNOSIS — M7918 Myalgia, other site: Secondary | ICD-10-CM | POA: Diagnosis not present

## 2020-10-11 DIAGNOSIS — M25562 Pain in left knee: Secondary | ICD-10-CM

## 2020-10-11 MED ORDER — PREDNISONE 5 MG (21) PO TBPK: 5.0000 mg | ORAL_TABLET | ORAL | 0 refills | Status: DC

## 2020-10-11 MED ORDER — GABAPENTIN 100 MG PO CAPS: 100.0000 mg | ORAL_CAPSULE | Freq: Every day | ORAL | 3 refills | Status: DC

## 2020-10-11 MED ORDER — KETOROLAC TROMETHAMINE 60 MG/2ML IM SOLN
60.0000 mg | Freq: Once | INTRAMUSCULAR | Status: AC
  Administered 2020-10-11: 60 mg via INTRAMUSCULAR

## 2020-10-11 MED ORDER — METHYLPREDNISOLONE ACETATE 80 MG/ML IJ SUSP
80.0000 mg | Freq: Once | INTRAMUSCULAR | Status: AC
  Administered 2020-10-11: 80 mg via INTRAMUSCULAR

## 2020-10-11 NOTE — Assessment & Plan Note (Signed)
Controlled, no change in medication DASH diet and commitment to daily physical activity for a minimum of 30 minutes discussed and encouraged, as a part of hypertension management. The importance of attaining a healthy weight is also discussed.  BP/Weight 10/11/2020 08/23/2020 04/07/2020 02/08/2020 11/23/2019 09/22/2019 5/88/5027  Systolic BP 741 287 867 672 094 709 628  Diastolic BP 87 83 84 98 86 80 104  Wt. (Lbs) 181 180 - 178.04 175.12 176 -  BMI 33.11 32.92 - 32.56 32.03 32.19 -

## 2020-10-11 NOTE — Assessment & Plan Note (Signed)
Uncontrolled.Toradol and depo medrol administered IM in the office , to be followed by a short course of oral prednisone   

## 2020-10-11 NOTE — Progress Notes (Signed)
   Paula Best     MRN: [id number]      DOB: [birth year]   HPI [name] is here with a 1 week h/o burning right buttock pain radiating to right groin, no inciting trauma , no new weakness, numbness or incontinence C/o increased left knee pain x 1 week also  ROS Denies recent fever or chills. Denies sinus pressure, nasal congestion, ear pain or sore throat. Denies chest congestion, productive cough or wheezing. Denies chest pains, palpitations and leg swelling Denies abdominal pain, nausea, vomiting,diarrhea or constipation.   Denies dysuria, frequency, hesitancy or incontinence. . Denies headaches, seizures, numbness, or tingling. Denies depression, anxiety or insomnia. Denies skin break down or rash.   PE  BP 136/87 (BP Location: Right Arm, Patient Position: Sitting, Cuff Size: Large)   Pulse 84   Temp 99.1 F (37.3 C)   Resp 18   Ht 5\' 2"  (1.575 m)   Wt 181 lb (82.1 kg)   LMP 07/25/2011   SpO2 94%   BMI 33.11 kg/m   Patient alert and oriented and in no cardiopulmonary distress.  HEENT: No facial asymmetry, EOMI,     Neck supple .  Chest: Clear to auscultation bilaterally.  CVS: S1, S2 no murmurs, no S3.Regular rate.  ABD: Soft non tender.   Ext: No edema  MS: decreased  ROM lumbar spine, and left knee Skin: Intact, no ulcerations or rash noted.  Psych: Good eye contact, normal affect. Memory intact not anxious or depressed appearing.  CNS: CN 2-12 intact, power,  normal throughout.no focal deficits noted.   Assessment & Plan  Right buttock pain Uncontrolled.Toradol and depo medrol administered IM in the office , to be followed by a short course of oral prednisone    Knee pain, left Uncontrolled.Toradol and depo medrol administered IM in the office , to be followed by a short course of oral prednisone  Essential hypertension Controlled, no change in medication DASH diet and commitment to daily physical activity for a minimum of 30 minutes  discussed and encouraged, as a part of hypertension management. The importance of attaining a healthy weight is also discussed.  BP/Weight 10/11/2020 08/23/2020 04/07/2020 02/08/2020 11/23/2019 09/22/2019 08/30/3014  Systolic BP 010 932 355 732 202 542 706  Diastolic BP 87 83 84 98 86 80 104  Wt. (Lbs) 181 180 - 178.04 175.12 176 -  BMI 33.11 32.92 - 32.56 32.03 32.19 -

## 2020-10-11 NOTE — Assessment & Plan Note (Addendum)
Uncontrolled.Toradol and depo medrol administered IM in the office , to be followed by a short course of oral prednisone  Start bed time gabapentin

## 2020-10-11 NOTE — Patient Instructions (Signed)
F/U as before, call if you need me sooner  Toradol 60 mg IM in office and depo medrol 80 mg IM in office for back pain and 6 day course of prednisone is  prescribed  New is gabapentin 100 mg at bedtime for arthritis and nerve pain  Thanks for choosing [facility], we consider it a privelige to serve you.   Pls get covid booster, past due

## 2020-11-07 ENCOUNTER — Other Ambulatory Visit: Payer: Self-pay | Admitting: Family Medicine

## 2020-12-22 ENCOUNTER — Telehealth: Payer: Self-pay

## 2020-12-22 NOTE — Telephone Encounter (Signed)
CPE Form  Copied Sleeved Noted

## 2021-01-12 ENCOUNTER — Other Ambulatory Visit: Payer: Self-pay

## 2021-01-12 ENCOUNTER — Other Ambulatory Visit (HOSPITAL_COMMUNITY)
Admission: RE | Admit: 2021-01-12 | Discharge: 2021-01-12 | Disposition: A | Discharge status: Home / Self Care | Payer: BC Managed Care – PPO | Source: Ambulatory Visit | Attending: Family Medicine | Admitting: Family Medicine

## 2021-01-12 ENCOUNTER — Ambulatory Visit (INDEPENDENT_AMBULATORY_CARE_PROVIDER_SITE_OTHER): Payer: BC Managed Care – PPO | Admitting: Family Medicine

## 2021-01-12 VITALS — BP 136/88 | HR 91 | Resp 16 | Ht 62.0 in | Wt 179.0 lb

## 2021-01-12 DIAGNOSIS — Z0001 Encounter for general adult medical examination with abnormal findings: Secondary | ICD-10-CM

## 2021-01-12 DIAGNOSIS — Z23 Encounter for immunization: Secondary | ICD-10-CM

## 2021-01-12 DIAGNOSIS — Z124 Encounter for screening for malignant neoplasm of cervix: Secondary | ICD-10-CM | POA: Diagnosis not present

## 2021-01-12 DIAGNOSIS — Z1211 Encounter for screening for malignant neoplasm of colon: Secondary | ICD-10-CM

## 2021-01-12 DIAGNOSIS — Z0000 Encounter for general adult medical examination without abnormal findings: Secondary | ICD-10-CM

## 2021-01-12 MED ORDER — TRIAMTERENE-HCTZ 37.5-25 MG PO TABS: 1.0000 | ORAL_TABLET | Freq: Every day | ORAL | 3 refills | Status: DC

## 2021-01-12 MED ORDER — AMLODIPINE BESYLATE 10 MG PO TABS: 10.0000 mg | ORAL_TABLET | Freq: Every day | ORAL | 3 refills | Status: DC

## 2021-01-12 NOTE — Patient Instructions (Addendum)
F/U in 5 months, call if you need me before  Shingrix #1 in November and #2 in January or February  Flu and pneumonia 20 vaccines today and tylenol 500 mg and water  NEED fasting labs as soon as possible ( already ordered)  You are referred for colonoscopy  Please get covid vaccine at your pharmacy  next 1 tio 2 weeks  It is important that you exercise regularly at least 30 minutes 5 times a week. If you develop chest pain, have severe difficulty breathing, or feel very tired, stop exercising immediately and seek medical attention  Think about what you will eat, plan ahead. Choose " clean, green, fresh or frozen" over canned, processed or packaged foods which are more sugary, salty and fatty. 70 to 75% of food eaten should be vegetables and fruit. Three meals at set times with snacks allowed between meals, but they must be fruit or vegetables. Aim to eat over a 12 hour period , example 7 am to 7 pm, and STOP after  your last meal of the day. Drink water,generally about 64 ounces per day, no other drink is as healthy. Fruit juice is best enjoyed in a healthy way, by EATING the fruit.   Thanks for choosing [facility], we consider it a privelige to serve you.

## 2021-01-14 ENCOUNTER — Encounter: Payer: Self-pay | Admitting: Family Medicine

## 2021-01-14 NOTE — Assessment & Plan Note (Signed)

## 2021-01-14 NOTE — Progress Notes (Signed)
    Paula Best     MRN: [id number]      DOB: 1955/09/16  HPI: Patient is in for annual physical exam. No other health concerns are expressed or addressed at the visit. Recent labs,  are reviewed. Immunization is reviewed , and  updated if needed.   PE: BP 136/88   Pulse 91   Resp 16   Ht 5\' 2"  (1.575 m)   Wt 179 lb (81.2 kg)   LMP 07/25/2011   SpO2 94%   BMI 32.74 kg/m    Pleasant  female, alert and oriented x 3, in no cardio-pulmonary distress. Afebrile. HEENT No facial trauma or asymetry. Sinuses non tender.  Extra occullar muscles intact.. External ears normal, . Neck: supple, no adenopathy,JVD or thyromegaly.No bruits.  Chest: Clear to ascultation bilaterally.No crackles or wheezes. Non tender to palpation  Breast: No asymetry,no masses or lumps. No tenderness. No nipple discharge or inversion. No axillary or supraclavicular adenopathy  Cardiovascular system; Heart sounds normal,  S1 and  S2 ,no S3.  No murmur, or thrill. Apical beat not displaced Peripheral pulses normal.  Abdomen: Soft, non tender, no organomegaly or masses. No bruits. Bowel sounds normal. No guarding, tenderness or rebound.   GU: External genitalia normal female genitalia , normal female distribution of hair. No lesions. Urethral meatus normal in size, no  Prolapse, no lesions visibly  Present. Bladder non tender. Vagina pink and moist , with no visible lesions , discharge present . Adequate pelvic support no  cystocele or rectocele noted Cervix pink and appears healthy, no lesions or ulcerations noted, no discharge noted from os Uterus normal size, no adnexal masses, no cervical motion or adnexal tenderness.   Musculoskeletal exam: Full ROM of spine, hips , shoulders and knees. No deformity ,swelling or crepitus noted. No muscle wasting or atrophy.   Neurologic: Cranial nerves 2 to 12 intact. Power, tone ,sensation and reflexes normal throughout. No disturbance in  gait. No tremor.  Skin: Intact, no ulceration, erythema , scaling or rash noted. Lipoma on mid back Pigmentation normal throughout  Psych; Normal mood and affect. Judgement and concentration normal   Assessment & Plan:  Annual physical exam Annual exam as documented. Counseling done  re healthy lifestyle involving commitment to 150 minutes exercise per week, heart healthy diet, and attaining healthy weight.The importance of adequate sleep also discussed. Regular seat belt use and home safety, is also discussed. Changes in health habits are decided on by the patient with goals and time frames  set for achieving them. Immunization and cancer screening needs are specifically addressed at this visit.

## 2021-01-17 ENCOUNTER — Encounter: Payer: Self-pay | Admitting: *Deleted

## 2021-01-17 LAB — CYTOLOGY - PAP
Comment: NEGATIVE
Diagnosis: NEGATIVE
High risk HPV: NEGATIVE

## 2021-02-09 ENCOUNTER — Ambulatory Visit (INDEPENDENT_AMBULATORY_CARE_PROVIDER_SITE_OTHER): Payer: BC Managed Care – PPO

## 2021-02-09 ENCOUNTER — Other Ambulatory Visit: Payer: Self-pay

## 2021-02-09 DIAGNOSIS — Z23 Encounter for immunization: Secondary | ICD-10-CM

## 2021-02-14 ENCOUNTER — Other Ambulatory Visit (HOSPITAL_COMMUNITY): Payer: Self-pay | Admitting: Radiology

## 2021-02-22 ENCOUNTER — Ambulatory Visit: Payer: BC Managed Care – PPO

## 2021-02-23 ENCOUNTER — Ambulatory Visit (INDEPENDENT_AMBULATORY_CARE_PROVIDER_SITE_OTHER): Payer: Self-pay | Admitting: *Deleted

## 2021-02-23 ENCOUNTER — Other Ambulatory Visit: Payer: Self-pay

## 2021-02-23 VITALS — Ht 62.0 in | Wt 178.2 lb

## 2021-02-23 DIAGNOSIS — Z1211 Encounter for screening for malignant neoplasm of colon: Secondary | ICD-10-CM

## 2021-02-23 MED ORDER — NA SULFATE-K SULFATE-MG SULF 17.5-3.13-1.6 GM/177ML PO SOLN: 1.0000 | Freq: Once | ORAL | 0 refills | Status: AC

## 2021-02-23 NOTE — Progress Notes (Addendum)
Gastroenterology Pre-Procedure Review  Request Date: 02/23/2021 Requesting Physician: Dr. Tula Nakayama, 10 year recall (overdue), Last TCS done 07/04/2009 by Dr. Oneida Alar, normal colon, internal hemorrhoids  PATIENT REVIEW QUESTIONS: The patient responded to the following health history questions as indicated:    1. Diabetes Melitis: no 2. Joint replacements in the past 12 months: no 3. Major health problems in the past 3 months: no 4. Has an artificial valve or MVP: no 5. Has a defibrillator: no 6. Has been advised in past to take antibiotics in advance of a procedure like teeth cleaning: no 7. Family history of colon cancer: no  8. Alcohol Use: no 9. Illicit drug Use: no 10. History of sleep apnea: no  11. History of coronary artery or other vascular stents placed within the last 12 months: no 12. History of any prior anesthesia complications: no 13. Body mass index is 32.59 kg/m.    MEDICATIONS & ALLERGIES:    Patient reports the following regarding taking any blood thinners:   Plavix? no Aspirin? 81 mg as needed Coumadin? no Brilinta? no Xarelto? no Eliquis? no Pradaxa? no Savaysa? no Effient? no  Patient confirms/reports the following medications:  Current Outpatient Medications  Medication Sig Dispense Refill   amLODipine (NORVASC) 10 MG tablet Take 1 tablet (10 mg total) by mouth daily. 90 tablet 3   gabapentin (NEURONTIN) 100 MG capsule Take 1 capsule (100 mg total) by mouth at bedtime. (Patient taking differently: Take 100 mg by mouth as needed.) 30 capsule 3   triamterene-hydrochlorothiazide (MAXZIDE-25) 37.5-25 MG tablet Take 1 tablet by mouth daily. 90 tablet 3   No current facility-administered medications for this visit.    Patient confirms/reports the following allergies:  No Known Allergies  No orders of the defined types were placed in this encounter.   AUTHORIZATION INFORMATION Primary Insurance: BCBS,  [place] #: [id number],  Group #:  [id number] Pre-Cert / Auth required: No, file to local BCBS plan  Secondary Insurance: Medicare Part A,  ID #: [id number] Pre-Cert / Auth required: No, not required  SCHEDULE INFORMATION: Procedure has been scheduled as follows:  Date: 04/04/2021, Time: 1:30 Location: [facility] with Dr. Abbey Chatters  This Gastroenterology Pre-Precedure Review Form is being routed to the following provider(s): Aliene Altes, PA-C

## 2021-02-23 NOTE — Progress Notes (Signed)
Pt says she is supposed to be getting Medicare part A but hasn't received a card yet.  Will double check with her on this when I call her back to schedule procedure.

## 2021-02-23 NOTE — Progress Notes (Signed)
Pt requested Jan procedure.  Will call her once available.

## 2021-02-24 NOTE — Progress Notes (Signed)
OK to schedule. ASA II.   She will need BMP at pre-op.

## 2021-02-26 ENCOUNTER — Ambulatory Visit
Admission: EM | Admit: 2021-02-26 | Discharge: 2021-02-26 | Disposition: A | Discharge status: Home / Self Care | Payer: BC Managed Care – PPO | Attending: Family Medicine | Admitting: Family Medicine

## 2021-02-26 ENCOUNTER — Other Ambulatory Visit: Payer: Self-pay

## 2021-02-26 DIAGNOSIS — L03312 Cellulitis of back [any part except buttock]: Secondary | ICD-10-CM

## 2021-02-26 MED ORDER — MUPIROCIN 2 % EX OINT: 1.0000 "application " | TOPICAL_OINTMENT | Freq: Two times a day (BID) | CUTANEOUS | 0 refills | Status: DC

## 2021-02-26 MED ORDER — CEPHALEXIN 500 MG PO CAPS: 500.0000 mg | ORAL_CAPSULE | Freq: Two times a day (BID) | ORAL | 0 refills | Status: DC

## 2021-02-26 NOTE — ED Triage Notes (Signed)
Patient states she has a rash in the middle  back, where her bra connects.  She states it itches and she tried some Benadryl cream and it helped but the rash wont go away.   She thinks because she changed from All Detergent with no fragrance but she switched to All with fragrance.

## 2021-02-26 NOTE — ED Provider Notes (Signed)
[facility]    CSN: [id number] Arrival date & time: 02/26/21  0805      History   Chief Complaint No chief complaint on file.   HPI Paula Best is a 65 y.o. female.   Presenting today with an irritated itchy rash in the mid upper back that she first noticed several days ago.  She states a friend of hers has been checking in on it, applying Benadryl cream without any benefit.  Denies drainage from the area, pain, fever, chills, spread of rash.  Has changed her detergent from an unscented to a fragranced detergent but no rashes elsewhere.  No injury to the area.  Past Medical History:  Diagnosis Date   Anemia, iron deficiency    Asthma    Heart murmur, systolic    Hypertension    Lipoma    LVH (left ventricular hypertrophy)    mild- Echo 3/11   Menorrhagia    Obesity    Tobacco user    Vitamin D deficiency     Patient Active Problem List   Diagnosis Date Noted   Insomnia 04/07/2020   Knee pain, bilateral 11/23/2019   Hyperlipidemia 06/24/2019   Reduced vision 09/14/2018   Endometrial polyp 02/26/2018   Annual physical exam 01/31/2016   Obesity (BMI 30.0-34.9) 11/02/2013   Abnormal endometrial ultrasound 11/02/2013   Ovarian cyst, needs rept [place] in8/2014 11/21/2011   Cervical neck pain with evidence of disc disease 10/24/2011   Lumbar pain with radiation down both legs 10/24/2011   Prediabetes 09/02/2011   Carotid bruit 05/22/2011   Vitamin D deficiency 06/30/2009   HEART MURMUR, SYSTOLIC 89/21/1941   Iron deficiency anemia, unspecified 09/12/2007   Asthma, mild intermittent 09/12/2007   LIPOMA 08/29/2007   Essential hypertension 08/29/2007    Past Surgical History:  Procedure Laterality Date   APPENDECTOMY     DENTAL SURGERY     LIPOMA EXCISION  May 29, Dr Arnoldo Morale   TUBAL LIGATION  26    OB History   No obstetric history on file.      Home Medications    Prior to Admission medications   Medication Sig Start Date End Date Taking?  Authorizing Provider  cephALEXin (KEFLEX) 500 MG capsule Take 1 capsule (500 mg total) by mouth 2 (two) times daily. 02/26/21  Yes Volney American, PA-C  mupirocin ointment (BACTROBAN) 2 % Apply 1 application topically 2 (two) times daily. 02/26/21  Yes Volney American, PA-C  acetaminophen (TYLENOL) 650 MG CR tablet Take 650 mg by mouth as needed for pain.    [provider]  amLODipine (NORVASC) 10 MG tablet Take 1 tablet (10 mg total) by mouth daily. 01/12/21   Fayrene Helper, MD  aspirin 81 MG chewable tablet Chew by mouth as needed.    [provider]  gabapentin (NEURONTIN) 100 MG capsule Take 1 capsule (100 mg total) by mouth at bedtime. Patient taking differently: Take 100 mg by mouth as needed. 10/11/20   Fayrene Helper, MD  Multiple Vitamins-Minerals (EMERGEN-C IMMUNE PLUS) PACK Take by mouth as needed.    [provider]  triamterene-hydrochlorothiazide (MAXZIDE-25) 37.5-25 MG tablet Take 1 tablet by mouth daily. 01/12/21   Fayrene Helper, MD    Family History Family History  Problem Relation Age of Onset   Kidney disease Mother    Hypertension Brother    Asthma Sister     Social History Social History   Tobacco Use   Smoking status: Former  Packs/day: 0.50    Types: Cigarettes    Quit date: 12/31/2017    Years since quitting: 3.1   Smokeless tobacco: Never  Substance Use Topics   Alcohol use: No    Alcohol/week: 0.0 standard drinks   Drug use: No     Allergies   Patient has no known allergies.   Review of Systems Review of Systems Per HPI  Physical Exam Triage Vital Signs ED Triage Vitals  Enc Vitals Group     BP 02/26/21 0816 132/83     Pulse Rate 02/26/21 0816 84     Resp 02/26/21 0816 18     Temp 02/26/21 0816 98.2 F (36.8 C)     Temp Source 02/26/21 0816 Oral     SpO2 02/26/21 0816 94 %     Weight --      Height --      Head Circumference --      Peak Flow --      Pain Score 02/26/21 0838  0     Pain Loc --      Pain Edu? --      Excl. in [place]? --    No data found.  Updated Vital Signs BP 132/83 (BP Location: Right Arm)   Pulse 84   Temp 98.2 F (36.8 C) (Oral)   Resp 18   LMP 07/25/2011   SpO2 94%   Visual Acuity Right Eye Distance:   Left Eye Distance:   Bilateral Distance:    Right Eye Near:   Left Eye Near:    Bilateral Near:     Physical Exam Vitals and nursing note reviewed.  Constitutional:      Appearance: Normal appearance. She is not ill-appearing.  HENT:     Head: Atraumatic.  Eyes:     Extraocular Movements: Extraocular movements intact.     Conjunctiva/sclera: Conjunctivae normal.  Cardiovascular:     Rate and Rhythm: Normal rate and regular rhythm.     Heart sounds: Normal heart sounds.  Pulmonary:     Effort: Pulmonary effort is normal.     Breath sounds: Normal breath sounds.  Musculoskeletal:        General: Normal range of motion.     Cervical back: Normal range of motion and neck supple.  Skin:    General: Skin is warm and dry.     Findings: Erythema and rash present.     Comments: Large area of lipoma on right upper back just offset from midline which patient states is chronic.  The area of erythema, peeling and ulceration extends onto lipoma area but starts in the midline.  No significant fluctuance or drainage noted but there is a raised central area forming at midline.  Neurological:     Mental Status: She is alert and oriented to person, place, and time.  Psychiatric:        Mood and Affect: Mood normal.        Thought Content: Thought content normal.        Judgment: Judgment normal.     UC Treatments / Results  Labs (all labs ordered are listed, but only abnormal results are displayed) Labs Reviewed - No data to display  EKG   Radiology No results found.  Procedures Procedures (including critical care time)  Medications Ordered in UC Medications - No data to display  Initial Impression / Assessment and  Plan / UC Course  I have reviewed the triage vital signs and the nursing notes.  Pertinent labs & imaging results that were available during my care of the patient were reviewed by me and considered in my medical decision making (see chart for details).     Unclear etiology of rash, whether yeast, allergic but now appears to have a superficial infection in the area.  Will cover for this with Keflex, mupirocin and to keep the area covered.  Home wound care and return precautions reviewed.  Follow with PCP next week for recheck.  Final Clinical Impressions(s) / UC Diagnoses   Final diagnoses:  Cellulitis of back except buttock   Discharge Instructions   None    ED Prescriptions     Medication Sig Dispense Auth. Provider   mupirocin ointment (BACTROBAN) 2 % Apply 1 application topically 2 (two) times daily. 22 g Volney American, [place]   cephALEXin (KEFLEX) 500 MG capsule Take 1 capsule (500 mg total) by mouth 2 (two) times daily. 14 capsule Volney American, [place]      PDMP not reviewed this encounter.   Volney American, [place] 02/26/21 1023

## 2021-03-01 ENCOUNTER — Other Ambulatory Visit: Payer: Self-pay

## 2021-03-01 ENCOUNTER — Ambulatory Visit (HOSPITAL_COMMUNITY)
Admission: RE | Admit: 2021-03-01 | Discharge: 2021-03-01 | Disposition: A | Discharge status: Home / Self Care | Payer: BC Managed Care – PPO | Source: Ambulatory Visit | Attending: Family Medicine | Admitting: Family Medicine

## 2021-03-01 DIAGNOSIS — Z1231 Encounter for screening mammogram for malignant neoplasm of breast: Secondary | ICD-10-CM | POA: Diagnosis not present

## 2021-03-09 NOTE — Progress Notes (Signed)
Lmom for pt to call me back. 

## 2021-03-10 ENCOUNTER — Encounter: Payer: Self-pay | Admitting: *Deleted

## 2021-03-10 ENCOUNTER — Other Ambulatory Visit: Payer: Self-pay | Admitting: *Deleted

## 2021-03-10 DIAGNOSIS — Z1211 Encounter for screening for malignant neoplasm of colon: Secondary | ICD-10-CM

## 2021-03-10 NOTE — Progress Notes (Signed)
Spoke to pt.  Scheduled procedure for 04/04/2021 with arrival at 12:00.  Pt made aware to have labs drawn on 03/31/2021.  Pt did receive Medicare card.  Added it in system.  Pt aware that I am mailing out prep instructions.

## 2021-03-12 ENCOUNTER — Other Ambulatory Visit: Payer: Self-pay | Admitting: Family Medicine

## 2021-03-15 ENCOUNTER — Other Ambulatory Visit: Payer: Self-pay | Admitting: *Deleted

## 2021-04-03 ENCOUNTER — Other Ambulatory Visit (HOSPITAL_COMMUNITY)
Admission: RE | Admit: 2021-04-03 | Discharge: 2021-04-03 | Disposition: A | Discharge status: Home / Self Care | Payer: BC Managed Care – PPO | Source: Ambulatory Visit | Attending: Internal Medicine | Admitting: Internal Medicine

## 2021-04-03 ENCOUNTER — Other Ambulatory Visit: Payer: Self-pay

## 2021-04-03 DIAGNOSIS — J45909 Unspecified asthma, uncomplicated: Secondary | ICD-10-CM | POA: Diagnosis not present

## 2021-04-03 DIAGNOSIS — Z1211 Encounter for screening for malignant neoplasm of colon: Secondary | ICD-10-CM | POA: Insufficient documentation

## 2021-04-03 DIAGNOSIS — Z87891 Personal history of nicotine dependence: Secondary | ICD-10-CM | POA: Diagnosis not present

## 2021-04-03 DIAGNOSIS — R011 Cardiac murmur, unspecified: Secondary | ICD-10-CM | POA: Diagnosis not present

## 2021-04-03 DIAGNOSIS — D124 Benign neoplasm of descending colon: Secondary | ICD-10-CM | POA: Diagnosis not present

## 2021-04-03 DIAGNOSIS — I10 Essential (primary) hypertension: Secondary | ICD-10-CM | POA: Diagnosis not present

## 2021-04-03 DIAGNOSIS — D649 Anemia, unspecified: Secondary | ICD-10-CM | POA: Diagnosis not present

## 2021-04-03 DIAGNOSIS — K648 Other hemorrhoids: Secondary | ICD-10-CM | POA: Diagnosis not present

## 2021-04-03 LAB — BASIC METABOLIC PANEL
Anion gap: 7 (ref 5–15)
BUN: 13 mg/dL (ref 8–23)
CO2: 29 mmol/L (ref 22–32)
Calcium: 9.4 mg/dL (ref 8.9–10.3)
Chloride: 103 mmol/L (ref 98–111)
Creatinine, Ser: 0.77 mg/dL (ref 0.44–1.00)
GFR, Estimated: >60 mL/min (ref >60)
Glucose, Bld: 107 mg/dL — ABNORMAL HIGH (ref 70–99)
Potassium: 3.4 mmol/L — ABNORMAL LOW (ref 3.5–5.1)
Sodium: 139 mmol/L (ref 135–145)

## 2021-04-04 ENCOUNTER — Ambulatory Visit (HOSPITAL_COMMUNITY): Payer: BC Managed Care – PPO | Admitting: Anesthesiology

## 2021-04-04 ENCOUNTER — Encounter (HOSPITAL_COMMUNITY): Payer: Self-pay

## 2021-04-04 ENCOUNTER — Encounter (HOSPITAL_COMMUNITY)
Admission: RE | Disposition: A | Discharge status: Home / Self Care | Payer: Self-pay | Source: Home / Self Care | Attending: Internal Medicine

## 2021-04-04 ENCOUNTER — Ambulatory Visit (HOSPITAL_COMMUNITY)
Admission: RE | Admit: 2021-04-04 | Discharge: 2021-04-04 | Disposition: A | Discharge status: Home / Self Care | Payer: BC Managed Care – PPO | Attending: Internal Medicine | Admitting: Internal Medicine

## 2021-04-04 ENCOUNTER — Other Ambulatory Visit: Payer: Self-pay

## 2021-04-04 DIAGNOSIS — Z1211 Encounter for screening for malignant neoplasm of colon: Secondary | ICD-10-CM | POA: Diagnosis not present

## 2021-04-04 DIAGNOSIS — J45909 Unspecified asthma, uncomplicated: Secondary | ICD-10-CM | POA: Insufficient documentation

## 2021-04-04 DIAGNOSIS — Z87891 Personal history of nicotine dependence: Secondary | ICD-10-CM | POA: Insufficient documentation

## 2021-04-04 DIAGNOSIS — D124 Benign neoplasm of descending colon: Secondary | ICD-10-CM | POA: Insufficient documentation

## 2021-04-04 DIAGNOSIS — D649 Anemia, unspecified: Secondary | ICD-10-CM | POA: Insufficient documentation

## 2021-04-04 DIAGNOSIS — K648 Other hemorrhoids: Secondary | ICD-10-CM | POA: Diagnosis not present

## 2021-04-04 DIAGNOSIS — R011 Cardiac murmur, unspecified: Secondary | ICD-10-CM | POA: Insufficient documentation

## 2021-04-04 DIAGNOSIS — I10 Essential (primary) hypertension: Secondary | ICD-10-CM | POA: Diagnosis not present

## 2021-04-04 DIAGNOSIS — K635 Polyp of colon: Secondary | ICD-10-CM

## 2021-04-04 DIAGNOSIS — Z139 Encounter for screening, unspecified: Secondary | ICD-10-CM | POA: Diagnosis not present

## 2021-04-04 DIAGNOSIS — J4520 Mild intermittent asthma, uncomplicated: Secondary | ICD-10-CM | POA: Diagnosis not present

## 2021-04-04 HISTORY — PX: COLONOSCOPY WITH PROPOFOL: SHX5780

## 2021-04-04 HISTORY — PX: POLYPECTOMY: SHX5525

## 2021-04-04 SURGERY — COLONOSCOPY WITH PROPOFOL
Anesthesia: General

## 2021-04-04 MED ORDER — LIDOCAINE HCL (CARDIAC) PF 100 MG/5ML IV SOSY
PREFILLED_SYRINGE | INTRAVENOUS | Status: DC | PRN
  Administered 2021-04-04: 60 mg via INTRAVENOUS

## 2021-04-04 MED ORDER — LACTATED RINGERS IV SOLN: INTRAVENOUS | Status: DC | PRN

## 2021-04-04 MED ORDER — LACTATED RINGERS IV SOLN: INTRAVENOUS | Status: DC

## 2021-04-04 MED ORDER — PROPOFOL 10 MG/ML IV BOLUS
INTRAVENOUS | Status: DC | PRN
Start: 2021-04-04 — End: 2021-04-04
  Administered 2021-04-04: 130 mg via INTRAVENOUS
  Administered 2021-04-04 (×2): 30 mg via INTRAVENOUS
  Administered 2021-04-04: 20 mg via INTRAVENOUS

## 2021-04-04 NOTE — H&P (Signed)
Primary Care Physician:  Fayrene Helper, MD Primary Gastroenterologist:  Dr. Abbey Chatters  Pre-Procedure History & Physical: HPI:  Paula Best is a 66 y.o. female is here for a colonoscopy for colon cancer screening purposes.  Last TCS done 07/04/2009 by Dr. Oneida Alar, normal colon, internal hemorrhoids  Past Medical History:  Diagnosis Date   Anemia, iron deficiency    Heart murmur, systolic    Hypertension    Lipoma    LVH (left ventricular hypertrophy)    mild- Echo 3/11   Menorrhagia    Obesity    Tobacco user    Vitamin D deficiency     Past Surgical History:  Procedure Laterality Date   APPENDECTOMY     DENTAL SURGERY     LIPOMA EXCISION  May 29, Dr Arnoldo Morale   TUBAL LIGATION  26    Prior to Admission medications   Medication Sig Start Date End Date Taking? Authorizing Provider  amLODipine (NORVASC) 10 MG tablet Take 1 tablet (10 mg total) by mouth daily. 01/12/21  Yes Fayrene Helper, MD  aspirin 81 MG chewable tablet Chew 81 mg by mouth as needed for mild pain.   Yes [provider]  gabapentin (NEURONTIN) 100 MG capsule Take 1 capsule (100 mg total) by mouth at bedtime. Patient taking differently: Take 100 mg by mouth at bedtime as needed (pain). 10/11/20  Yes Fayrene Helper, MD  Multiple Vitamins-Minerals (EMERGEN-C IMMUNE PLUS) PACK Take 1 packet by mouth as needed (immune support).   Yes [provider]  triamterene-hydrochlorothiazide (MAXZIDE-25) 37.5-25 MG tablet Take 1 tablet by mouth daily. 01/12/21  Yes Fayrene Helper, MD  acetaminophen (TYLENOL) 650 MG CR tablet Take 650 mg by mouth every 8 (eight) hours as needed for pain.    [provider]  cephALEXin (KEFLEX) 500 MG capsule Take 1 capsule (500 mg total) by mouth 2 (two) times daily. Patient not taking: Reported on 03/24/2021 02/26/21   Volney American, PA-C  hydrochlorothiazide (HYDRODIURIL) 25 MG tablet TAKE 1 TABLET(25 MG) BY MOUTH DAILY Patient not taking:  Reported on 03/24/2021 03/13/21   Fayrene Helper, MD  mupirocin ointment (BACTROBAN) 2 % Apply 1 application topically 2 (two) times daily. Patient not taking: Reported on 03/24/2021 02/26/21   Volney American, PA-C    Allergies as of 03/10/2021   (No Known Allergies)    Family History  Problem Relation Age of Onset   Kidney disease Mother    Hypertension Brother    Asthma Sister     Social History   Socioeconomic History   Marital status: Divorced    Spouse name: Not on file   Number of children: 3   Years of education: 11th    Highest education level: Not on file  Occupational History   Occupation: Advice worker for [employer]   Tobacco Use   Smoking status: Former    Packs/day: 0.50    Types: Cigarettes    Quit date: 12/31/2017    Years since quitting: 3.2   Smokeless tobacco: Never  Vaping Use   Vaping Use: Never used  Substance and Sexual Activity   Alcohol use: No    Alcohol/week: 0.0 standard drinks   Drug use: No   Sexual activity: Yes    Partners: Male  Other Topics Concern   Not on file  Social History Narrative   Not on file   Social Determinants of Health   Financial Resource Strain: Not on file  Food Insecurity: Not  on file  Transportation Needs: Not on file  Physical Activity: Not on file  Stress: Not on file  Social Connections: Not on file  Intimate Partner Violence: Not on file    Review of Systems: See HPI, otherwise negative ROS  Physical Exam: Vital signs in last 24 hours: Temp:  [98.4 F (36.9 C)] 98.4 F (36.9 C) (01/10 1204) Pulse Rate:  [85] 85 (01/10 1204) Resp:  [23] 23 (01/10 1204) BP: (140)/(84) 140/84 (01/10 1204) SpO2:  [96 %] 96 % (01/10 1204) Weight:  [79.4 kg] 79.4 kg (01/10 1204)   General:   Alert,  Well-developed, well-nourished, pleasant and cooperative in NAD Head:  Normocephalic and atraumatic. Eyes:  Sclera clear, no icterus.   Conjunctiva pink. Ears:  Normal auditory acuity. Nose:  No  deformity, discharge,  or lesions. Mouth:  No deformity or lesions, dentition normal. Neck:  Supple; no masses or thyromegaly. Lungs:  Clear throughout to auscultation.   No wheezes, crackles, or rhonchi. No acute distress. Heart:  Regular rate and rhythm; no murmurs, clicks, rubs,  or gallops. Abdomen:  Soft, nontender and nondistended. No masses, hepatosplenomegaly or hernias noted. Normal bowel sounds, without guarding, and without rebound.   Msk:  Symmetrical without gross deformities. Normal posture. Extremities:  Without clubbing or edema. Neurologic:  Alert and  oriented x4;  grossly normal neurologically. Skin:  Intact without significant lesions or rashes. Cervical Nodes:  No significant cervical adenopathy. Psych:  Alert and cooperative. Normal mood and affect.  Impression/Plan: Paula Best is here for a colonoscopy to be performed for colon cancer screening purposes.  The risks of the procedure including infection, bleed, or perforation as well as benefits, limitations, alternatives and imponderables have been reviewed with the patient. Questions have been answered. All parties agreeable.

## 2021-04-04 NOTE — Discharge Instructions (Addendum)
°  Colonoscopy Discharge Instructions  Read the instructions outlined below and refer to this sheet in the next few weeks. These discharge instructions provide you with general information on caring for yourself after you leave the hospital. Your doctor may also give you specific instructions. While your treatment has been planned according to the most current medical practices available, unavoidable complications occasionally occur.   ACTIVITY You may resume your regular activity, but move at a slower pace for the next 24 hours.  Take frequent rest periods for the next 24 hours.  Walking will help get rid of the air and reduce the bloated feeling in your belly (abdomen).  No driving for 24 hours (because of the medicine (anesthesia) used during the test).   Do not sign any important legal documents or operate any machinery for 24 hours (because of the anesthesia used during the test).  NUTRITION Drink plenty of fluids.  You may resume your normal diet as instructed by your doctor.  Begin with a light meal and progress to your normal diet. Heavy or fried foods are harder to digest and may make you feel sick to your stomach (nauseated).  Avoid alcoholic beverages for 24 hours or as instructed.  MEDICATIONS You may resume your normal medications unless your doctor tells you otherwise.  WHAT YOU CAN EXPECT TODAY Some feelings of bloating in the abdomen.  Passage of more gas than usual.  Spotting of blood in your stool or on the toilet paper.  IF YOU HAD POLYPS REMOVED DURING THE COLONOSCOPY: No aspirin products for 7 days or as instructed.  No alcohol for 7 days or as instructed.  Eat a soft diet for the next 24 hours.  FINDING OUT THE RESULTS OF YOUR TEST Not all test results are available during your visit. If your test results are not back during the visit, make an appointment with your caregiver to find out the results. Do not assume everything is normal if you have not heard from your  caregiver or the medical facility. It is important for you to follow up on all of your test results.  SEEK IMMEDIATE MEDICAL ATTENTION IF: You have more than a spotting of blood in your stool.  Your belly is swollen (abdominal distention).  You are nauseated or vomiting.  You have a temperature over 101.  You have abdominal pain or discomfort that is severe or gets worse throughout the day.   Your colonoscopy revealed 1 polyp(s) which I removed successfully. Await pathology results, my office will contact you. I recommend repeating colonoscopy in 5 years for surveillance purposes. Otherwise follow up with GI as needed.    I hope you have a great rest of your week!  Charles K. Carver, D.O. Gastroenterology and Hepatology [facility]  

## 2021-04-04 NOTE — Anesthesia Preprocedure Evaluation (Signed)
Anesthesia Evaluation  Patient identified by MRN, date of birth, ID band Patient awake    Reviewed: Allergy & Precautions, H&P , NPO status , Patient's Chart, lab work & pertinent test results, reviewed documented beta blocker date and time   Airway Mallampati: II  TM Distance: >3 FB Neck ROM: full    Dental no notable dental hx.    Pulmonary asthma , former smoker,    Pulmonary exam normal breath sounds clear to auscultation       Cardiovascular Exercise Tolerance: Good hypertension, + Valvular Problems/Murmurs (Murmur, but no valve abnormalities on Echo)  Rhythm:regular Rate:Normal     Neuro/Psych negative neurological ROS  negative psych ROS   GI/Hepatic negative GI ROS, Neg liver ROS,   Endo/Other  negative endocrine ROS  Renal/GU negative Renal ROS  negative genitourinary   Musculoskeletal   Abdominal   Peds  Hematology  (+) Blood dyscrasia, anemia ,   Anesthesia Other Findings   Reproductive/Obstetrics negative OB ROS                             Anesthesia Physical Anesthesia Plan  ASA: 3  Anesthesia Plan: General   Post-op Pain Management:    Induction:   PONV Risk Score and Plan: Propofol infusion  Airway Management Planned:   Additional Equipment:   Intra-op Plan:   Post-operative Plan:   Informed Consent: I have reviewed the patients History and Physical, chart, labs and discussed the procedure including the risks, benefits and alternatives for the proposed anesthesia with the patient or authorized representative who has indicated his/her understanding and acceptance.     Dental Advisory Given  Plan Discussed with: CRNA  Anesthesia Plan Comments:         Anesthesia Quick Evaluation

## 2021-04-04 NOTE — OR Nursing (Signed)
Paula Best was at [facility] on 04/04/21 for a procedure and cannot return to work until 2:00pm on 04/05/21.

## 2021-04-04 NOTE — Transfer of Care (Signed)
Immediate Anesthesia Transfer of Care Note  Patient: Paula Best  Procedure(s) Performed: COLONOSCOPY WITH PROPOFOL POLYPECTOMY  Patient Location: PACU  Anesthesia Type:General  Level of Consciousness: awake, alert  and oriented  Airway & Oxygen Therapy: Patient Spontanous Breathing  Post-op Assessment: Report given to RN and Post -op Vital signs reviewed and stable  Post vital signs: Reviewed and stable  Last Vitals:  Vitals Value Taken Time  BP    Temp    Pulse    Resp    SpO2      Last Pain:  Vitals:   04/04/21 1238  TempSrc:   PainSc: 0-No pain         Complications: No notable events documented.

## 2021-04-04 NOTE — Op Note (Signed)
[facility] Patient Name: Paula Best Procedure Date: 04/04/2021 12:38 PM MRN: [id number] Date of Birth: [birth year] Attending MD: Elon Alas. Edgar Frisk CSN: [id number] Age: 66 Admit Type: Outpatient Procedure:                Colonoscopy Indications:              Screening for colorectal malignant neoplasm Providers:                Elon Alas. Abbey Chatters, DO, Caprice Kluver, Raphael Gibney,                            Technician Referring MD:              Medicines:                See the Anesthesia note for documentation of the                            administered medications Complications:            No immediate complications. Estimated Blood Loss:     Estimated blood loss was minimal. Procedure:                Pre-Anesthesia Assessment:                           - The anesthesia plan was to use monitored                            anesthesia care (MAC).                           After obtaining informed consent, the colonoscope                            was passed under direct vision. Throughout the                            procedure, the patient's blood pressure, pulse, and                            oxygen saturations were monitored continuously. The                            [id number]-HQ190L ([id number]) scope was introduced through                            the anus and advanced to the the cecum, identified                            by appendiceal orifice and ileocecal valve. The                            colonoscopy was performed without difficulty. The                            patient tolerated the procedure well. The quality  of the bowel preparation was evaluated using the                            BBPS [facility] Bowel Preparation Scale) with scores                            of: Right Colon = 3, Transverse Colon = 3 and Left                            Colon = 3 (entire mucosa seen well with no residual                            staining, small  fragments of stool or opaque                            liquid). The total BBPS score equals 9. Scope In: 12:43:16 PM Scope Out: 12:58:07 PM Scope Withdrawal Time: 0 hours 12 minutes 43 seconds  Total Procedure Duration: 0 hours 14 minutes 51 seconds  Findings:      The perianal and digital rectal examinations were normal.      Non-bleeding internal hemorrhoids were found during endoscopy.      A 10 mm polyp was found in the descending colon. The polyp was sessile.       The polyp was removed with a cold snare. Resection and retrieval were       complete.      The exam was otherwise without abnormality. Impression:               - Non-bleeding internal hemorrhoids.                           - One 10 mm polyp in the descending colon, removed                            with a cold snare. Resected and retrieved.                           - The examination was otherwise normal. Moderate Sedation:      Per Anesthesia Care Recommendation:           - Patient has a contact number available for                            emergencies. The signs and symptoms of potential                            delayed complications were discussed with the                            patient. Return to normal activities tomorrow.                            Written discharge instructions were provided to the  patient.                           - Resume previous diet.                           - Continue present medications.                           - Await pathology results.                           - Repeat colonoscopy in 5 years for surveillance.                           - Return to GI clinic PRN. Procedure Code(s):        --- Professional ---                           [phone], Colonoscopy, flexible; with removal of                            tumor(s), polyp(s), or other lesion(s) by snare                            technique Diagnosis Code(s):        --- Professional ---                            Z12.11, Encounter for screening for malignant                            neoplasm of colon                           K63.5, Polyp of colon                           K64.8, Other hemorrhoids CPT copyright 2019 American Medical Association. All rights reserved. The codes documented in this report are preliminary and upon coder review may  be revised to meet current compliance requirements. Elon Alas. Abbey Chatters, DO [place] Abdalla Naramore, DO 04/04/2021 1:00:11 PM This report has been signed electronically. Number of Addenda: 0

## 2021-04-05 LAB — SURGICAL PATHOLOGY

## 2021-04-05 NOTE — Anesthesia Postprocedure Evaluation (Signed)
Anesthesia Post Note  Patient: Paula Best  Procedure(s) Performed: COLONOSCOPY WITH PROPOFOL POLYPECTOMY  Patient location during evaluation: Phase II Anesthesia Type: General Level of consciousness: awake Pain management: pain level controlled Vital Signs Assessment: post-procedure vital signs reviewed and stable Respiratory status: spontaneous breathing and respiratory function stable Cardiovascular status: blood pressure returned to baseline and stable Postop Assessment: no headache and no apparent nausea or vomiting Anesthetic complications: no Comments: Late entry   No notable events documented.   Last Vitals:  Vitals:   04/04/21 1204 04/04/21 1300  BP: 140/84 125/80  Pulse: 85   Resp: (!) 23 (!) 24  Temp: 36.9 C 36.9 C  SpO2: 96% 94%    Last Pain:  Vitals:   04/04/21 1300  TempSrc: Oral  PainSc: 0-No pain                 Louann Sjogren

## 2021-04-07 ENCOUNTER — Encounter (HOSPITAL_COMMUNITY): Payer: Self-pay | Admitting: Internal Medicine

## 2021-04-27 ENCOUNTER — Ambulatory Visit (INDEPENDENT_AMBULATORY_CARE_PROVIDER_SITE_OTHER): Payer: BC Managed Care – PPO

## 2021-04-27 ENCOUNTER — Other Ambulatory Visit: Payer: Self-pay

## 2021-04-27 DIAGNOSIS — Z23 Encounter for immunization: Secondary | ICD-10-CM

## 2021-06-14 ENCOUNTER — Ambulatory Visit: Payer: BC Managed Care – PPO | Admitting: Family Medicine

## 2021-06-14 ENCOUNTER — Encounter: Payer: Self-pay | Admitting: Family Medicine

## 2021-06-14 ENCOUNTER — Other Ambulatory Visit: Payer: Self-pay

## 2021-06-14 VITALS — BP 133/83 | HR 100 | Resp 16 | Ht 62.0 in | Wt 178.8 lb

## 2021-06-14 DIAGNOSIS — M159 Polyosteoarthritis, unspecified: Secondary | ICD-10-CM

## 2021-06-14 DIAGNOSIS — E7849 Other hyperlipidemia: Secondary | ICD-10-CM

## 2021-06-14 DIAGNOSIS — R7303 Prediabetes: Secondary | ICD-10-CM

## 2021-06-14 DIAGNOSIS — I10 Essential (primary) hypertension: Secondary | ICD-10-CM

## 2021-06-14 DIAGNOSIS — H547 Unspecified visual loss: Secondary | ICD-10-CM

## 2021-06-14 DIAGNOSIS — Z13820 Encounter for screening for osteoporosis: Secondary | ICD-10-CM

## 2021-06-14 DIAGNOSIS — Z780 Asymptomatic menopausal state: Secondary | ICD-10-CM

## 2021-06-14 DIAGNOSIS — E559 Vitamin D deficiency, unspecified: Secondary | ICD-10-CM

## 2021-06-14 DIAGNOSIS — E669 Obesity, unspecified: Secondary | ICD-10-CM | POA: Diagnosis not present

## 2021-06-14 LAB — POCT GLYCOSYLATED HEMOGLOBIN (HGB A1C): HbA1c, POC (controlled diabetic range): 6.4 % (ref 0.0–7.0)

## 2021-06-14 MED ORDER — GABAPENTIN 100 MG PO CAPS: ORAL_CAPSULE | ORAL | 5 refills | Status: DC

## 2021-06-14 NOTE — Assessment & Plan Note (Signed)
Reports worsening vision and h/o cataract, efer eye exam ? ?

## 2021-06-14 NOTE — Patient Instructions (Addendum)
Annual exam in October, call if you need me sooner ? ?GlycohB in office today, unchanged, you are still  prediabetic ? ?Please get fasting CBC, lipid, cmp and EGFr, TSH and vit D as soon as possib;le ? ?Please schedule dexa for an early morning appointment ? ?You are referred for eye exam , scale street (nurse please enter referral) ? ?Thanks for choosing [facility], we consider it a privelige to serve you. ? ? ? ?

## 2021-06-15 ENCOUNTER — Encounter: Payer: Self-pay | Admitting: Family Medicine

## 2021-06-15 DIAGNOSIS — M159 Polyosteoarthritis, unspecified: Secondary | ICD-10-CM | POA: Insufficient documentation

## 2021-06-15 NOTE — Assessment & Plan Note (Signed)
?  Patient re-educated about  the importance of commitment to a  minimum of 150 minutes of exercise per week as able. ? ?The importance of healthy food choices with portion control discussed, as well as eating regularly and within a 12 hour window most days. ?The need to choose "clean , green" food 50 to 75% of the time is discussed, as well as to make water the primary drink and set a goal of 64 ounces water daily. ? ?  ? ?  06/14/2021  ?  3:48 PM 04/04/2021  ? 12:04 PM 02/23/2021  ?  3:59 PM  ?Weight /BMI  ?Weight 178 lb 12.8 oz 175 lb 178 lb 3.2 oz  ?Height '5\' 2"'$  (1.575 m) '5\' 2"'$  (1.575 m) '5\' 2"'$  (1.575 m)  ?BMI 32.7 kg/m2 32.01 kg/m2 32.59 kg/m2  ? ? ? ?

## 2021-06-15 NOTE — Progress Notes (Signed)
? ?Paula Best     MRN: [id number]      DOB: 05-Jun-1955 ? ? ?HPI ?Ms. Yearsley is here for follow up and re-evaluation of chronic medical conditions, medication management and review of any available recent lab and radiology data.  ?Preventive health is updated, specifically  Cancer screening and Immunization.   ?Questions or concerns regarding consultations or procedures which the PT has had in the interim are  addressed. ?The PT denies any adverse reactions to current medications since the last visit.  ?C/o generalized joint pains and achig esp in te morning when she first wakes up, the days that she works in particular, gabapentin affords relief at the higher dose ?ROS ?Denies recent fever or chills. ?Denies sinus pressure, nasal congestion, ear pain or sore throat. ?Denies chest congestion, productive cough or wheezing. ?Denies chest pains, palpitations and leg swelling ?Denies abdominal pain, nausea, vomiting,diarrhea or constipation.   ?Denies dysuria, frequency, hesitancy or incontinence. ?Denies headaches, seizures, numbness, or tingling. ?Denies depression, anxiety or insomnia. ?Denies skin break down or rash. ? ? ?PE ? ?BP 133/83   Pulse 100   Resp 16   Ht '5\' 2"'$  (1.575 m)   Wt 178 lb 12.8 oz (81.1 kg)   LMP 07/25/2011   SpO2 93%   BMI 32.70 kg/m?  ? ?Patient alert and oriented and in no cardiopulmonary distress. ? ?HEENT: No facial asymmetry, EOMI,     Neck supple . ? ?Chest: Clear to auscultation bilaterally. ? ?CVS: S1, S2 no murmurs, no S3.Regular rate. ? ?ABD: Soft non tender.  ? ?Ext: No edema ? ?MS: Adequate  though reduced ROM spine, shoulders, hips and knees. ? ?Skin: Intact, no ulcerations or rash noted. ? ?Psych: Good eye contact, normal affect. Memory intact not anxious or depressed appearing. ? ?CNS: CN 2-12 intact, power,  normal throughout.no focal deficits noted. ? ? ?Assessment & Plan ? ?Vision loss ?Reports worsening vision and h/o cataract, efer eye exam ? ? ?Essential  hypertension ?DASH diet and commitment to daily physical activity for a minimum of 30 minutes discussed and encouraged, as a part of hypertension management. ?The importance of attaining a healthy weight is also discussed. ? ? ?  06/14/2021  ?  3:48 PM 04/04/2021  ?  1:00 PM 04/04/2021  ? 12:04 PM 02/26/2021  ?  8:16 AM 02/23/2021  ?  3:59 PM 01/14/2021  ?  2:25 PM 01/12/2021  ?  3:58 PM  ?BP/Weight  ?Systolic BP 350 093 818 299  136 146  ?Diastolic BP 83 80 84 83  88 92  ?Wt. (Lbs) 178.8  175  178.2  179  ?BMI 32.7 kg/m2  32.01 kg/m2  32.59 kg/m2  32.74 kg/m2  ? ? ? ?Controlled, no change in medication ? ? ?Prediabetes ?Patient educated about the importance of limiting  Carbohydrate intake , the need to commit to daily physical activity for a minimum of 30 minutes , and to commit weight loss. ?The fact that changes in all these areas will reduce or eliminate all together the development of diabetes is stressed.  ? ? ?  Latest Ref Rng & Units 06/14/2021  ?  4:11 PM 04/03/2021  ?  8:52 AM 08/19/2020  ?  8:38 AM 04/04/2020  ?  1:56 PM 02/08/2020  ?  4:47 PM  ?Diabetic Labs  ?HbA1c 0.0 - 7.0 % 6.4    6.4    6.2    ?Chol 100 - 199 mg/dL    230     ?  HDL >39 mg/dL    71     ?Calc LDL 0 - 99 mg/dL    144     ?Triglycerides 0 - 149 mg/dL    89     ?Creatinine 0.44 - 1.00 mg/dL  0.77   0.76   0.76     ? ? ?  06/14/2021  ?  3:48 PM 04/04/2021  ?  1:00 PM 04/04/2021  ? 12:04 PM 02/26/2021  ?  8:16 AM 02/23/2021  ?  3:59 PM 01/14/2021  ?  2:25 PM 01/12/2021  ?  3:58 PM  ?BP/Weight  ?Systolic BP 366 815 947 076  136 146  ?Diastolic BP 83 80 84 83  88 92  ?Wt. (Lbs) 178.8  175  178.2  179  ?BMI 32.7 kg/m2  32.01 kg/m2  32.59 kg/m2  32.74 kg/m2  ? ?   ? View : No data to display.  ?  ?  ?  ? ? ?unchanged ? ?Hyperlipidemia ?Hyperlipidemia:Low fat diet discussed and encouraged. ? ? ?Lipid Panel  ?Lab Results  ?Component Value Date  ? CHOL 230 (H) 04/04/2020  ? HDL 71 04/04/2020  ? LDLCALC 144 (H) 04/04/2020  ? TRIG 89 04/04/2020  ? CHOLHDL 3.2  04/04/2020  ? ? ? ?Updated lab needed at/ before next visit. ? ? ?Osteoarthritis, generalized ?Increased pain and stiffness, start gabapentin 100 mg two at bedtime ? ?Obesity (BMI 30.0-34.9) ? ?Patient re-educated about  the importance of commitment to a  minimum of 150 minutes of exercise per week as able. ? ?The importance of healthy food choices with portion control discussed, as well as eating regularly and within a 12 hour window most days. ?The need to choose "clean , green" food 50 to 75% of the time is discussed, as well as to make water the primary drink and set a goal of 64 ounces water daily. ? ?  ? ?  06/14/2021  ?  3:48 PM 04/04/2021  ? 12:04 PM 02/23/2021  ?  3:59 PM  ?Weight /BMI  ?Weight 178 lb 12.8 oz 175 lb 178 lb 3.2 oz  ?Height '5\' 2"'$  (1.575 m) '5\' 2"'$  (1.575 m) '5\' 2"'$  (1.575 m)  ?BMI 32.7 kg/m2 32.01 kg/m2 32.59 kg/m2  ? ? ? ? ?

## 2021-06-15 NOTE — Assessment & Plan Note (Signed)
DASH diet and commitment to daily physical activity for a minimum of 30 minutes discussed and encouraged, as a part of hypertension management. ?The importance of attaining a healthy weight is also discussed. ? ? ?  06/14/2021  ?  3:48 PM 04/04/2021  ?  1:00 PM 04/04/2021  ? 12:04 PM 02/26/2021  ?  8:16 AM 02/23/2021  ?  3:59 PM 01/14/2021  ?  2:25 PM 01/12/2021  ?  3:58 PM  ?BP/Weight  ?Systolic BP 459 977 414 239  136 146  ?Diastolic BP 83 80 84 83  88 92  ?Wt. (Lbs) 178.8  175  178.2  179  ?BMI 32.7 kg/m2  32.01 kg/m2  32.59 kg/m2  32.74 kg/m2  ? ? ? ?Controlled, no change in medication ? ?

## 2021-06-15 NOTE — Assessment & Plan Note (Signed)
Increased pain and stiffness, start gabapentin 100 mg two at bedtime ?

## 2021-06-15 NOTE — Assessment & Plan Note (Signed)
Patient educated about the importance of limiting  Carbohydrate intake , the need to commit to daily physical activity for a minimum of 30 minutes , and to commit weight loss. ?The fact that changes in all these areas will reduce or eliminate all together the development of diabetes is stressed.  ? ? ?  Latest Ref Rng & Units 06/14/2021  ?  4:11 PM 04/03/2021  ?  8:52 AM 08/19/2020  ?  8:38 AM 04/04/2020  ?  1:56 PM 02/08/2020  ?  4:47 PM  ?Diabetic Labs  ?HbA1c 0.0 - 7.0 % 6.4    6.4    6.2    ?Chol 100 - 199 mg/dL    230     ?HDL >39 mg/dL    71     ?Calc LDL 0 - 99 mg/dL    144     ?Triglycerides 0 - 149 mg/dL    89     ?Creatinine 0.44 - 1.00 mg/dL  0.77   0.76   0.76     ? ? ?  06/14/2021  ?  3:48 PM 04/04/2021  ?  1:00 PM 04/04/2021  ? 12:04 PM 02/26/2021  ?  8:16 AM 02/23/2021  ?  3:59 PM 01/14/2021  ?  2:25 PM 01/12/2021  ?  3:58 PM  ?BP/Weight  ?Systolic BP 568 127 517 001  136 146  ?Diastolic BP 83 80 84 83  88 92  ?Wt. (Lbs) 178.8  175  178.2  179  ?BMI 32.7 kg/m2  32.01 kg/m2  32.59 kg/m2  32.74 kg/m2  ? ?   ? View : No data to display.  ?  ?  ?  ? ? ?unchanged ?

## 2021-06-15 NOTE — Assessment & Plan Note (Signed)
Hyperlipidemia:Low fat diet discussed and encouraged. ? ? ?Lipid Panel  ?Lab Results  ?Component Value Date  ? CHOL 230 (H) 04/04/2020  ? HDL 71 04/04/2020  ? LDLCALC 144 (H) 04/04/2020  ? TRIG 89 04/04/2020  ? CHOLHDL 3.2 04/04/2020  ? ? ? ?Updated lab needed at/ before next visit. ? ?

## 2021-06-19 DIAGNOSIS — E559 Vitamin D deficiency, unspecified: Secondary | ICD-10-CM | POA: Diagnosis not present

## 2021-06-19 DIAGNOSIS — I10 Essential (primary) hypertension: Secondary | ICD-10-CM | POA: Diagnosis not present

## 2021-06-19 DIAGNOSIS — E7849 Other hyperlipidemia: Secondary | ICD-10-CM | POA: Diagnosis not present

## 2021-06-20 LAB — CMP14+EGFR
ALT: 17 IU/L (ref 0–32)
AST: 17 IU/L (ref 0–40)
Albumin/Globulin Ratio: 1.7 (ref 1.2–2.2)
Albumin: 4.4 g/dL (ref 3.8–4.8)
Alkaline Phosphatase: 74 IU/L (ref 44–121)
BUN/Creatinine Ratio: 21 (ref 12–28)
BUN: 17 mg/dL (ref 8–27)
Bilirubin Total: 0.2 mg/dL (ref 0.0–1.2)
CO2: 25 mmol/L (ref 20–29)
Calcium: 9.8 mg/dL (ref 8.7–10.3)
Chloride: 102 mmol/L (ref 96–106)
Creatinine, Ser: 0.8 mg/dL (ref 0.57–1.00)
Globulin, Total: 2.6 g/dL (ref 1.5–4.5)
Glucose: 92 mg/dL (ref 70–99)
Potassium: 3.5 mmol/L (ref 3.5–5.2)
Sodium: 141 mmol/L (ref 134–144)
Total Protein: 7 g/dL (ref 6.0–8.5)
eGFR: 82 mL/min/{1.73_m2} (ref >59)

## 2021-06-20 LAB — CBC
Hematocrit: 41.7 % (ref 34.0–46.6)
Hemoglobin: 14.3 g/dL (ref 11.1–15.9)
MCH: 29.6 pg (ref 26.6–33.0)
MCHC: 34.3 g/dL (ref 31.5–35.7)
MCV: 86 fL (ref 79–97)
Platelets: 341 10*3/uL (ref 150–450)
RBC: 4.83 x10E6/uL (ref 3.77–5.28)
RDW: 12.8 % (ref 11.7–15.4)
WBC: 8.1 10*3/uL (ref 3.4–10.8)

## 2021-06-20 LAB — LIPID PANEL
Chol/HDL Ratio: 3.1 ratio (ref 0.0–4.4)
Cholesterol, Total: 213 mg/dL — ABNORMAL HIGH (ref 100–199)
HDL: 69 mg/dL (ref >39)
LDL Chol Calc (NIH): 128 mg/dL — ABNORMAL HIGH (ref 0–99)
Triglycerides: 89 mg/dL (ref 0–149)
VLDL Cholesterol Cal: 16 mg/dL (ref 5–40)

## 2021-06-20 LAB — TSH: TSH: 1.7 u[IU]/mL (ref 0.450–4.500)

## 2021-06-20 LAB — VITAMIN D 25 HYDROXY (VIT D DEFICIENCY, FRACTURES): Vit D, 25-Hydroxy: 29.3 ng/mL — ABNORMAL LOW (ref 30.0–100.0)

## 2021-08-22 DIAGNOSIS — H01002 Unspecified blepharitis right lower eyelid: Secondary | ICD-10-CM | POA: Diagnosis not present

## 2021-08-22 DIAGNOSIS — H01001 Unspecified blepharitis right upper eyelid: Secondary | ICD-10-CM | POA: Diagnosis not present

## 2021-08-22 DIAGNOSIS — H25813 Combined forms of age-related cataract, bilateral: Secondary | ICD-10-CM | POA: Diagnosis not present

## 2021-08-22 DIAGNOSIS — H01004 Unspecified blepharitis left upper eyelid: Secondary | ICD-10-CM | POA: Diagnosis not present

## 2021-08-24 ENCOUNTER — Emergency Department (HOSPITAL_COMMUNITY)
Admission: EM | Admit: 2021-08-24 | Discharge: 2021-08-24 | Disposition: A | Discharge status: Home / Self Care | Payer: BC Managed Care – PPO | Attending: Emergency Medicine | Admitting: Emergency Medicine

## 2021-08-24 ENCOUNTER — Emergency Department (HOSPITAL_COMMUNITY): Payer: BC Managed Care – PPO

## 2021-08-24 ENCOUNTER — Other Ambulatory Visit: Payer: Self-pay

## 2021-08-24 ENCOUNTER — Encounter (HOSPITAL_COMMUNITY): Payer: Self-pay | Admitting: *Deleted

## 2021-08-24 DIAGNOSIS — I10 Essential (primary) hypertension: Secondary | ICD-10-CM | POA: Insufficient documentation

## 2021-08-24 DIAGNOSIS — R200 Anesthesia of skin: Secondary | ICD-10-CM | POA: Diagnosis not present

## 2021-08-24 DIAGNOSIS — R519 Headache, unspecified: Secondary | ICD-10-CM | POA: Diagnosis not present

## 2021-08-24 LAB — CBC WITH DIFFERENTIAL/PLATELET
Abs Immature Granulocytes: 0.04 10*3/uL (ref 0.00–0.07)
Basophils Absolute: 0.1 10*3/uL (ref 0.0–0.1)
Basophils Relative: 1 %
Eosinophils Absolute: 0.2 10*3/uL (ref 0.0–0.5)
Eosinophils Relative: 2 %
HCT: 43.5 % (ref 36.0–46.0)
Hemoglobin: 14.5 g/dL (ref 12.0–15.0)
Immature Granulocytes: 1 %
Lymphocytes Relative: 31 %
Lymphs Abs: 2.4 10*3/uL (ref 0.7–4.0)
MCH: 30.1 pg (ref 26.0–34.0)
MCHC: 33.3 g/dL (ref 30.0–36.0)
MCV: 90.4 fL (ref 80.0–100.0)
Monocytes Absolute: 0.7 10*3/uL (ref 0.1–1.0)
Monocytes Relative: 10 %
Neutro Abs: 4.3 10*3/uL (ref 1.7–7.7)
Neutrophils Relative %: 55 %
Platelets: 332 10*3/uL (ref 150–400)
RBC: 4.81 MIL/uL (ref 3.87–5.11)
RDW: 13.5 % (ref 11.5–15.5)
WBC: 7.7 10*3/uL (ref 4.0–10.5)
nRBC: 0 % (ref 0.0–0.2)

## 2021-08-24 LAB — BASIC METABOLIC PANEL
Anion gap: 5 (ref 5–15)
BUN: 22 mg/dL (ref 8–23)
CO2: 31 mmol/L (ref 22–32)
Calcium: 9.7 mg/dL (ref 8.9–10.3)
Chloride: 103 mmol/L (ref 98–111)
Creatinine, Ser: 0.74 mg/dL (ref 0.44–1.00)
GFR, Estimated: >60 mL/min (ref >60)
Glucose, Bld: 92 mg/dL (ref 70–99)
Potassium: 3.5 mmol/L (ref 3.5–5.1)
Sodium: 139 mmol/L (ref 135–145)

## 2021-08-24 NOTE — Discharge Instructions (Signed)
Follow-up with your family doctor for recheck next week.  Return if any problem

## 2021-08-24 NOTE — ED Triage Notes (Signed)
Pt states she had a headache last night with some numbness to left side of face

## 2021-08-24 NOTE — ED Provider Notes (Signed)
[facility] EMERGENCY DEPARTMENT Provider Note   CSN: [id number] Arrival date & time: 08/24/21  4536     History {Add pertinent medical, surgical, social history, OB history to HPI:1} Chief Complaint  Patient presents with   Headache    Paula Best is a 66 y.o. female.  Patient states that she has a history of hypertension and she had a headache with some numbness to her left face.  The headache seems to be better but she still has mild numbness   Headache     Home Medications Prior to Admission medications   Medication Sig Start Date End Date Taking? Authorizing Provider  acetaminophen (TYLENOL) 650 MG CR tablet Take 650 mg by mouth every 8 (eight) hours as needed for pain.   Yes [provider]  amLODipine (NORVASC) 10 MG tablet Take 1 tablet (10 mg total) by mouth daily. 01/12/21  Yes Fayrene Helper, MD  gabapentin (NEURONTIN) 100 MG capsule Take two capsules at bedtime for generalized pain 06/14/21  Yes Fayrene Helper, MD  triamterene-hydrochlorothiazide (MAXZIDE-25) 37.5-25 MG tablet Take 1 tablet by mouth daily. 01/12/21  Yes Fayrene Helper, MD      Allergies    Patient has no known allergies.    Review of Systems   Review of Systems  Neurological:  Positive for headaches.   Physical Exam Updated Vital Signs BP 131/77 (BP Location: Right Arm)   Pulse 68   Temp 97.9 F (36.6 C) (Oral)   Resp 18   Ht '5\' 2"'$  (1.575 m)   Wt 78 kg   LMP 07/25/2011   SpO2 97%   BMI 31.46 kg/m  Physical Exam  ED Results / Procedures / Treatments   Labs (all labs ordered are listed, but only abnormal results are displayed) Labs Reviewed  CBC WITH DIFFERENTIAL/PLATELET  BASIC METABOLIC PANEL    EKG None  Radiology MR BRAIN WO CONTRAST  Result Date: 08/24/2021 CLINICAL DATA:  Headache since last night, numbness to left side of face EXAM: MRI HEAD WITHOUT CONTRAST TECHNIQUE: Multiplanar, multiecho pulse sequences of the brain and surrounding  structures were obtained without intravenous contrast. COMPARISON:  None Available. FINDINGS: Brain: There is no acute intracranial hemorrhage, extra-axial fluid collection, or acute infarct. Parenchymal volume is normal. The ventricles are normal in size. Gray-white differentiation is preserved. Parenchymal signal is essentially normal, with no significant burden of white matter microangiopathic change. There is no mass lesion.  There is no mass effect or midline shift. Vascular: Normal flow voids. Skull and upper cervical spine: Marrow signal is diffusely T1 hypointense. Sinuses/Orbits: The paranasal sinuses are clear. The globes and orbits are unremarkable. Other: None. IMPRESSION: 1. Normal appearance of the brain. 2. Diffusely T1 hypointense marrow signal. This is nonspecific and can be seen in the setting of smoking, anemia, obesity, or possibly infiltrative marrow process. Recommend correlation with CBC. Electronically Signed   By: Valetta Mole M.D.   On: 08/24/2021 09:31    Procedures Procedures  {Document cardiac monitor, telemetry assessment procedure when appropriate:1}  Medications Ordered in ED Medications - No data to display  ED Course/ Medical Decision Making/ A&P                           Medical Decision Making Amount and/or Complexity of Data Reviewed Labs: ordered. Radiology: ordered.   Patient with atypical headache that is improved.  MRI is negative.  She can follow-up with her PCP  {Document  critical care time when appropriate:1} {Document review of labs and clinical decision tools ie heart score, Chads2Vasc2 etc:1}  {Document your independent review of radiology images, and any outside records:1} {Document your discussion with family members, caretakers, and with consultants:1} {Document social determinants of health affecting pt's care:1} {Document your decision making why or why not admission, treatments were needed:1} Final Clinical Impression(s) / ED  Diagnoses Final diagnoses:  Bad headache    Rx / DC Orders ED Discharge Orders     None

## 2021-09-08 ENCOUNTER — Other Ambulatory Visit: Payer: Self-pay | Admitting: Family Medicine

## 2021-09-08 ENCOUNTER — Ambulatory Visit: Payer: BC Managed Care – PPO | Admitting: Family Medicine

## 2021-09-08 VITALS — BP 147/88 | HR 87 | Resp 16 | Ht 62.0 in | Wt 178.8 lb

## 2021-09-08 DIAGNOSIS — Z1231 Encounter for screening mammogram for malignant neoplasm of breast: Secondary | ICD-10-CM

## 2021-09-08 DIAGNOSIS — M545 Low back pain, unspecified: Secondary | ICD-10-CM | POA: Diagnosis not present

## 2021-09-08 DIAGNOSIS — M79605 Pain in left leg: Secondary | ICD-10-CM

## 2021-09-08 DIAGNOSIS — M79604 Pain in right leg: Secondary | ICD-10-CM | POA: Diagnosis not present

## 2021-09-08 DIAGNOSIS — Z780 Asymptomatic menopausal state: Secondary | ICD-10-CM

## 2021-09-08 DIAGNOSIS — I10 Essential (primary) hypertension: Secondary | ICD-10-CM | POA: Diagnosis not present

## 2021-09-08 DIAGNOSIS — R7303 Prediabetes: Secondary | ICD-10-CM | POA: Diagnosis not present

## 2021-09-08 MED ORDER — PREDNISONE 10 MG PO TABS: 10.0000 mg | ORAL_TABLET | Freq: Two times a day (BID) | ORAL | 0 refills | Status: AC

## 2021-09-08 MED ORDER — KETOROLAC TROMETHAMINE 60 MG/2ML IM SOLN
60.0000 mg | Freq: Once | INTRAMUSCULAR | Status: AC
  Administered 2021-09-08: 60 mg via INTRAMUSCULAR

## 2021-09-08 MED ORDER — METHYLPREDNISOLONE ACETATE 80 MG/ML IJ SUSP
80.0000 mg | Freq: Once | INTRAMUSCULAR | Status: AC
  Administered 2021-09-08: 80 mg via INTRAMUSCULAR

## 2021-09-08 MED ORDER — TRIAMTERENE-HCTZ 37.5-25 MG PO TABS: ORAL_TABLET | ORAL | 3 refills | Status: DC

## 2021-09-08 NOTE — Patient Instructions (Addendum)
Annual exam and re eval blood pressure in 3 months, call if you need me sooner'  Please schedule dexa AND MAMMOGRAM AT CHECKOUT  INCREASE TRIAMTERENE TO ONE AND A HALF TABLETS ONCE DAILY, BLOOD PRESSURE IS HIGH  TORADOL  60 MG AND DEPO MEDROL 80 MG Im IN THE OFFICE TODAY FOR BACK PAIN RADIATING TO LEFT KNEE, CALL BACK IF PAIN IN KNEE  PERSISTS OR WORSENS  SHORT COURSE OF PREDNISONE IS PRESCRIBED FOR PAIN ALSO  FASTING CHEM 7 AND EGFR 3 TO 5 DAYS BEFORE NEXT APPOINTMENT   Thanks for choosing [facility], we consider it a privelige to serve you.

## 2021-09-08 NOTE — Progress Notes (Unsigned)
   Paula Best     MRN: [id number]      DOB: April 18, 1955   HPI Paula Best is here for follow up and re-evaluation of chronic medical conditions, medication management and review of any available recent lab and radiology data.  Preventive health is updated, specifically  Cancer screening and Immunization.   Questions or concerns regarding consultations or procedures which the PT has had in the interim are  addressed. The PT denies any adverse reactions to current medications since the last visit.  Left knee burning pain that wakes hr up at night sometimess   ROS Denies recent fever or chills. Denies sinus pressure, nasal congestion, ear pain or sore throat. Denies chest congestion, productive cough or wheezing. Denies chest pains, palpitations and leg swelling Denies abdominal pain, nausea, vomiting,diarrhea or constipation.   Denies dysuria, frequency, hesitancy or incontinence. Denies joint pain, swelling and limitation in mobility. Denies headaches, seizures, numbness, or tingling. Denies depression, anxiety or insomnia. Denies skin break down or rash.   PE  BP (!) 147/88   Pulse 87   Resp 16   Ht '5\' 2"'$  (1.575 m)   Wt 178 lb 12.8 oz (81.1 kg)   LMP 07/25/2011   SpO2 92%   BMI 32.70 kg/m   Patient alert and oriented and in no cardiopulmonary distress.  HEENT: No facial asymmetry, EOMI,     Neck supple .  Chest: Clear to auscultation bilaterally.  CVS: S1, S2 no murmurs, no S3.Regular rate.  ABD: Soft non tender.   Ext: No edema  MS: Adequate ROM spine, shoulders, hips and knees.  Skin: Intact, no ulcerations or rash noted.  Psych: Good eye contact, normal affect. Memory intact not anxious or depressed appearing.  CNS: CN 2-12 intact, power,  normal throughout.no focal deficits noted.   Assessment & Plan  ***

## 2021-09-18 ENCOUNTER — Encounter: Payer: Self-pay | Admitting: Family Medicine

## 2021-10-06 DIAGNOSIS — R7303 Prediabetes: Secondary | ICD-10-CM | POA: Diagnosis not present

## 2021-10-07 LAB — BMP8+EGFR
BUN/Creatinine Ratio: 24 (ref 12–28)
BUN: 19 mg/dL (ref 8–27)
CO2: 24 mmol/L (ref 20–29)
Calcium: 9.7 mg/dL (ref 8.7–10.3)
Chloride: 103 mmol/L (ref 96–106)
Creatinine, Ser: 0.8 mg/dL (ref 0.57–1.00)
Glucose: 99 mg/dL (ref 70–99)
Potassium: 3.8 mmol/L (ref 3.5–5.2)
Sodium: 142 mmol/L (ref 134–144)
eGFR: 82 mL/min/{1.73_m2} (ref >59)

## 2021-10-19 ENCOUNTER — Encounter (HOSPITAL_COMMUNITY): Payer: Self-pay

## 2021-10-19 ENCOUNTER — Encounter (HOSPITAL_COMMUNITY)
Admission: RE | Admit: 2021-10-19 | Discharge: 2021-10-19 | Disposition: A | Discharge status: Home / Self Care | Payer: BC Managed Care – PPO | Source: Ambulatory Visit | Attending: Ophthalmology | Admitting: Ophthalmology

## 2021-10-19 DIAGNOSIS — H25811 Combined forms of age-related cataract, right eye: Secondary | ICD-10-CM | POA: Diagnosis not present

## 2021-10-23 NOTE — H&P (Signed)
Surgical History & Physical  Patient Name: Paula Best DOB: [birth year]  Surgery: Cataract extraction with intraocular lens implant phacoemulsification; Right Eye  Surgeon: Baruch Goldmann MD Surgery Date:  10-27-21 Pre-Op Date:  10-19-21  HPI: A 26 Yr. old female patient is referred by [facility] for cataract eval, last seen in 2020 In [place] for cataract eval. 1. The patient complains of difficulty when poor night vision, which began 3 years ago. Both eyes are affected. The episode is gradual. The condition's severity increased since last visit. Symptoms occur when the patient is inside. HPI was performed by Baruch Goldmann .  Medical History: Dry Eyes Arthritis High Blood Pressure  Review of Systems Negative Allergic/Immunologic Negative Cardiovascular Negative Constitutional Negative Ear, Nose, Mouth & Throat Negative Endocrine Negative Eyes Negative Gastrointestinal Negative Genitourinary Negative Hemotologic/Lymphatic Negative Integumentary Negative Musculoskeletal Negative Neurological Negative Psychiatry Negative Respiratory  Social   Former smoker   Medication  Meloxicam, Amlodipine, Triamterene, Gabapentin,   Sx/Procedures Appendectomy,   Drug Allergies   NKDA  History & Physical: Heent: Cataract, right eye NECK: supple without bruits LUNGS: lungs clear to auscultation CV: regular rate and rhythm Abdomen: soft and non-tender Impression & Plan: Assessment: 1.  COMBINED FORMS AGE RELATED CATARACT; Both Eyes (H25.813) 2.  BLEPHARITIS; Right Upper Lid, Right Lower Lid, Left Upper Lid, Left Lower Lid (H01.001, H01.002,H01.004,H01.005)  Plan: 1.  Cataract accounts for the patient's decreased vision. This visual impairment is not correctable with a tolerable change in glasses or contact lenses. Cataract surgery with an implantation of a new lens should significantly improve the visual and functional status of the patient. Discussed all  risks, benefits, alternatives, and potential complications. Discussed the procedures and recovery. Patient desires to have surgery. A-scan ordered and performed today for intra-ocular lens calculations. The surgery will be performed in order to improve vision for driving, reading, and for eye examinations. Recommend phacoemulsification with intra-ocular lens. Recommend Dextenza for post-operative pain and inflammation. Right Eye worse - first.. Dilates well - shugarcaine by protocol. Discussed Vivity.  2.  Recommend regular lid cleaning.

## 2021-10-27 ENCOUNTER — Ambulatory Visit (HOSPITAL_COMMUNITY): Payer: BC Managed Care – PPO | Admitting: Anesthesiology

## 2021-10-27 ENCOUNTER — Encounter (HOSPITAL_COMMUNITY): Payer: Self-pay | Admitting: Ophthalmology

## 2021-10-27 ENCOUNTER — Encounter (HOSPITAL_COMMUNITY)
Admission: RE | Disposition: A | Discharge status: Home / Self Care | Payer: Self-pay | Source: Ambulatory Visit | Attending: Ophthalmology

## 2021-10-27 ENCOUNTER — Ambulatory Visit (HOSPITAL_COMMUNITY)
Admission: RE | Admit: 2021-10-27 | Discharge: 2021-10-27 | Disposition: A | Discharge status: Home / Self Care | Payer: BC Managed Care – PPO | Source: Ambulatory Visit | Attending: Ophthalmology | Admitting: Ophthalmology

## 2021-10-27 DIAGNOSIS — H01001 Unspecified blepharitis right upper eyelid: Secondary | ICD-10-CM | POA: Insufficient documentation

## 2021-10-27 DIAGNOSIS — H01004 Unspecified blepharitis left upper eyelid: Secondary | ICD-10-CM | POA: Insufficient documentation

## 2021-10-27 DIAGNOSIS — H01002 Unspecified blepharitis right lower eyelid: Secondary | ICD-10-CM | POA: Insufficient documentation

## 2021-10-27 DIAGNOSIS — Z87891 Personal history of nicotine dependence: Secondary | ICD-10-CM | POA: Insufficient documentation

## 2021-10-27 DIAGNOSIS — J45909 Unspecified asthma, uncomplicated: Secondary | ICD-10-CM | POA: Diagnosis not present

## 2021-10-27 DIAGNOSIS — H25811 Combined forms of age-related cataract, right eye: Secondary | ICD-10-CM | POA: Diagnosis not present

## 2021-10-27 DIAGNOSIS — H01005 Unspecified blepharitis left lower eyelid: Secondary | ICD-10-CM | POA: Diagnosis not present

## 2021-10-27 HISTORY — PX: CATARACT EXTRACTION W/PHACO: SHX586

## 2021-10-27 SURGERY — PHACOEMULSIFICATION, CATARACT, WITH IOL INSERTION
Anesthesia: Monitor Anesthesia Care | Site: Eye | Laterality: Right

## 2021-10-27 MED ORDER — BSS IO SOLN
INTRAOCULAR | Status: DC | PRN
  Administered 2021-10-27: 15 mL via INTRAOCULAR

## 2021-10-27 MED ORDER — SODIUM HYALURONATE 23MG/ML IO SOSY
PREFILLED_SYRINGE | INTRAOCULAR | Status: DC | PRN
  Administered 2021-10-27: 0.6 mL via INTRAOCULAR

## 2021-10-27 MED ORDER — LACTATED RINGERS IV SOLN: INTRAVENOUS | Status: DC

## 2021-10-27 MED ORDER — TETRACAINE HCL 0.5 % OP SOLN
1.0000 [drp] | OPHTHALMIC | Status: AC | PRN
  Administered 2021-10-27 (×3): 1 [drp] via OPHTHALMIC

## 2021-10-27 MED ORDER — TROPICAMIDE 1 % OP SOLN
1.0000 [drp] | OPHTHALMIC | Status: AC | PRN
  Administered 2021-10-27 (×3): 1 [drp] via OPHTHALMIC

## 2021-10-27 MED ORDER — EPINEPHRINE PF 1 MG/ML IJ SOLN
INTRAOCULAR | Status: DC | PRN
  Administered 2021-10-27: 500 mL

## 2021-10-27 MED ORDER — PHENYLEPHRINE HCL 2.5 % OP SOLN
1.0000 [drp] | OPHTHALMIC | Status: AC | PRN
  Administered 2021-10-27 (×3): 1 [drp] via OPHTHALMIC

## 2021-10-27 MED ORDER — POVIDONE-IODINE 5 % OP SOLN
OPHTHALMIC | Status: DC | PRN
  Administered 2021-10-27: 1 via OPHTHALMIC

## 2021-10-27 MED ORDER — STERILE WATER FOR IRRIGATION IR SOLN
Status: DC | PRN
  Administered 2021-10-27: 250 mL

## 2021-10-27 MED ORDER — SODIUM HYALURONATE 10 MG/ML IO SOLUTION
PREFILLED_SYRINGE | INTRAOCULAR | Status: DC | PRN
  Administered 2021-10-27: 0.85 mL via INTRAOCULAR

## 2021-10-27 MED ORDER — NEOMYCIN-POLYMYXIN-DEXAMETH 3.5-10000-0.1 OP SUSP
OPHTHALMIC | Status: DC | PRN
  Administered 2021-10-27: 2 [drp] via OPHTHALMIC

## 2021-10-27 MED ORDER — EPINEPHRINE PF 1 MG/ML IJ SOLN
INTRAMUSCULAR | Status: AC
  Filled 2021-10-27: qty 2

## 2021-10-27 MED ORDER — LIDOCAINE HCL 3.5 % OP GEL
1.0000 | Freq: Once | OPHTHALMIC | Status: AC
  Administered 2021-10-27: 1 via OPHTHALMIC

## 2021-10-27 MED ORDER — LIDOCAINE HCL (PF) 1 % IJ SOLN
INTRAOCULAR | Status: DC | PRN
  Administered 2021-10-27: 1 mL via OPHTHALMIC

## 2021-10-27 SURGICAL SUPPLY — 14 items
CATARACT SUITE SIGHTPATH (MISCELLANEOUS) ×2 IMPLANT
CLOTH BEACON ORANGE TIMEOUT ST (SAFETY) ×3 IMPLANT
EYE SHIELD UNIVERSAL CLEAR (GAUZE/BANDAGES/DRESSINGS) ×1 IMPLANT
FEE CATARACT SUITE SIGHTPATH (MISCELLANEOUS) ×2 IMPLANT
GLOVE BIOGEL PI IND STRL 7.0 (GLOVE) ×4 IMPLANT
GLOVE BIOGEL PI INDICATOR 7.0 (GLOVE) ×2
GLOVE SURG SS PI 7.0 STRL IVOR (GLOVE) ×1 IMPLANT
LENS IOL RAYNER 22.5 (Intraocular Lens) ×2 IMPLANT
LENS IOL RAYONE EMV 22.5 (Intraocular Lens) IMPLANT
PAD ARMBOARD 7.5X6 YLW CONV (MISCELLANEOUS) ×3 IMPLANT
SYR TB 1ML LL NO SAFETY (SYRINGE) ×3 IMPLANT
TAPE SURG TRANSPORE 1 IN (GAUZE/BANDAGES/DRESSINGS) IMPLANT
TAPE SURGICAL TRANSPORE 1 IN (GAUZE/BANDAGES/DRESSINGS) ×2
WATER STERILE IRR 250ML POUR (IV SOLUTION) ×3 IMPLANT

## 2021-10-27 NOTE — Transfer of Care (Signed)
Immediate Anesthesia Transfer of Care Note  Patient: Paula Best  Procedure(s) Performed: CATARACT EXTRACTION PHACO AND INTRAOCULAR LENS PLACEMENT (IOC) (Right: Eye)  Patient Location: Short Stay  Anesthesia Type:MAC  Level of Consciousness: awake and patient cooperative  Airway & Oxygen Therapy: Patient Spontanous Breathing  Post-op Assessment: Report given to RN and Post -op Vital signs reviewed and stable  Post vital signs: Reviewed and stable  Last Vitals:  Vitals Value Taken Time  BP 153/82 10/27/21 0912  Temp 36.5 C 10/27/21 0912  Pulse 69 10/27/21 0912  Resp 16 10/27/21 0912  SpO2 100 % 10/27/21 0912    Last Pain:  Vitals:   10/27/21 0912  TempSrc: Axillary  PainSc: 0-No pain      Patients Stated Pain Goal: 8 (95/09/32 6712)  Complications: No notable events documented.

## 2021-10-27 NOTE — Anesthesia Preprocedure Evaluation (Signed)
Anesthesia Evaluation  Patient identified by MRN, date of birth, ID band Patient awake    Reviewed: Allergy & Precautions, H&P , NPO status , Patient's Chart, lab work & pertinent test results, reviewed documented beta blocker date and time   Airway Mallampati: II  TM Distance: >3 FB Neck ROM: full    Dental no notable dental hx.    Pulmonary asthma , former smoker,    Pulmonary exam normal breath sounds clear to auscultation       Cardiovascular Exercise Tolerance: Good hypertension, + Valvular Problems/Murmurs (Murmur, but no valve abnormalities on Echo)  Rhythm:regular Rate:Normal     Neuro/Psych negative neurological ROS  negative psych ROS   GI/Hepatic negative GI ROS, Neg liver ROS,   Endo/Other  negative endocrine ROS  Renal/GU negative Renal ROS  negative genitourinary   Musculoskeletal   Abdominal   Peds  Hematology  (+) Blood dyscrasia, anemia ,   Anesthesia Other Findings   Reproductive/Obstetrics negative OB ROS                             Anesthesia Physical  Anesthesia Plan  ASA: 3  Anesthesia Plan: MAC   Post-op Pain Management:    Induction:   PONV Risk Score and Plan:   Airway Management Planned:   Additional Equipment:   Intra-op Plan:   Post-operative Plan:   Informed Consent: I have reviewed the patients History and Physical, chart, labs and discussed the procedure including the risks, benefits and alternatives for the proposed anesthesia with the patient or authorized representative who has indicated his/her understanding and acceptance.     Dental Advisory Given  Plan Discussed with: CRNA  Anesthesia Plan Comments:         Anesthesia Quick Evaluation

## 2021-10-27 NOTE — Op Note (Signed)
Date of procedure: 10/27/21  Pre-operative diagnosis:  Visually significant combined form age-related cataract, Right Eye (H25.811)  Post-operative diagnosis:  Visually significant combined form age-related cataract, Right Eye (H25.811)  Procedure: Removal of cataract via phacoemulsification and insertion of intra-ocular lens Rayner [id number] +22.5D into the capsular bag of the Right Eye  Attending surgeon: Gerda Diss. Desmon Hitchner, MD, MA  Anesthesia: MAC, Topical Akten  Complications: None  Estimated Blood Loss: <65m (minimal)  Specimens: None  Implants: As above  Indications:  Visually significant age-related cataract, Right Eye  Procedure:  The patient was seen and identified in the pre-operative area. The operative eye was identified and dilated.  The operative eye was marked.  Topical anesthesia was administered to the operative eye.     The patient was then to the operative suite and placed in the supine position.  A timeout was performed confirming the patient, procedure to be performed, and all other relevant information.   The patient's face was prepped and draped in the usual fashion for intra-ocular surgery.  A lid speculum was placed into the operative eye and the surgical microscope moved into place and focused.  A superotemporal paracentesis was created using a 20 gauge paracentesis blade.  Shugarcaine was injected into the anterior chamber.  Viscoelastic was injected into the anterior chamber.  A temporal clear-corneal main wound incision was created using a 2.465mmicrokeratome.  A continuous curvilinear capsulorrhexis was initiated using an irrigating cystitome and completed using capsulorrhexis forceps.  Hydrodissection and hydrodeliniation were performed.  Viscoelastic was injected into the anterior chamber.  A phacoemulsification handpiece and a chopper as a second instrument were used to remove the nucleus and epinucleus. The irrigation/aspiration handpiece was used to remove any  remaining cortical material.   The capsular bag was reinflated with viscoelastic, checked, and found to be intact.  The intraocular lens was inserted into the capsular bag.  The irrigation/aspiration handpiece was used to remove any remaining viscoelastic.  The clear corneal wound and paracentesis wounds were then hydrated and checked with Weck-Cels to be watertight.  The lid-speculum was removed.  The drape was removed.  The patient's face was cleaned with a wet and dry 4x4.   Maxitrol was instilled in the eye. A clear shield was taped over the eye. The patient was taken to the post-operative care unit in good condition, having tolerated the procedure well.  [facility]-Op Instructions: [facility] and will receive all other orders and instructions.

## 2021-10-27 NOTE — Interval H&P Note (Signed)
History and Physical Interval Note:  10/27/2021 8:47 AM  Paula Best  has presented today for surgery, with the diagnosis of combined forms age related cataract; right.  The various methods of treatment have been discussed with the patient and family. After consideration of risks, benefits and other options for treatment, the patient has consented to  Procedure(s) with comments: CATARACT EXTRACTION PHACO AND INTRAOCULAR LENS PLACEMENT (IOC) (Right) - CDE:  as a surgical intervention.  The patient's history has been reviewed, patient examined, no change in status, stable for surgery.  I have reviewed the patient's chart and labs.  Questions were answered to the patient's satisfaction.     Baruch Goldmann

## 2021-10-27 NOTE — Anesthesia Procedure Notes (Signed)
Date/Time: 10/27/2021 8:51 AM  Performed by: Vista Deck, CRNAPre-anesthesia Checklist: Patient identified, Emergency Drugs available, Suction available, Timeout performed and Patient being monitored Patient Re-evaluated:Patient Re-evaluated prior to induction Oxygen Delivery Method: Nasal Cannula

## 2021-10-27 NOTE — Anesthesia Postprocedure Evaluation (Signed)
Anesthesia Post Note  Patient: Paula Best  Procedure(s) Performed: CATARACT EXTRACTION PHACO AND INTRAOCULAR LENS PLACEMENT (IOC) (Right: Eye)  Patient location during evaluation: Phase II Anesthesia Type: MAC Level of consciousness: awake Pain management: pain level controlled Vital Signs Assessment: post-procedure vital signs reviewed and stable Respiratory status: spontaneous breathing and respiratory function stable Cardiovascular status: blood pressure returned to baseline and stable Postop Assessment: no headache and no apparent nausea or vomiting Anesthetic complications: no Comments: Late entry   No notable events documented.   Last Vitals:  Vitals:   10/27/21 0755 10/27/21 0912  BP: 137/80 (!) 153/82  Pulse: 73 69  Resp: 16 16  Temp: 36.7 C 36.5 C  SpO2: 97% 100%    Last Pain:  Vitals:   10/27/21 0912  TempSrc: Axillary  PainSc: 0-No pain                 Louann Sjogren

## 2021-10-27 NOTE — Discharge Instructions (Addendum)
Please discharge patient when stable, will follow up today with Dr. Mikaella Escalona at the [facility] [place] office at 10:20AM following discharge.  Leave shield in place until visit.  All paperwork with discharge instructions will be given at the office.  [facility] Address:  [address]  [place], [place] [address]  

## 2021-10-30 ENCOUNTER — Encounter (HOSPITAL_COMMUNITY): Payer: Self-pay | Admitting: Ophthalmology

## 2021-11-02 DIAGNOSIS — H25812 Combined forms of age-related cataract, left eye: Secondary | ICD-10-CM | POA: Diagnosis not present

## 2021-11-06 ENCOUNTER — Encounter (HOSPITAL_COMMUNITY)
Admission: RE | Admit: 2021-11-06 | Discharge: 2021-11-06 | Disposition: A | Discharge status: Home / Self Care | Payer: BC Managed Care – PPO | Source: Ambulatory Visit | Attending: Ophthalmology | Admitting: Ophthalmology

## 2021-11-06 ENCOUNTER — Other Ambulatory Visit: Payer: Self-pay

## 2021-11-06 ENCOUNTER — Encounter (HOSPITAL_COMMUNITY): Payer: Self-pay

## 2021-11-06 NOTE — Pre-Procedure Instructions (Signed)
Attempted pre-op phone call. Left VM [phone] for her to call us back.

## 2021-11-08 NOTE — H&P (Signed)
Surgical History & Physical  Patient Name: Paula Best DOB: [birth year]  Surgery: Cataract extraction with intraocular lens implant phacoemulsification; Left Eye  Surgeon: Baruch Goldmann MD Surgery Date:  11-10-21 Pre-Op Date:  11-02-21  HPI: A 29 Yr. old female patient present today for cataract sx pre op OS (11/10/21), s/p OD (10/27/21). 1. The patient states she is doing well after cataract sx. At the corner of her eye she has a FB sensation like something is sticking in her eye. Using combo drop TID OD. 2. For the left eye: Patient complains of reading small print and filling out forms. She stopped driving at night due to poor vision and seeing halos and glare around lights. This is negatively affecting the patient's quality of life and the patient is unable to function adequately in life with the current level of vision. HPI was performed by Baruch Goldmann .  Medical History: Dry Eyes Arthritis High Blood Pressure  Review of Systems Negative Allergic/Immunologic Negative Cardiovascular Negative Constitutional Negative Ear, Nose, Mouth & Throat Negative Endocrine Negative Eyes Negative Gastrointestinal Negative Genitourinary Negative Hemotologic/Lymphatic Negative Integumentary Negative Musculoskeletal Negative Neurological Negative Psychiatry Negative Respiratory  Social   Former smoker   Medication Prednisolone-Moxifloxacin-Bromfenac,  Meloxicam, Amlodipine, Triamterene, Gabapentin,   Sx/Procedures Phaco c IOL OD,  Appendectomy,   Drug Allergies   NKDA  History & Physical: Heent: Cataract, left eye NECK: supple without bruits LUNGS: lungs clear to auscultation CV: regular rate and rhythm Abdomen: soft and non-tender Impression & Plan: Assessment: 1.  CATARACT EXTRACTION STATUS; Right Eye (Z98.41) 2.  INTRAOCULAR LENS IOL ; Right Eye (Z96.1) 3.  COMBINED FORMS AGE RELATED CATARACT; Left Eye (H25.812)  Plan: 1.  1 week after cataract surgery. Doing  well with improved vision and normal eye pressure. Call with any problems or concerns. Continue Pred-Moxi-Brom 2x/day for 3 more weeks.  2.  Doing well since surgery Continue Post-op medications  3.  Cataract accounts for the patient's decreased vision. This visual impairment is not correctable with a tolerable change in glasses or contact lenses. Cataract surgery with an implantation of a new lens should significantly improve the visual and functional status of the patient. Discussed all risks, benefits, alternatives, and potential complications. Discussed the procedures and recovery. Patient desires to have surgery. A-scan ordered and performed today for intra-ocular lens calculations. The surgery will be performed in order to improve vision for driving, reading, and for eye examinations. Recommend phacoemulsification with intra-ocular lens. Recommend Dextenza for post-operative pain and inflammation. Left Eye. Surgery required to correct imbalance of vision. Dilates well - shugarcaine by protocol.

## 2021-11-10 ENCOUNTER — Ambulatory Visit (HOSPITAL_COMMUNITY): Payer: BC Managed Care – PPO | Admitting: Anesthesiology

## 2021-11-10 ENCOUNTER — Encounter (HOSPITAL_COMMUNITY)
Admission: RE | Disposition: A | Discharge status: Home / Self Care | Payer: Self-pay | Source: Ambulatory Visit | Attending: Ophthalmology

## 2021-11-10 ENCOUNTER — Other Ambulatory Visit: Payer: Self-pay

## 2021-11-10 ENCOUNTER — Ambulatory Visit (HOSPITAL_COMMUNITY)
Admission: RE | Admit: 2021-11-10 | Discharge: 2021-11-10 | Disposition: A | Discharge status: Home / Self Care | Payer: BC Managed Care – PPO | Source: Ambulatory Visit | Attending: Ophthalmology | Admitting: Ophthalmology

## 2021-11-10 ENCOUNTER — Encounter (HOSPITAL_COMMUNITY): Payer: Self-pay | Admitting: Ophthalmology

## 2021-11-10 DIAGNOSIS — Z79899 Other long term (current) drug therapy: Secondary | ICD-10-CM | POA: Diagnosis not present

## 2021-11-10 DIAGNOSIS — Z87891 Personal history of nicotine dependence: Secondary | ICD-10-CM | POA: Insufficient documentation

## 2021-11-10 DIAGNOSIS — H25812 Combined forms of age-related cataract, left eye: Secondary | ICD-10-CM | POA: Insufficient documentation

## 2021-11-10 DIAGNOSIS — J4520 Mild intermittent asthma, uncomplicated: Secondary | ICD-10-CM | POA: Diagnosis not present

## 2021-11-10 DIAGNOSIS — J45909 Unspecified asthma, uncomplicated: Secondary | ICD-10-CM | POA: Diagnosis not present

## 2021-11-10 DIAGNOSIS — I10 Essential (primary) hypertension: Secondary | ICD-10-CM | POA: Insufficient documentation

## 2021-11-10 DIAGNOSIS — H269 Unspecified cataract: Secondary | ICD-10-CM

## 2021-11-10 DIAGNOSIS — M1990 Unspecified osteoarthritis, unspecified site: Secondary | ICD-10-CM | POA: Insufficient documentation

## 2021-11-10 HISTORY — PX: CATARACT EXTRACTION W/PHACO: SHX586

## 2021-11-10 SURGERY — PHACOEMULSIFICATION, CATARACT, WITH IOL INSERTION
Anesthesia: Monitor Anesthesia Care | Site: Eye | Laterality: Left

## 2021-11-10 MED ORDER — EPINEPHRINE PF 1 MG/ML IJ SOLN
INTRAOCULAR | Status: DC | PRN
  Administered 2021-11-10: 500 mL

## 2021-11-10 MED ORDER — SODIUM HYALURONATE 10 MG/ML IO SOLUTION
PREFILLED_SYRINGE | INTRAOCULAR | Status: DC | PRN
  Administered 2021-11-10: 0.85 mL via INTRAOCULAR

## 2021-11-10 MED ORDER — LIDOCAINE HCL (PF) 1 % IJ SOLN
INTRAOCULAR | Status: DC | PRN
  Administered 2021-11-10: 1 mL via OPHTHALMIC

## 2021-11-10 MED ORDER — PHENYLEPHRINE HCL 2.5 % OP SOLN
1.0000 [drp] | OPHTHALMIC | Status: AC | PRN
  Administered 2021-11-10 (×3): 1 [drp] via OPHTHALMIC

## 2021-11-10 MED ORDER — BSS IO SOLN
INTRAOCULAR | Status: DC | PRN
  Administered 2021-11-10: 15 mL via INTRAOCULAR

## 2021-11-10 MED ORDER — SODIUM HYALURONATE 23MG/ML IO SOSY
PREFILLED_SYRINGE | INTRAOCULAR | Status: DC | PRN
  Administered 2021-11-10: 0.6 mL via INTRAOCULAR

## 2021-11-10 MED ORDER — NEOMYCIN-POLYMYXIN-DEXAMETH 3.5-10000-0.1 OP SUSP
OPHTHALMIC | Status: DC | PRN
  Administered 2021-11-10: 2 [drp] via OPHTHALMIC

## 2021-11-10 MED ORDER — EPINEPHRINE PF 1 MG/ML IJ SOLN
INTRAMUSCULAR | Status: AC
  Filled 2021-11-10: qty 2

## 2021-11-10 MED ORDER — STERILE WATER FOR IRRIGATION IR SOLN
Status: DC | PRN
  Administered 2021-11-10: 250 mL

## 2021-11-10 MED ORDER — LIDOCAINE HCL 3.5 % OP GEL
1.0000 | Freq: Once | OPHTHALMIC | Status: AC
  Administered 2021-11-10: 1 via OPHTHALMIC

## 2021-11-10 MED ORDER — POVIDONE-IODINE 5 % OP SOLN
OPHTHALMIC | Status: DC | PRN
  Administered 2021-11-10: 1 via OPHTHALMIC

## 2021-11-10 MED ORDER — TROPICAMIDE 1 % OP SOLN
1.0000 [drp] | OPHTHALMIC | Status: AC | PRN
  Administered 2021-11-10 (×3): 1 [drp] via OPHTHALMIC

## 2021-11-10 MED ORDER — TETRACAINE HCL 0.5 % OP SOLN
1.0000 [drp] | OPHTHALMIC | Status: AC | PRN
  Administered 2021-11-10 (×3): 1 [drp] via OPHTHALMIC

## 2021-11-10 SURGICAL SUPPLY — 13 items
CATARACT SUITE SIGHTPATH (MISCELLANEOUS) ×1 IMPLANT
CLOTH BEACON ORANGE TIMEOUT ST (SAFETY) ×2 IMPLANT
EYE SHIELD UNIVERSAL CLEAR (GAUZE/BANDAGES/DRESSINGS) IMPLANT
FEE CATARACT SUITE SIGHTPATH (MISCELLANEOUS) ×2 IMPLANT
GLOVE BIOGEL PI IND STRL 7.0 (GLOVE) ×4 IMPLANT
GLOVE BIOGEL PI INDICATOR 7.0 (GLOVE) ×2
LENS IOL RAYNER 22.0 (Intraocular Lens) ×1 IMPLANT
LENS IOL RAYONE EMV 22.0 (Intraocular Lens) IMPLANT
PAD ARMBOARD 7.5X6 YLW CONV (MISCELLANEOUS) ×2 IMPLANT
SYR TB 1ML LL NO SAFETY (SYRINGE) ×2 IMPLANT
TAPE SURG TRANSPORE 1 IN (GAUZE/BANDAGES/DRESSINGS) IMPLANT
TAPE SURGICAL TRANSPORE 1 IN (GAUZE/BANDAGES/DRESSINGS) ×1
WATER STERILE IRR 250ML POUR (IV SOLUTION) ×2 IMPLANT

## 2021-11-10 NOTE — Anesthesia Procedure Notes (Signed)
Date/Time: 11/10/2021 12:49 PM  Performed by: Vista Deck, CRNAPre-anesthesia Checklist: Patient identified, Emergency Drugs available, Suction available, Timeout performed and Patient being monitored Patient Re-evaluated:Patient Re-evaluated prior to induction Oxygen Delivery Method: Nasal Cannula

## 2021-11-10 NOTE — Op Note (Signed)
Date of procedure: 11/10/21  Pre-operative diagnosis: Visually significant age-related combined cataract, Left Eye (H25.812)  Post-operative diagnosis: Visually significant age-related combined cataract, Left Eye (H25.812)  Procedure: Removal of cataract via phacoemulsification and insertion of intra-ocular lens Rayner [id number] +22.0D into the capsular bag of the Left Eye  Attending surgeon: Gerda Diss. Brylynn Hanssen, MD, MA  Anesthesia: MAC, Topical Akten  Complications: None  Estimated Blood Loss: <19m (minimal)  Specimens: None  Implants: As above  Indications:  Visually significant age-related cataract, Left Eye  Procedure:  The patient was seen and identified in the pre-operative area. The operative eye was identified and dilated.  The operative eye was marked.  Topical anesthesia was administered to the operative eye.     The patient was then to the operative suite and placed in the supine position.  A timeout was performed confirming the patient, procedure to be performed, and all other relevant information.   The patient's face was prepped and draped in the usual fashion for intra-ocular surgery.  A lid speculum was placed into the operative eye and the surgical microscope moved into place and focused.  An inferotemporal paracentesis was created using a 20 gauge paracentesis blade.  Shugarcaine was injected into the anterior chamber.  Viscoelastic was injected into the anterior chamber.  A temporal clear-corneal main wound incision was created using a 2.466mmicrokeratome.  A continuous curvilinear capsulorrhexis was initiated using an irrigating cystitome and completed using capsulorrhexis forceps.  Hydrodissection and hydrodeliniation were performed.  Viscoelastic was injected into the anterior chamber.  A phacoemulsification handpiece and a chopper as a second instrument were used to remove the nucleus and epinucleus. The irrigation/aspiration handpiece was used to remove any remaining  cortical material.   The capsular bag was reinflated with viscoelastic, checked, and found to be intact.  The intraocular lens was inserted into the capsular bag.  The irrigation/aspiration handpiece was used to remove any remaining viscoelastic.  The clear corneal wound and paracentesis wounds were then hydrated and checked with Weck-Cels to be watertight.  Maxitrol was instilled in the eye. The lid-speculum was removed.  The drape was removed.  The patient's face was cleaned with a wet and dry 4x4.    A clear shield was taped over the eye. The patient was taken to the post-operative care unit in good condition, having tolerated the procedure well.  Post-Op Instructions: [facility] and will receive all other orders and instructions.

## 2021-11-10 NOTE — Discharge Instructions (Signed)
Please discharge patient when stable, will follow up today with Dr. Trasean Delima at the [facility] [place] office immediately following discharge.  Leave shield in place until visit.  All paperwork with discharge instructions will be given at the office.  [facility] [place] [facility] Address:  [address]  [place], [place] [address]  

## 2021-11-10 NOTE — Anesthesia Preprocedure Evaluation (Addendum)
Anesthesia Evaluation  Patient identified by MRN, date of birth, ID band Patient awake    Reviewed: Allergy & Precautions, NPO status , Patient's Chart, lab work & pertinent test results  Airway Mallampati: II  TM Distance: >3 FB Neck ROM: Full   Comment: Cervical neck pain with evidence of disc disease Dental  (+) Dental Advisory Given, Missing, Partial Upper   Pulmonary asthma , former smoker,           Cardiovascular Exercise Tolerance: Good hypertension, Pt. on medications + Valvular Problems/Murmurs  Rhythm:Regular Rate:Normal + Systolic murmurs    Neuro/Psych negative neurological ROS  negative psych ROS   GI/Hepatic negative GI ROS, Neg liver ROS,   Endo/Other  negative endocrine ROS  Renal/GU negative Renal ROS  negative genitourinary   Musculoskeletal  (+) Arthritis , Osteoarthritis,    Abdominal   Peds negative pediatric ROS (+)  Hematology  (+) Blood dyscrasia, anemia ,   Anesthesia Other Findings   Reproductive/Obstetrics negative OB ROS                            Anesthesia Physical Anesthesia Plan  ASA: 2  Anesthesia Plan: MAC   Post-op Pain Management: Minimal or no pain anticipated   Induction:   PONV Risk Score and Plan:   Airway Management Planned: Nasal Cannula and Natural Airway  Additional Equipment:   Intra-op Plan:   Post-operative Plan:   Informed Consent: I have reviewed the patients History and Physical, chart, labs and discussed the procedure including the risks, benefits and alternatives for the proposed anesthesia with the patient or authorized representative who has indicated his/her understanding and acceptance.       Plan Discussed with: CRNA and Surgeon  Anesthesia Plan Comments:        Anesthesia Quick Evaluation

## 2021-11-10 NOTE — Transfer of Care (Signed)
Immediate Anesthesia Transfer of Care Note  Patient: Paula Best  Procedure(s) Performed: CATARACT EXTRACTION PHACO AND INTRAOCULAR LENS PLACEMENT (IOC) (Left: Eye)  Patient Location: Short Stay  Anesthesia Type:MAC  Level of Consciousness: awake and patient cooperative  Airway & Oxygen Therapy: Patient Spontanous Breathing  Post-op Assessment: Report given to RN and Post -op Vital signs reviewed and stable  Post vital signs: Reviewed and stable  Last Vitals:  Vitals Value Taken Time  BP 137/79 11/10/21 1308  Temp 36.7 C 11/10/21 1308  Pulse 77 11/10/21 1308  Resp 22 11/10/21 1308  SpO2 100 % 11/10/21 1308    Last Pain:  Vitals:   11/10/21 1308  TempSrc: Oral  PainSc: 0-No pain      Patients Stated Pain Goal: 8 (40/76/80 8811)  Complications: No notable events documented.

## 2021-11-10 NOTE — Anesthesia Postprocedure Evaluation (Signed)
Anesthesia Post Note  Patient: Paula Best  Procedure(s) Performed: CATARACT EXTRACTION PHACO AND INTRAOCULAR LENS PLACEMENT (IOC) (Left: Eye)  Patient location during evaluation: Phase II Anesthesia Type: MAC Level of consciousness: awake and alert and oriented Pain management: pain level controlled Vital Signs Assessment: post-procedure vital signs reviewed and stable Respiratory status: spontaneous breathing, nonlabored ventilation, respiratory function stable and patient connected to nasal cannula oxygen Cardiovascular status: stable and blood pressure returned to baseline Postop Assessment: no apparent nausea or vomiting Anesthetic complications: no   No notable events documented.   Last Vitals:  Vitals:   11/10/21 1211 11/10/21 1308  BP: 138/84 137/79  Pulse: 78 77  Resp: 19 (!) 22  Temp: 36.8 C 36.7 C  SpO2: 97% 100%    Last Pain:  Vitals:   11/10/21 1308  TempSrc: Oral  PainSc: 0-No pain                 Daiveon Markman C Sakira Dahmer

## 2021-11-10 NOTE — Interval H&P Note (Signed)
History and Physical Interval Note:  11/10/2021 12:45 PM  Paula Best  has presented today for surgery, with the diagnosis of combined forms age related cataract; left.  The various methods of treatment have been discussed with the patient and family. After consideration of risks, benefits and other options for treatment, the patient has consented to  Procedure(s) with comments: CATARACT EXTRACTION PHACO AND INTRAOCULAR LENS PLACEMENT (IOC) (Left) - CDE:  as a surgical intervention.  The patient's history has been reviewed, patient examined, no change in status, stable for surgery.  I have reviewed the patient's chart and labs.  Questions were answered to the patient's satisfaction.     Baruch Goldmann

## 2021-11-15 ENCOUNTER — Encounter (HOSPITAL_COMMUNITY): Payer: Self-pay | Admitting: Ophthalmology

## 2022-01-03 DIAGNOSIS — H35461 Secondary vitreoretinal degeneration, right eye: Secondary | ICD-10-CM | POA: Diagnosis not present

## 2022-01-03 DIAGNOSIS — H43811 Vitreous degeneration, right eye: Secondary | ICD-10-CM | POA: Diagnosis not present

## 2022-01-03 DIAGNOSIS — H43393 Other vitreous opacities, bilateral: Secondary | ICD-10-CM | POA: Diagnosis not present

## 2022-01-03 DIAGNOSIS — H31091 Other chorioretinal scars, right eye: Secondary | ICD-10-CM | POA: Diagnosis not present

## 2022-01-03 DIAGNOSIS — H43822 Vitreomacular adhesion, left eye: Secondary | ICD-10-CM | POA: Diagnosis not present

## 2022-01-09 ENCOUNTER — Other Ambulatory Visit: Payer: Self-pay | Admitting: Family Medicine

## 2022-01-10 ENCOUNTER — Ambulatory Visit: Payer: BC Managed Care – PPO | Admitting: Family Medicine

## 2022-01-17 ENCOUNTER — Encounter: Payer: BC Managed Care – PPO | Admitting: Family Medicine

## 2022-01-25 ENCOUNTER — Encounter: Payer: BC Managed Care – PPO | Admitting: Family Medicine

## 2022-01-26 ENCOUNTER — Other Ambulatory Visit: Payer: Self-pay

## 2022-01-26 ENCOUNTER — Telehealth: Payer: Self-pay | Admitting: Family Medicine

## 2022-01-26 MED ORDER — AMLODIPINE BESYLATE 10 MG PO TABS: ORAL_TABLET | ORAL | 1 refills | Status: DC

## 2022-01-26 NOTE — Telephone Encounter (Signed)
Med refilled.

## 2022-01-26 NOTE — Telephone Encounter (Signed)
  Prescription Request  01/26/2022  Is this a "Controlled Substance" medicine? No  LOV: 01/25/2022   What is the name of the medication or equipment? amLODipine (NORVASC) 10 MG tablet   Have you contacted your pharmacy to request a refill? No   Which pharmacy would you like this sent to?  [id number] - [place], [address] [place] [address] Phone: [phone] Fax: [phone]   Patient notified that their request is being sent to the clinical staff for review and that they should receive a response within 2 business days.   Please advise at [phone] (mobile)

## 2022-03-05 ENCOUNTER — Other Ambulatory Visit (HOSPITAL_COMMUNITY): Payer: BC Managed Care – PPO

## 2022-03-05 ENCOUNTER — Ambulatory Visit (HOSPITAL_COMMUNITY): Payer: BC Managed Care – PPO

## 2022-03-16 ENCOUNTER — Encounter: Payer: BC Managed Care – PPO | Admitting: Family Medicine

## 2022-04-06 ENCOUNTER — Other Ambulatory Visit (HOSPITAL_COMMUNITY)
Admission: RE | Admit: 2022-04-06 | Discharge: 2022-04-06 | Disposition: A | Discharge status: Home / Self Care | Payer: BC Managed Care – PPO | Source: Ambulatory Visit | Attending: Family Medicine | Admitting: Family Medicine

## 2022-04-06 ENCOUNTER — Ambulatory Visit (INDEPENDENT_AMBULATORY_CARE_PROVIDER_SITE_OTHER): Payer: BC Managed Care – PPO | Admitting: Family Medicine

## 2022-04-06 ENCOUNTER — Encounter: Payer: Self-pay | Admitting: Family Medicine

## 2022-04-06 ENCOUNTER — Ambulatory Visit (HOSPITAL_COMMUNITY)
Admission: RE | Admit: 2022-04-06 | Discharge: 2022-04-06 | Disposition: A | Discharge status: Home / Self Care | Payer: BC Managed Care – PPO | Source: Ambulatory Visit | Attending: Family Medicine | Admitting: Family Medicine

## 2022-04-06 VITALS — BP 128/84 | HR 105 | Ht 62.0 in | Wt 180.0 lb

## 2022-04-06 DIAGNOSIS — Z1231 Encounter for screening mammogram for malignant neoplasm of breast: Secondary | ICD-10-CM | POA: Diagnosis not present

## 2022-04-06 DIAGNOSIS — R7303 Prediabetes: Secondary | ICD-10-CM | POA: Diagnosis not present

## 2022-04-06 DIAGNOSIS — N761 Subacute and chronic vaginitis: Secondary | ICD-10-CM | POA: Insufficient documentation

## 2022-04-06 DIAGNOSIS — N898 Other specified noninflammatory disorders of vagina: Secondary | ICD-10-CM | POA: Diagnosis not present

## 2022-04-06 DIAGNOSIS — N760 Acute vaginitis: Secondary | ICD-10-CM | POA: Insufficient documentation

## 2022-04-06 DIAGNOSIS — Z780 Asymptomatic menopausal state: Secondary | ICD-10-CM | POA: Insufficient documentation

## 2022-04-06 DIAGNOSIS — E559 Vitamin D deficiency, unspecified: Secondary | ICD-10-CM | POA: Diagnosis not present

## 2022-04-06 DIAGNOSIS — Z0001 Encounter for general adult medical examination with abnormal findings: Secondary | ICD-10-CM

## 2022-04-06 DIAGNOSIS — Z23 Encounter for immunization: Secondary | ICD-10-CM | POA: Diagnosis not present

## 2022-04-06 DIAGNOSIS — I10 Essential (primary) hypertension: Secondary | ICD-10-CM | POA: Diagnosis not present

## 2022-04-06 DIAGNOSIS — E7849 Other hyperlipidemia: Secondary | ICD-10-CM

## 2022-04-06 MED ORDER — TRIAMTERENE-HCTZ 37.5-25 MG PO TABS: ORAL_TABLET | ORAL | 3 refills | Status: DC

## 2022-04-06 MED ORDER — AMLODIPINE BESYLATE 10 MG PO TABS: ORAL_TABLET | ORAL | 3 refills | Status: DC

## 2022-04-06 NOTE — Patient Instructions (Addendum)
Follow-up in 6 months call if you need me sooner.  Blood pressure is excellent continue medication at same dose.  Labs today CBC lipid CMP and EGFR TSH HbA1c and vitamin D.  Please submit self collected  specimen for testing for wet prep and culture based on complaints voiced.  I recommend you get the covid and the RSV vaccine at your pharmacy on 2 different days  It is important that you exercise regularly at least 30 minutes 5 times a week. If you develop chest pain, have severe difficulty breathing, or feel very tired, stop exercising immediately and seek medical attention  Thanks for choosing [facility], we consider it a privelige to serve you.

## 2022-04-06 NOTE — Assessment & Plan Note (Signed)
Hyperlipidemia:Low fat diet discussed and encouraged.   Annual exam as documented. Counseling done  re healthy lifestyle involving commitment to 150 minutes exercise per week, heart healthy diet, and attaining healthy weight.The importance of adequate sleep also discussed. Regular seat belt use and home safety, is also discussed. Changes in health habits are decided on by the patient with goals and time frames  set for achieving them. Immunization and cancer screening needs are specifically addressed at this visit.

## 2022-04-06 NOTE — Assessment & Plan Note (Signed)
3 week  history, self collected specimens for wet prep and cuture

## 2022-04-06 NOTE — Assessment & Plan Note (Signed)
3 week

## 2022-04-07 LAB — VITAMIN D 25 HYDROXY (VIT D DEFICIENCY, FRACTURES): Vit D, 25-Hydroxy: 30.7 ng/mL (ref 30.0–100.0)

## 2022-04-07 LAB — CBC
Hematocrit: 46.4 % (ref 34.0–46.6)
Hemoglobin: 15.4 g/dL (ref 11.1–15.9)
MCH: 29.5 pg (ref 26.6–33.0)
MCHC: 33.2 g/dL (ref 31.5–35.7)
MCV: 89 fL (ref 79–97)
Platelets: 394 10*3/uL (ref 150–450)
RBC: 5.22 x10E6/uL (ref 3.77–5.28)
RDW: 13.1 % (ref 11.7–15.4)
WBC: 9.9 10*3/uL (ref 3.4–10.8)

## 2022-04-07 LAB — CMP14+EGFR
ALT: 18 IU/L (ref 0–32)
AST: 16 IU/L (ref 0–40)
Albumin/Globulin Ratio: 1.5 (ref 1.2–2.2)
Albumin: 4.8 g/dL (ref 3.9–4.9)
Alkaline Phosphatase: 85 IU/L (ref 44–121)
BUN/Creatinine Ratio: 19 (ref 12–28)
BUN: 17 mg/dL (ref 8–27)
Bilirubin Total: <0.2 mg/dL (ref 0.0–1.2)
CO2: 25 mmol/L (ref 20–29)
Calcium: 10.8 mg/dL — ABNORMAL HIGH (ref 8.7–10.3)
Chloride: 100 mmol/L (ref 96–106)
Creatinine, Ser: 0.89 mg/dL (ref 0.57–1.00)
Globulin, Total: 3.2 g/dL (ref 1.5–4.5)
Glucose: 73 mg/dL (ref 70–99)
Potassium: 4 mmol/L (ref 3.5–5.2)
Sodium: 141 mmol/L (ref 134–144)
Total Protein: 8 g/dL (ref 6.0–8.5)
eGFR: 71 mL/min/{1.73_m2} (ref >59)

## 2022-04-07 LAB — LIPID PANEL
Chol/HDL Ratio: 3.4 ratio (ref 0.0–4.4)
Cholesterol, Total: 241 mg/dL — ABNORMAL HIGH (ref 100–199)
HDL: 71 mg/dL (ref >39)
LDL Chol Calc (NIH): 147 mg/dL — ABNORMAL HIGH (ref 0–99)
Triglycerides: 130 mg/dL (ref 0–149)
VLDL Cholesterol Cal: 23 mg/dL (ref 5–40)

## 2022-04-07 LAB — HEMOGLOBIN A1C
Est. average glucose Bld gHb Est-mCnc: 143 mg/dL
Hgb A1c MFr Bld: 6.6 % — ABNORMAL HIGH (ref 4.8–5.6)

## 2022-04-07 LAB — TSH: TSH: 1.31 u[IU]/mL (ref 0.450–4.500)

## 2022-04-08 ENCOUNTER — Encounter: Payer: Self-pay | Admitting: Family Medicine

## 2022-04-08 MED ORDER — ROSUVASTATIN CALCIUM 10 MG PO TABS: 10.0000 mg | ORAL_TABLET | Freq: Every day | ORAL | 1 refills | Status: DC

## 2022-04-08 MED ORDER — METFORMIN HCL 500 MG PO TABS: 500.0000 mg | ORAL_TABLET | Freq: Every day | ORAL | 1 refills | Status: DC

## 2022-04-08 NOTE — Assessment & Plan Note (Signed)
Patient educated about the importance of limiting  Carbohydrate intake , the need to commit to daily physical activity for a minimum of 30 minutes , and to commit weight loss. The fact that changes in all these areas will reduce or eliminate all together the development of diabetes is stressed.      Latest Ref Rng & Units 04/06/2022    4:03 PM 10/06/2021    8:58 AM 08/24/2021    9:27 AM 06/19/2021   10:20 AM 06/14/2021    4:11 PM  Diabetic Labs  HbA1c 4.8 - 5.6 % 6.6     6.4   Chol 100 - 199 mg/dL 241    213    HDL >39 mg/dL 71    69    Calc LDL 0 - 99 mg/dL 147    128    Triglycerides 0 - 149 mg/dL 130    89    Creatinine 0.57 - 1.00 mg/dL 0.89  0.80  0.74  0.80        04/06/2022    3:44 PM 04/06/2022    3:03 PM 04/06/2022    3:00 PM 11/10/2021    1:08 PM 11/10/2021   12:11 PM 11/06/2021    3:34 PM 10/27/2021    9:12 AM  BP/Weight  Systolic BP 950 932 671 245 809  983  Diastolic BP 84 88 95 79 84  82  Wt. (Lbs)   180.04  178.57 178.79   BMI   32.93 kg/m2  32.66 kg/m2 32.7 kg/m2        No data to display          Deteriorated recommend starting metformin 500 mg  daily

## 2022-04-08 NOTE — Assessment & Plan Note (Signed)
Hyperlipidemia:Low fat diet discussed and encouraged.   Lipid Panel  Lab Results  Component Value Date   CHOL 241 (H) 04/06/2022   HDL 71 04/06/2022   LDLCALC 147 (H) 04/06/2022   TRIG 130 04/06/2022   CHOLHDL 3.4 04/06/2022     Recommend statin post visit on lab review

## 2022-04-08 NOTE — Assessment & Plan Note (Signed)
Controlled, no change in medication DASH diet and commitment to daily physical activity for a minimum of 30 minutes discussed and encouraged, as a part of hypertension management. The importance of attaining a healthy weight is also discussed.     04/06/2022    3:44 PM 04/06/2022    3:03 PM 04/06/2022    3:00 PM 11/10/2021    1:08 PM 11/10/2021   12:11 PM 11/06/2021    3:34 PM 10/27/2021    9:12 AM  BP/Weight  Systolic BP 659 935 701 779 390  300  Diastolic BP 84 88 95 79 84  82  Wt. (Lbs)   180.04  178.57 178.79   BMI   32.93 kg/m2  32.66 kg/m2 32.7 kg/m2      Needs EKG at next visit

## 2022-04-08 NOTE — Progress Notes (Signed)
    Paula Best     MRN: [id number]      DOB: 03/02/1956  HPI: Patient is in for annual physical exam. C/o fishy vaginal odor Immunization is reviewed , and  updated if needed.   PE: BP 128/84   Pulse (!) 105   Ht '5\' 2"'$  (1.575 m)   Wt 180 lb 0.6 oz (81.7 kg)   LMP 07/25/2011   SpO2 94%   BMI 32.93 kg/m   Pleasant  female, alert and oriented x 3, in no cardio-pulmonary distress. Afebrile. HEENT No facial trauma or asymetry. Sinuses non tender.  Extra occullar muscles intact.. External ears normal, . Neck: supple, no adenopathy,JVD or thyromegaly.No bruits.  Chest: Clear to ascultation bilaterally.No crackles or wheezes. Non tender to palpation   Cardiovascular system; Heart sounds normal,  S1 and  S2 ,no S3.  No murmur, or thrill. Apical beat not displaced Peripheral pulses normal.  Abdomen: Soft, non tender .   Musculoskeletal exam: Full ROM of spine, hips , shoulders and knees. No deformity ,swelling or crepitus noted. No muscle wasting or atrophy.   Neurologic: Cranial nerves 2 to 12 intact. Power, tone ,sensation  normal throughout. No disturbance in gait. No tremor.  Skin: Intact, no ulceration, erythema , scaling or rash noted. Pigmentation normal throughout  Psych; Normal mood and affect. Judgement and concentration normal   Assessment & Plan:  Annual visit for general adult medical examination with abnormal findings Annual exam as documented. Counseling done  re healthy lifestyle involving commitment to 150 minutes exercise per week, heart healthy diet, and attaining healthy weight.The importance of adequate sleep also discussed. Regular seat belt use and home safety, is also discussed. Changes in health habits are decided on by the patient with goals and time frames  set for achieving them. Immunization and cancer screening needs are specifically addressed at this visit.   Vaginitis 3 week   Vaginal odor 3 week  history, self  collected specimens for wet prep and cuture

## 2022-04-08 NOTE — Assessment & Plan Note (Signed)
Updated lab needed.  

## 2022-04-09 NOTE — Addendum Note (Signed)
Addended by: Smitty Knudsen on: 04/09/2022 02:56 PM   Modules accepted: Orders

## 2022-04-10 LAB — CERVICOVAGINAL ANCILLARY ONLY
Bacterial Vaginitis (gardnerella): NEGATIVE
Candida Glabrata: NEGATIVE
Candida Vaginitis: NEGATIVE
Chlamydia: NEGATIVE
Comment: NEGATIVE
Comment: NEGATIVE
Comment: NEGATIVE
Comment: NEGATIVE
Comment: NEGATIVE
Comment: NORMAL
Neisseria Gonorrhea: NEGATIVE
Trichomonas: NEGATIVE

## 2022-04-13 DIAGNOSIS — H43393 Other vitreous opacities, bilateral: Secondary | ICD-10-CM | POA: Diagnosis not present

## 2022-04-13 DIAGNOSIS — H31091 Other chorioretinal scars, right eye: Secondary | ICD-10-CM | POA: Diagnosis not present

## 2022-04-13 DIAGNOSIS — H43822 Vitreomacular adhesion, left eye: Secondary | ICD-10-CM | POA: Diagnosis not present

## 2022-04-13 DIAGNOSIS — H43811 Vitreous degeneration, right eye: Secondary | ICD-10-CM | POA: Diagnosis not present

## 2022-10-05 ENCOUNTER — Encounter: Payer: Self-pay | Admitting: Family Medicine

## 2022-10-05 ENCOUNTER — Ambulatory Visit: Payer: BC Managed Care – PPO | Admitting: Family Medicine

## 2022-10-05 VITALS — BP 145/87 | HR 75 | Ht 62.0 in | Wt 178.1 lb

## 2022-10-05 DIAGNOSIS — E7849 Other hyperlipidemia: Secondary | ICD-10-CM | POA: Diagnosis not present

## 2022-10-05 DIAGNOSIS — R7303 Prediabetes: Secondary | ICD-10-CM

## 2022-10-05 DIAGNOSIS — E669 Obesity, unspecified: Secondary | ICD-10-CM

## 2022-10-05 DIAGNOSIS — I10 Essential (primary) hypertension: Secondary | ICD-10-CM

## 2022-10-05 MED ORDER — TRIAMTERENE-HCTZ 37.5-25 MG PO TABS: ORAL_TABLET | ORAL | 3 refills | Status: DC

## 2022-10-05 NOTE — Patient Instructions (Signed)
Follow-up in 2 months, call if you need me sooner.  Blood pressure to be really evaluated at the visit.   Please bring your blood pressure cuff and all medicines you are taking to the visits.  Pls schedule annual wellness visit at checkout  Your blood pressure is still high.  You need to increase triamterene to 1-1/2 tablets once daily.  Continue amlodipine 10 mg 1 daily as before.  You will get information regarding community group nutrition classes I do encourage you to go since your blood pressure, blood sugar and your cholesterol are high. .  Changing your eating habits and food choices can correct these and will.  Please get fasting blood work that is already ordered early 1 day next week, we will call you with results.  It is important that you exercise regularly at least 30 minutes 5 times a week. If you develop chest pain, have severe difficulty breathing, or feel very tired, stop exercising immediately and seek medical attention

## 2022-10-06 ENCOUNTER — Encounter: Payer: Self-pay | Admitting: Family Medicine

## 2022-10-06 NOTE — Assessment & Plan Note (Signed)
Patient educated about the importance of limiting  Carbohydrate intake , the need to commit to daily physical activity for a minimum of 30 minutes , and to commit weight loss. The fact that changes in all these areas will reduce or eliminate all together the development of diabetes is stressed.      Latest Ref Rng & Units 04/06/2022    4:03 PM 10/06/2021    8:58 AM 08/24/2021    9:27 AM 06/19/2021   10:20 AM 06/14/2021    4:11 PM  Diabetic Labs  HbA1c 4.8 - 5.6 % 6.6     6.4   Chol 100 - 199 mg/dL 409    811    HDL >91 mg/dL 71    69    Calc LDL 0 - 99 mg/dL 478    295    Triglycerides 0 - 149 mg/dL 621    89    Creatinine 0.57 - 1.00 mg/dL 3.08  6.57  8.46  9.62        10/05/2022    2:58 PM 10/05/2022    2:56 PM 04/06/2022    3:44 PM 04/06/2022    3:03 PM 04/06/2022    3:00 PM 11/10/2021    1:08 PM 11/10/2021   12:11 PM  BP/Weight  Systolic BP 145 147 128 141 145 137 138  Diastolic BP 87 88 84 88 95 79 84  Wt. (Lbs)  178.12   180.04  178.57  BMI  32.58 kg/m2   32.93 kg/m2  32.66 kg/m2       No data to display          Updated lab needed

## 2022-10-06 NOTE — Assessment & Plan Note (Signed)
  Patient re-educated about  the importance of commitment to a  minimum of 150 minutes of exercise per week as able.  The importance of healthy food choices with portion control discussed, as well as eating regularly and within a 12 hour window most days. The need to choose "clean , green" food 50 to 75% of the time is discussed, as well as to make water the primary drink and set a goal of 64 ounces water daily.       10/05/2022    2:56 PM 04/06/2022    3:00 PM 11/10/2021   12:11 PM  Weight /BMI  Weight 178 lb 1.9 oz 180 lb 0.6 oz 178 lb 9.2 oz  Height 5\' 2"  (1.575 m) 5\' 2"  (1.575 m) 5\' 2"  (1.575 m)  BMI 32.58 kg/m2 32.93 kg/m2 32.66 kg/m2    Unchanged essentially, needs to work on lifestyle

## 2022-10-06 NOTE — Assessment & Plan Note (Signed)
Uncontroled, inc triamterene to 1.5 tabs daily DASH diet and commitment to daily physical activity for a minimum of 30 minutes discussed and encouraged, as a part of hypertension management. The importance of attaining a healthy weight is also discussed.     10/05/2022    2:58 PM 10/05/2022    2:56 PM 04/06/2022    3:44 PM 04/06/2022    3:03 PM 04/06/2022    3:00 PM 11/10/2021    1:08 PM 11/10/2021   12:11 PM  BP/Weight  Systolic BP 145 147 128 141 145 137 138  Diastolic BP 87 88 84 88 95 79 84  Wt. (Lbs)  178.12   180.04  178.57  BMI  32.58 kg/m2   32.93 kg/m2  32.66 kg/m2

## 2022-10-06 NOTE — Progress Notes (Signed)
Paula Best     MRN: [id number]      DOB: 03/19/1956  Chief Complaint  Patient presents with   Follow-up    Follow up    HPI Paula Best is here for follow up and re-evaluation of chronic medical conditions, medication management and review of any available recent lab and radiology data.  Preventive health is updated, specifically  Cancer screening and Immunization.   Questions or concerns regarding consultations or procedures which the PT has had in the interim are  addressed. The PT denies any adverse reactions to current medications since the last visit.  There are no new concerns.  There are no specific complaints   ROS Denies recent fever or chills. Denies sinus pressure, nasal congestion, ear pain or sore throat. Denies chest congestion, productive cough or wheezing. Denies chest pains, palpitations and leg swelling Denies abdominal pain, nausea, vomiting,diarrhea or constipation.   Denies dysuria, frequency, hesitancy or incontinence. Denies joint pain, swelling and limitation in mobility. Denies headaches, seizures, numbness, or tingling. Denies depression, anxiety or insomnia. Denies skin break down or rash.   PE  BP (!) 145/87 (BP Location: Right Arm, Patient Position: Sitting, Cuff Size: Large)   Pulse 75   Ht 5\' 2"  (1.575 m)   Wt 178 lb 1.9 oz (80.8 kg)   LMP 07/25/2011   SpO2 91%   BMI 32.58 kg/m   Patient alert and oriented and in no cardiopulmonary distress.  HEENT: No facial asymmetry, EOMI,     Neck supple .  Chest: Clear to auscultation bilaterally.  CVS: S1, S2 no murmurs, no S3.Regular rate.  ABD: Soft non tender.   Ext: No edema  MS: Adequate ROM spine, shoulders, hips and knees.  Skin: Intact, no ulcerations or rash noted.  Psych: Good eye contact, normal affect. Memory intact not anxious or depressed appearing.  CNS: CN 2-12 intact, power,  normal throughout.no focal deficits noted.   Assessment & Plan  Essential  hypertension Uncontroled, inc triamterene to 1.5 tabs daily DASH diet and commitment to daily physical activity for a minimum of 30 minutes discussed and encouraged, as a part of hypertension management. The importance of attaining a healthy weight is also discussed.     10/05/2022    2:58 PM 10/05/2022    2:56 PM 04/06/2022    3:44 PM 04/06/2022    3:03 PM 04/06/2022    3:00 PM 11/10/2021    1:08 PM 11/10/2021   12:11 PM  BP/Weight  Systolic BP 145 147 128 141 145 137 138  Diastolic BP 87 88 84 88 95 79 84  Wt. (Lbs)  178.12   180.04  178.57  BMI  32.58 kg/m2   32.93 kg/m2  32.66 kg/m2       Hyperlipidemia Hyperlipidemia:Low fat diet discussed and encouraged.   Lipid Panel  Lab Results  Component Value Date   CHOL 241 (H) 04/06/2022   HDL 71 04/06/2022   LDLCALC 147 (H) 04/06/2022   TRIG 130 04/06/2022   CHOLHDL 3.4 04/06/2022     Updated lab needed No compliant with meds   Prediabetes Patient educated about the importance of limiting  Carbohydrate intake , the need to commit to daily physical activity for a minimum of 30 minutes , and to commit weight loss. The fact that changes in all these areas will reduce or eliminate all together the development of diabetes is stressed.      Latest Ref Rng & Units 04/06/2022    4:03  PM 10/06/2021    8:58 AM 08/24/2021    9:27 AM 06/19/2021   10:20 AM 06/14/2021    4:11 PM  Diabetic Labs  HbA1c 4.8 - 5.6 % 6.6     6.4   Chol 100 - 199 mg/dL 536    644    HDL >03 mg/dL 71    69    Calc LDL 0 - 99 mg/dL 474    259    Triglycerides 0 - 149 mg/dL 563    89    Creatinine 0.57 - 1.00 mg/dL 8.75  6.43  3.29  5.18        10/05/2022    2:58 PM 10/05/2022    2:56 PM 04/06/2022    3:44 PM 04/06/2022    3:03 PM 04/06/2022    3:00 PM 11/10/2021    1:08 PM 11/10/2021   12:11 PM  BP/Weight  Systolic BP 145 147 128 141 145 137 138  Diastolic BP 87 88 84 88 95 79 84  Wt. (Lbs)  178.12   180.04  178.57  BMI  32.58 kg/m2   32.93 kg/m2  32.66  kg/m2       No data to display          Updated lab needed    Obesity (BMI 30.0-34.9)  Patient re-educated about  the importance of commitment to a  minimum of 150 minutes of exercise per week as able.  The importance of healthy food choices with portion control discussed, as well as eating regularly and within a 12 hour window most days. The need to choose "clean , green" food 50 to 75% of the time is discussed, as well as to make water the primary drink and set a goal of 64 ounces water daily.       10/05/2022    2:56 PM 04/06/2022    3:00 PM 11/10/2021   12:11 PM  Weight /BMI  Weight 178 lb 1.9 oz 180 lb 0.6 oz 178 lb 9.2 oz  Height 5\' 2"  (1.575 m) 5\' 2"  (1.575 m) 5\' 2"  (1.575 m)  BMI 32.58 kg/m2 32.93 kg/m2 32.66 kg/m2    Unchanged essentially, needs to work on lifestyle

## 2022-10-06 NOTE — Assessment & Plan Note (Signed)
Hyperlipidemia:Low fat diet discussed and encouraged.   Lipid Panel  Lab Results  Component Value Date   CHOL 241 (H) 04/06/2022   HDL 71 04/06/2022   LDLCALC 147 (H) 04/06/2022   TRIG 130 04/06/2022   CHOLHDL 3.4 04/06/2022     Updated lab needed No compliant with meds

## 2022-10-11 DIAGNOSIS — E7849 Other hyperlipidemia: Secondary | ICD-10-CM | POA: Diagnosis not present

## 2022-10-11 DIAGNOSIS — R7303 Prediabetes: Secondary | ICD-10-CM | POA: Diagnosis not present

## 2022-10-12 LAB — CMP14+EGFR
ALT: 13 IU/L (ref 0–32)
AST: 17 IU/L (ref 0–40)
Albumin: 4.8 g/dL (ref 3.9–4.9)
Alkaline Phosphatase: 91 IU/L (ref 44–121)
BUN/Creatinine Ratio: 29 — ABNORMAL HIGH (ref 12–28)
BUN: 25 mg/dL (ref 8–27)
Bilirubin Total: 0.3 mg/dL (ref 0.0–1.2)
CO2: 22 mmol/L (ref 20–29)
Calcium: 10.2 mg/dL (ref 8.7–10.3)
Chloride: 100 mmol/L (ref 96–106)
Creatinine, Ser: 0.86 mg/dL (ref 0.57–1.00)
Globulin, Total: 2.7 g/dL (ref 1.5–4.5)
Glucose: 91 mg/dL (ref 70–99)
Potassium: 3.6 mmol/L (ref 3.5–5.2)
Sodium: 141 mmol/L (ref 134–144)
Total Protein: 7.5 g/dL (ref 6.0–8.5)
eGFR: 74 mL/min/{1.73_m2} (ref >59)

## 2022-10-12 LAB — LIPID PANEL WITH LDL/HDL RATIO
Cholesterol, Total: 235 mg/dL — ABNORMAL HIGH (ref 100–199)
HDL: 76 mg/dL (ref >39)
LDL Chol Calc (NIH): 147 mg/dL — ABNORMAL HIGH (ref 0–99)
LDL/HDL Ratio: 1.9 ratio (ref 0.0–3.2)
Triglycerides: 73 mg/dL (ref 0–149)
VLDL Cholesterol Cal: 12 mg/dL (ref 5–40)

## 2022-10-12 LAB — HEMOGLOBIN A1C
Est. average glucose Bld gHb Est-mCnc: 137 mg/dL
Hgb A1c MFr Bld: 6.4 % — ABNORMAL HIGH (ref 4.8–5.6)

## 2022-11-14 ENCOUNTER — Ambulatory Visit
Admission: EM | Admit: 2022-11-14 | Discharge: 2022-11-14 | Disposition: A | Discharge status: Home / Self Care | Payer: Medicare Other | Attending: Family Medicine | Admitting: Family Medicine

## 2022-11-14 ENCOUNTER — Encounter: Payer: Self-pay | Admitting: Emergency Medicine

## 2022-11-14 ENCOUNTER — Other Ambulatory Visit: Payer: Self-pay

## 2022-11-14 DIAGNOSIS — R21 Rash and other nonspecific skin eruption: Secondary | ICD-10-CM | POA: Diagnosis not present

## 2022-11-14 MED ORDER — TRIAMCINOLONE ACETONIDE 0.1 % EX CREA: 1.0000 | TOPICAL_CREAM | Freq: Two times a day (BID) | CUTANEOUS | 0 refills | Status: DC

## 2022-11-14 NOTE — ED Provider Notes (Signed)
[facility]   [id number] 11/14/22 Arrival Time: 1004  ASSESSMENT & PLAN:  1. Rash and nonspecific skin eruption    Unclear etiology. No signs of skin infection. If/as needed: Meds ordered this encounter  Medications   triamcinolone cream (KENALOG) 0.1 %    Sig: Apply 1 Application topically 2 (two) times daily.    Dispense:  15 g    Refill:  0    Will follow up with PCP or here if worsening or failing to improve as anticipated. Reviewed expectations re: course of current medical issues. Questions answered. Outlined signs and symptoms indicating need for more acute intervention. Patient verbalized understanding. After Visit Summary given.   SUBJECTIVE:  Paula Best is a 67 y.o. female who presents with a skin complaint. Area on back of LEFT lower leg. Questions insect bite. Red and itchy. Present for few days; slight improvement since yesterday. Used friends "prescription cream"; feels it helped.  OBJECTIVE: Vitals:   11/14/22 1219  BP: 138/83  Pulse: 69  Resp: 20  Temp: 97.7 F (36.5 C)  TempSrc: Oral  SpO2: 96%    General appearance: alert; no distress HEENT: [place]; AT Extremities: no edema; moves all extremities normally Skin: warm and dry; blotchy reddish-purplish area on L calf; irregular; measuring approx 1x1.5 cm; non-tender; no induration Psychological: alert and cooperative; normal mood and affect  No Known Allergies  Past Medical History:  Diagnosis Date   Anemia, iron deficiency    Heart murmur, systolic    Hypertension    Lipoma    LVH (left ventricular hypertrophy)    mild- Echo 3/11   Menorrhagia    Obesity    Tobacco user    Vitamin D deficiency    Social History   Socioeconomic History   Marital status: Divorced    Spouse name: Not on file   Number of children: 3   Years of education: 11th    Highest education level: Not on file  Occupational History   Occupation: Best boy for [employer]   Tobacco Use    Smoking status: Former    Current packs/day: 0.00    Types: Cigarettes    Quit date: 12/31/2017    Years since quitting: 4.8   Smokeless tobacco: Never  Vaping Use   Vaping status: Never Used  Substance and Sexual Activity   Alcohol use: No    Alcohol/week: 0.0 standard drinks of alcohol   Drug use: No   Sexual activity: Yes    Partners: Male  Other Topics Concern   Not on file  Social History Narrative   Not on file   Social Determinants of Health   Financial Resource Strain: Not on file  Food Insecurity: Not on file  Transportation Needs: Not on file  Physical Activity: Not on file  Stress: Not on file  Social Connections: Not on file  Intimate Partner Violence: Not on file   Family History  Problem Relation Age of Onset   Kidney disease Mother    Hypertension Brother    Asthma Sister    Past Surgical History:  Procedure Laterality Date   APPENDECTOMY     CATARACT EXTRACTION W/PHACO Right 10/27/2021   Procedure: CATARACT EXTRACTION PHACO AND INTRAOCULAR LENS PLACEMENT (IOC);  Surgeon: Fabio Pierce, MD;  Location: [facility];  Service: Ophthalmology;  Laterality: Right;  CDE: 4.87   CATARACT EXTRACTION W/PHACO Left 11/10/2021   Procedure: CATARACT EXTRACTION PHACO AND INTRAOCULAR LENS PLACEMENT (IOC);  Surgeon: Fabio Pierce, MD;  Location: [facility];  Service: Ophthalmology;  Laterality: Left;  CDE: 4.19   COLONOSCOPY WITH PROPOFOL N/A 04/04/2021   Procedure: COLONOSCOPY WITH PROPOFOL;  Surgeon: Lanelle Bal, DO;  Location: [facility];  Service: Endoscopy;  Laterality: N/A;  1:30 / ASA II   DENTAL SURGERY     LIPOMA EXCISION  May 29, Dr Lovell Sheehan   POLYPECTOMY  04/04/2021   Procedure: POLYPECTOMY;  Surgeon: Lanelle Bal, DO;  Location: [facility];  Service: Endoscopy;;   TUBAL LIGATION  26      Mardella Layman, MD 11/14/22 1358

## 2022-11-14 NOTE — ED Triage Notes (Signed)
Pt reports right calf pain possible "scrap or insect bite". Pt reports right calf has been tender to touch, red circular and linear areas of discoloration noted to right calf as well.

## 2022-12-03 DIAGNOSIS — M79671 Pain in right foot: Secondary | ICD-10-CM | POA: Diagnosis not present

## 2022-12-07 ENCOUNTER — Encounter: Payer: Self-pay | Admitting: Pharmacist

## 2022-12-10 DIAGNOSIS — M79671 Pain in right foot: Secondary | ICD-10-CM | POA: Diagnosis not present

## 2022-12-13 ENCOUNTER — Ambulatory Visit (INDEPENDENT_AMBULATORY_CARE_PROVIDER_SITE_OTHER): Payer: BC Managed Care – PPO | Admitting: Family Medicine

## 2022-12-13 ENCOUNTER — Encounter: Payer: Self-pay | Admitting: Family Medicine

## 2022-12-13 VITALS — BP 120/82 | HR 99 | Ht 62.0 in | Wt 171.1 lb

## 2022-12-13 DIAGNOSIS — I10 Essential (primary) hypertension: Secondary | ICD-10-CM

## 2022-12-13 DIAGNOSIS — E559 Vitamin D deficiency, unspecified: Secondary | ICD-10-CM

## 2022-12-13 DIAGNOSIS — Z23 Encounter for immunization: Secondary | ICD-10-CM

## 2022-12-13 DIAGNOSIS — R21 Rash and other nonspecific skin eruption: Secondary | ICD-10-CM

## 2022-12-13 DIAGNOSIS — E7849 Other hyperlipidemia: Secondary | ICD-10-CM | POA: Diagnosis not present

## 2022-12-13 DIAGNOSIS — R7303 Prediabetes: Secondary | ICD-10-CM

## 2022-12-13 DIAGNOSIS — E669 Obesity, unspecified: Secondary | ICD-10-CM

## 2022-12-13 DIAGNOSIS — Z1231 Encounter for screening mammogram for malignant neoplasm of breast: Secondary | ICD-10-CM

## 2022-12-13 MED ORDER — TRIAMCINOLONE ACETONIDE 0.1 % EX CREA: 1.0000 | TOPICAL_CREAM | Freq: Two times a day (BID) | CUTANEOUS | 0 refills | Status: DC

## 2022-12-13 NOTE — Patient Instructions (Addendum)
F/u in January, 3rd  or 2nd week, call if you need me sooner  Flu vaccine today  Pls schedule January mammogram at checkout  Fasting lipid, cmp and eGFR, and hBa1C , cBC, tSH and vit d 1 week before January appointment  Cream refilled for rash on left leg  Thanks for choosing [facility], we consider it a privelige to serve you.

## 2022-12-14 ENCOUNTER — Encounter: Payer: Self-pay | Admitting: Family Medicine

## 2022-12-14 ENCOUNTER — Telehealth: Payer: Self-pay | Admitting: Family Medicine

## 2022-12-14 DIAGNOSIS — R21 Rash and other nonspecific skin eruption: Secondary | ICD-10-CM | POA: Insufficient documentation

## 2022-12-14 NOTE — Assessment & Plan Note (Signed)
Updated lab needed at/ before next visit.

## 2022-12-14 NOTE — Telephone Encounter (Signed)
Wellness form for [employer]   Noted Copied Sleeved (put in provider box)  Call patient when ready for pick, if at all possible need back by 12/24/2022 for job.

## 2022-12-14 NOTE — Assessment & Plan Note (Signed)
Controlled, no change in medication DASH diet and commitment to daily physical activity for a minimum of 30 minutes discussed and encouraged, as a part of hypertension management. The importance of attaining a healthy weight is also discussed.     12/13/2022    4:21 PM 12/13/2022    3:57 PM 11/14/2022   12:19 PM 10/05/2022    2:58 PM 10/05/2022    2:56 PM 04/06/2022    3:44 PM 04/06/2022    3:03 PM  BP/Weight  Systolic BP 120 129 138 145 147 128 141  Diastolic BP 82 89 83 87 88 84 88  Wt. (Lbs)  171.08   178.12    BMI  31.29 kg/m2   32.58 kg/m2

## 2022-12-14 NOTE — Assessment & Plan Note (Signed)
  Patient re-educated about  the importance of commitment to a  minimum of 150 minutes of exercise per week as able.  The importance of healthy food choices with portion control discussed, as well as eating regularly and within a 12 hour window most days. The need to choose "clean , green" food 50 to 75% of the time is discussed, as well as to make water the primary drink and set a goal of 64 ounces water daily.       12/13/2022    3:57 PM 10/05/2022    2:56 PM 04/06/2022    3:00 PM  Weight /BMI  Weight 171 lb 1.3 oz 178 lb 1.9 oz 180 lb 0.6 oz  Height 5\' 2"  (1.575 m) 5\' 2"  (1.575 m) 5\' 2"  (1.575 m)  BMI 31.29 kg/m2 32.58 kg/m2 32.93 kg/m2   ] Improved, which is great

## 2022-12-14 NOTE — Progress Notes (Signed)
Paula Best     MRN: [id number]      DOB: 1955/12/17  Chief Complaint  Patient presents with   Follow-up    Follow up possible bug bite, foot pain     HPI Paula Best is here for follow up and re-evaluation of chronic medical conditions, medication management and review of any available recent lab and radiology data.  Preventive health is updated, specifically  Cancer screening and Immunization.   [facility], for rash on leg , right now similar on left, no drainage , warmth. The PT denies any adverse reactions to current medications since the last visit.   ROS Denies recent fever or chills. Denies sinus pressure, nasal congestion, ear pain or sore throat. Denies chest congestion, productive cough or wheezing. Denies chest pains, palpitations and leg swelling Denies abdominal pain, nausea, vomiting,diarrhea or constipation.   Denies dysuria, frequency, hesitancy or incontinence. Denies joint pain, swelling and limitation in mobility. Denies headaches, seizures, numbness, or tingling. Denies depression, anxiety or insomnia.   PE  BP 120/82   Pulse 99   Ht 5\' 2"  (1.575 m)   Wt 171 lb 1.3 oz (77.6 kg)   LMP 07/25/2011   SpO2 95%   BMI 31.29 kg/m   Patient alert and oriented and in no cardiopulmonary distress.  HEENT: No facial asymmetry, EOMI,     Neck supple .  Chest: Clear to auscultation bilaterally.  CVS: S1, S2 no murmurs, no S3.Regular rate.  ABD: Soft non tender.   Ext: No edema  MS: Adequate ROM spine, shoulders, hips and knees.  Skin: Intact, erythematous macular rash x 2 round dia approx 1.5 cm each on left leg Psych: Good eye contact, normal affect. Memory intact not anxious or depressed appearing.  CNS: CN 2-12 intact, power,  normal throughout.no focal deficits noted.   Assessment & Plan  Essential hypertension Controlled, no change in medication DASH diet and commitment to daily physical activity for a minimum of 30  minutes discussed and encouraged, as a part of hypertension management. The importance of attaining a healthy weight is also discussed.     12/13/2022    4:21 PM 12/13/2022    3:57 PM 11/14/2022   12:19 PM 10/05/2022    2:58 PM 10/05/2022    2:56 PM 04/06/2022    3:44 PM 04/06/2022    3:03 PM  BP/Weight  Systolic BP 120 129 138 145 147 128 141  Diastolic BP 82 89 83 87 88 84 88  Wt. (Lbs)  171.08   178.12    BMI  31.29 kg/m2   32.58 kg/m2         Hyperlipidemia Hyperlipidemia:Low fat diet discussed and encouraged.   Lipid Panel  Lab Results  Component Value Date   CHOL 235 (H) 10/11/2022   HDL 76 10/11/2022   LDLCALC 147 (H) 10/11/2022   TRIG 73 10/11/2022   CHOLHDL 3.4 04/06/2022     Updated lab needed at/ before next visit. Neds to reduce fat intake   Prediabetes Patient educated about the importance of limiting  Carbohydrate intake , the need to commit to daily physical activity for a minimum of 30 minutes , and to commit weight loss. The fact that changes in all these areas will reduce or eliminate all together the development of diabetes is stressed.      Latest Ref Rng & Units 10/11/2022    3:20 PM 04/06/2022    4:03 PM 10/06/2021  8:58 AM 08/24/2021    9:27 AM 06/19/2021   10:20 AM  Diabetic Labs  HbA1c 4.8 - 5.6 % 6.4  6.6      Chol 100 - 199 mg/dL 161  096    045   HDL >40 mg/dL 76  71    69   Calc LDL 0 - 99 mg/dL 981  191    478   Triglycerides 0 - 149 mg/dL 73  295    89   Creatinine 0.57 - 1.00 mg/dL 6.21  3.08  6.57  8.46  0.80       12/13/2022    4:21 PM 12/13/2022    3:57 PM 11/14/2022   12:19 PM 10/05/2022    2:58 PM 10/05/2022    2:56 PM 04/06/2022    3:44 PM 04/06/2022    3:03 PM  BP/Weight  Systolic BP 120 129 138 145 147 128 141  Diastolic BP 82 89 83 87 88 84 88  Wt. (Lbs)  171.08   178.12    BMI  31.29 kg/m2   32.58 kg/m2         No data to display          Updated lab needed at/ before next visit.   Vitamin D  deficiency Updated lab needed at/ before next visit.   Obesity (BMI 30.0-34.9)  Patient re-educated about  the importance of commitment to a  minimum of 150 minutes of exercise per week as able.  The importance of healthy food choices with portion control discussed, as well as eating regularly and within a 12 hour window most days. The need to choose "clean , green" food 50 to 75% of the time is discussed, as well as to make water the primary drink and set a goal of 64 ounces water daily.       12/13/2022    3:57 PM 10/05/2022    2:56 PM 04/06/2022    3:00 PM  Weight /BMI  Weight 171 lb 1.3 oz 178 lb 1.9 oz 180 lb 0.6 oz  Height 5\' 2"  (1.575 m) 5\' 2"  (1.575 m) 5\' 2"  (1.575 m)  BMI 31.29 kg/m2 32.58 kg/m2 32.93 kg/m2   ] Improved, which is great  Rash and nonspecific skin eruption Topical kenalog  prescribed

## 2022-12-14 NOTE — Assessment & Plan Note (Signed)
Hyperlipidemia:Low fat diet discussed and encouraged.   Lipid Panel  Lab Results  Component Value Date   CHOL 235 (H) 10/11/2022   HDL 76 10/11/2022   LDLCALC 147 (H) 10/11/2022   TRIG 73 10/11/2022   CHOLHDL 3.4 04/06/2022     Updated lab needed at/ before next visit. Neds to reduce fat intake

## 2022-12-14 NOTE — Assessment & Plan Note (Signed)
Patient educated about the importance of limiting  Carbohydrate intake , the need to commit to daily physical activity for a minimum of 30 minutes , and to commit weight loss. The fact that changes in all these areas will reduce or eliminate all together the development of diabetes is stressed.      Latest Ref Rng & Units 10/11/2022    3:20 PM 04/06/2022    4:03 PM 10/06/2021    8:58 AM 08/24/2021    9:27 AM 06/19/2021   10:20 AM  Diabetic Labs  HbA1c 4.8 - 5.6 % 6.4  6.6      Chol 100 - 199 mg/dL 409  811    914   HDL >78 mg/dL 76  71    69   Calc LDL 0 - 99 mg/dL 295  621    308   Triglycerides 0 - 149 mg/dL 73  657    89   Creatinine 0.57 - 1.00 mg/dL 8.46  9.62  9.52  8.41  0.80       12/13/2022    4:21 PM 12/13/2022    3:57 PM 11/14/2022   12:19 PM 10/05/2022    2:58 PM 10/05/2022    2:56 PM 04/06/2022    3:44 PM 04/06/2022    3:03 PM  BP/Weight  Systolic BP 120 129 138 145 147 128 141  Diastolic BP 82 89 83 87 88 84 88  Wt. (Lbs)  171.08   178.12    BMI  31.29 kg/m2   32.58 kg/m2         No data to display          Updated lab needed at/ before next visit.

## 2022-12-14 NOTE — Assessment & Plan Note (Signed)
Topical kenalog  prescribed

## 2023-03-13 DIAGNOSIS — M25562 Pain in left knee: Secondary | ICD-10-CM | POA: Diagnosis not present

## 2023-04-10 ENCOUNTER — Ambulatory Visit (INDEPENDENT_AMBULATORY_CARE_PROVIDER_SITE_OTHER): Payer: Medicare Other | Admitting: Family Medicine

## 2023-04-10 ENCOUNTER — Encounter: Payer: Self-pay | Admitting: Family Medicine

## 2023-04-10 VITALS — BP 139/82 | HR 84 | Ht 62.0 in | Wt 177.0 lb

## 2023-04-10 DIAGNOSIS — I10 Essential (primary) hypertension: Secondary | ICD-10-CM

## 2023-04-10 DIAGNOSIS — E7849 Other hyperlipidemia: Secondary | ICD-10-CM

## 2023-04-10 DIAGNOSIS — E66811 Obesity, class 1: Secondary | ICD-10-CM | POA: Diagnosis not present

## 2023-04-10 DIAGNOSIS — E1169 Type 2 diabetes mellitus with other specified complication: Secondary | ICD-10-CM | POA: Diagnosis not present

## 2023-04-10 MED ORDER — HYDROXYZINE HCL 10 MG PO TABS: ORAL_TABLET | ORAL | 3 refills | Status: DC

## 2023-04-10 MED ORDER — MUPIROCIN 2 % EX OINT: 1.0000 | TOPICAL_OINTMENT | Freq: Two times a day (BID) | CUTANEOUS | 1 refills | Status: AC

## 2023-04-10 NOTE — Patient Instructions (Addendum)
 Annual exam in 3 months, call if you need me sooner  Wellness visit with nurse to be scheduled , plst due and too late for welcome to medicare visit  Labs tomorrow please  NEED to take triamterene  one and a half daily as is prescribed, blood pressure still too high  Both covid and RSV vaccines are recommended to reduce risk of  viral pneumonia  Bactroban  ointment is prescribed for nostril  Hydroxyzine  is prescribed for sleep, no TV , no light , no sound when sleeping OK to take melatonin with the hydroxyzine   It is important that you exercise regularly at least 30 minutes 5 times a week. If you develop chest pain, have severe difficulty breathing, or feel very tired, stop exercising immediately and seek medical attention   Thanks for choosing  [facility], we consider it a privelige to serve you.

## 2023-04-11 DIAGNOSIS — E7849 Other hyperlipidemia: Secondary | ICD-10-CM | POA: Diagnosis not present

## 2023-04-11 DIAGNOSIS — I10 Essential (primary) hypertension: Secondary | ICD-10-CM | POA: Diagnosis not present

## 2023-04-11 DIAGNOSIS — R7303 Prediabetes: Secondary | ICD-10-CM | POA: Diagnosis not present

## 2023-04-11 DIAGNOSIS — E559 Vitamin D deficiency, unspecified: Secondary | ICD-10-CM | POA: Diagnosis not present

## 2023-04-12 LAB — CMP14+EGFR
ALT: 16 [IU]/L (ref 0–32)
AST: 17 [IU]/L (ref 0–40)
Albumin: 4.3 g/dL (ref 3.9–4.9)
Alkaline Phosphatase: 83 [IU]/L (ref 44–121)
BUN/Creatinine Ratio: 24 (ref 12–28)
BUN: 21 mg/dL (ref 8–27)
Bilirubin Total: 0.3 mg/dL (ref 0.0–1.2)
CO2: 26 mmol/L (ref 20–29)
Calcium: 10.2 mg/dL (ref 8.7–10.3)
Chloride: 100 mmol/L (ref 96–106)
Creatinine, Ser: 0.88 mg/dL (ref 0.57–1.00)
Globulin, Total: 3.4 g/dL (ref 1.5–4.5)
Glucose: 110 mg/dL — ABNORMAL HIGH (ref 70–99)
Potassium: 3.8 mmol/L (ref 3.5–5.2)
Sodium: 141 mmol/L (ref 134–144)
Total Protein: 7.7 g/dL (ref 6.0–8.5)
eGFR: 72 mL/min/{1.73_m2} (ref >59)

## 2023-04-12 LAB — VITAMIN D 25 HYDROXY (VIT D DEFICIENCY, FRACTURES): Vit D, 25-Hydroxy: 19.8 ng/mL — ABNORMAL LOW (ref 30.0–100.0)

## 2023-04-12 LAB — CBC
Hematocrit: 45.1 % (ref 34.0–46.6)
Hemoglobin: 15.1 g/dL (ref 11.1–15.9)
MCH: 29.9 pg (ref 26.6–33.0)
MCHC: 33.5 g/dL (ref 31.5–35.7)
MCV: 89 fL (ref 79–97)
Platelets: 347 10*3/uL (ref 150–450)
RBC: 5.05 x10E6/uL (ref 3.77–5.28)
RDW: 12.9 % (ref 11.7–15.4)
WBC: 8.1 10*3/uL (ref 3.4–10.8)

## 2023-04-12 LAB — LIPID PANEL
Chol/HDL Ratio: 3.1 {ratio} (ref 0.0–4.4)
Cholesterol, Total: 233 mg/dL — ABNORMAL HIGH (ref 100–199)
HDL: 75 mg/dL (ref >39)
LDL Chol Calc (NIH): 143 mg/dL — ABNORMAL HIGH (ref 0–99)
Triglycerides: 86 mg/dL (ref 0–149)
VLDL Cholesterol Cal: 15 mg/dL (ref 5–40)

## 2023-04-12 LAB — HEMOGLOBIN A1C
Est. average glucose Bld gHb Est-mCnc: 143 mg/dL
Hgb A1c MFr Bld: 6.6 % — ABNORMAL HIGH (ref 4.8–5.6)

## 2023-04-12 LAB — TSH: TSH: 2.13 u[IU]/mL (ref 0.450–4.500)

## 2023-04-12 MED ORDER — ROSUVASTATIN CALCIUM 10 MG PO TABS: 10.0000 mg | ORAL_TABLET | Freq: Every day | ORAL | 5 refills | Status: DC

## 2023-04-12 MED ORDER — METFORMIN HCL 500 MG PO TABS: 500.0000 mg | ORAL_TABLET | Freq: Every day | ORAL | 5 refills | Status: DC

## 2023-04-12 NOTE — Addendum Note (Signed)
Addended by: Kerri Perches on: 04/12/2023 07:15 AM   Modules accepted: Orders

## 2023-04-15 ENCOUNTER — Encounter (HOSPITAL_COMMUNITY): Payer: Self-pay

## 2023-04-15 ENCOUNTER — Ambulatory Visit (HOSPITAL_COMMUNITY)
Admission: RE | Admit: 2023-04-15 | Discharge: 2023-04-15 | Disposition: A | Discharge status: Home / Self Care | Payer: Medicare Other | Source: Ambulatory Visit | Attending: Family Medicine | Admitting: Family Medicine

## 2023-04-15 DIAGNOSIS — Z1231 Encounter for screening mammogram for malignant neoplasm of breast: Secondary | ICD-10-CM | POA: Diagnosis not present

## 2023-04-16 NOTE — Assessment & Plan Note (Addendum)
Diabetes associated with hypertension, hyperlipidemia, and obesity  Paula Best is reminded of the importance of commitment to daily physical activity for 30 minutes or more, as able and the need to limit carbohydrate intake to 30 to 60 grams per meal to help with blood sugar control.   [name] is reminded of the importance of daily foot exam, annual eye examination, and good blood sugar, blood pressure and cholesterol control. Recommended to start metformin daily     Latest Ref Rng & Units 04/11/2023    8:29 AM 10/11/2022    3:20 PM 04/06/2022    4:03 PM 10/06/2021    8:58 AM 08/24/2021    9:27 AM  Diabetic Labs  HbA1c 4.8 - 5.6 % 6.6  6.4  6.6     Chol 100 - 199 mg/dL 161  096  045     HDL >40 mg/dL 75  76  71     Calc LDL 0 - 99 mg/dL 981  191  478     Triglycerides 0 - 149 mg/dL 86  73  295     Creatinine 0.57 - 1.00 mg/dL 6.21  3.08  6.57  8.46  0.74       04/10/2023    3:50 PM 12/13/2022    4:21 PM 12/13/2022    3:57 PM 11/14/2022   12:19 PM 10/05/2022    2:58 PM 10/05/2022    2:56 PM 04/06/2022    3:44 PM  BP/Weight  Systolic BP 139 120 129 138 145 147 128  Diastolic BP 82 82 89 83 87 88 84  Wt. (Lbs) 177  171.08   178.12   BMI 32.37 kg/m2  31.29 kg/m2   32.58 kg/m2        No data to display

## 2023-04-16 NOTE — Assessment & Plan Note (Signed)
Hyperlipidemia:Low fat diet discussed and encouraged.   Lipid Panel  Lab Results  Component Value Date   CHOL 233 (H) 04/11/2023   HDL 75 04/11/2023   LDLCALC 143 (H) 04/11/2023   TRIG 86 04/11/2023   CHOLHDL 3.1 04/11/2023     Needs statin and change in diet

## 2023-04-16 NOTE — Progress Notes (Signed)
Paula Best     MRN: [id number]      DOB: 02-19-1956  Chief Complaint  Patient presents with   Follow-up    Follow up    HPI Paula Best is here for follow up and re-evaluation of chronic medical conditions, medication management and review of any available recent lab and radiology data.  Preventive health is updated, specifically  Cancer screening and Immunization.   Questions or concerns regarding consultations or procedures which the PT has had in the interim are  addressed. The PT denies any adverse reactions to current medications since the last visit.  There are no new concerns.  c/o sore in left nostril x 5 days, no fevr , chills or purulent drainage C/o poor sleep difficulty attaining and maintaining, sleeps on average approx 4  ours/ night  ROS Denies recent fever or chills. Denies sinus pressure, nasal congestion, ear pain or sore throat. Denies chest congestion, productive cough or wheezing. Denies chest pains, palpitations and leg swelling Denies abdominal pain, nausea, vomiting,diarrhea or constipation.   Denies dysuria, frequency, hesitancy or incontinence. Denies uncontrolled joint pain, swelling and limitation in mobility. Denies headaches, seizures, numbness, or tingling. Denies depression, anxiety or insomnia.  PE  BP 139/82 (BP Location: Right Arm, Patient Position: Sitting, Cuff Size: Large)   Pulse 84   Ht 5\' 2"  (1.575 m)   Wt 177 lb (80.3 kg)   LMP 07/25/2011   SpO2 94%   BMI 32.37 kg/m   Patient alert and oriented and in no cardiopulmonary distress.  HEENT: No facial asymmetry, EOMI,     Neck supple .  Chest: Clear to auscultation bilaterally.  CVS: S1, S2 no murmurs, no S3.Regular rate.  ABD: Soft non tender.   Ext: No edema  MS: Adequate ROM spine, shoulders, hips and knees.  Skin: Intact, no ulcerations or rash noted.  Psych: Good eye contact, normal affect. Memory intact not anxious or depressed appearing.  CNS: CN 2-12  intact, power,  normal throughout.no focal deficits noted.   Assessment & Plan Essential hypertension Uncontrolled not takin med as prescribed consistently denies adverse s/e  DASH diet and commitment to daily physical activity for a minimum of 30 minutes discussed and encouraged, as a part of hypertension management. The importance of attaining a healthy weight is also discussed.     04/10/2023    3:50 PM 12/13/2022    4:21 PM 12/13/2022    3:57 PM 11/14/2022   12:19 PM 10/05/2022    2:58 PM 10/05/2022    2:56 PM 04/06/2022    3:44 PM  BP/Weight  Systolic BP 139 120 129 138 145 147 128  Diastolic BP 82 82 89 83 87 88 84  Wt. (Lbs) 177  171.08   178.12   BMI 32.37 kg/m2  31.29 kg/m2   32.58 kg/m2      'Re eval in 6 to  weeks  Hyperlipidemia Hyperlipidemia:Low fat diet discussed and encouraged.   Lipid Panel  Lab Results  Component Value Date   CHOL 233 (H) 04/11/2023   HDL 75 04/11/2023   LDLCALC 143 (H) 04/11/2023   TRIG 86 04/11/2023   CHOLHDL 3.1 04/11/2023     Needs statin and change in diet  Type 2 diabetes mellitus with other specified complication (HCC) Diabetes associated with hypertension, hyperlipidemia, and obesity  Paula Best is reminded of the importance of commitment to daily physical activity for 30 minutes or more, as able and the need to limit carbohydrate intake to  30 to 60 grams per meal to help with blood sugar control.   Paula Best is reminded of the importance of daily foot exam, annual eye examination, and good blood sugar, blood pressure and cholesterol control. Recommended to start metformin daily     Latest Ref Rng & Units 04/11/2023    8:29 AM 10/11/2022    3:20 PM 04/06/2022    4:03 PM 10/06/2021    8:58 AM 08/24/2021    9:27 AM  Diabetic Labs  HbA1c 4.8 - 5.6 % 6.6  6.4  6.6     Chol 100 - 199 mg/dL 528  413  244     HDL >01 mg/dL 75  76  71     Calc LDL 0 - 99 mg/dL 027  253  664     Triglycerides 0 - 149 mg/dL 86  73  403      Creatinine 0.57 - 1.00 mg/dL 4.74  2.59  5.63  8.75  0.74       04/10/2023    3:50 PM 12/13/2022    4:21 PM 12/13/2022    3:57 PM 11/14/2022   12:19 PM 10/05/2022    2:58 PM 10/05/2022    2:56 PM 04/06/2022    3:44 PM  BP/Weight  Systolic BP 139 120 129 138 145 147 128  Diastolic BP 82 82 89 83 87 88 84  Wt. (Lbs) 177  171.08   178.12   BMI 32.37 kg/m2  31.29 kg/m2   32.58 kg/m2        No data to display              Obesity (BMI 30.0-34.9)  Patient re-educated about  the importance of commitment to a  minimum of 150 minutes of exercise per week as able.  The importance of healthy food choices with portion control discussed, as well as eating regularly and within a 12 hour window most days. The need to choose "clean , green" food 50 to 75% of the time is discussed, as well as to make water the primary drink and set a goal of 64 ounces water daily.       04/10/2023    3:50 PM 12/13/2022    3:57 PM 10/05/2022    2:56 PM  Weight /BMI  Weight 177 lb 171 lb 1.3 oz 178 lb 1.9 oz  Height 5\' 2"  (1.575 m) 5\' 2"  (1.575 m) 5\' 2"  (1.575 m)  BMI 32.37 kg/m2 31.29 kg/m2 32.58 kg/m2    Deteriorated , needs to lower weight to improve health

## 2023-04-16 NOTE — Assessment & Plan Note (Signed)
Uncontrolled not takin med as prescribed consistently denies adverse s/e  DASH diet and commitment to daily physical activity for a minimum of 30 minutes discussed and encouraged, as a part of hypertension management. The importance of attaining a healthy weight is also discussed.     04/10/2023    3:50 PM 12/13/2022    4:21 PM 12/13/2022    3:57 PM 11/14/2022   12:19 PM 10/05/2022    2:58 PM 10/05/2022    2:56 PM 04/06/2022    3:44 PM  BP/Weight  Systolic BP 139 120 129 138 145 147 128  Diastolic BP 82 82 89 83 87 88 84  Wt. (Lbs) 177  171.08   178.12   BMI 32.37 kg/m2  31.29 kg/m2   32.58 kg/m2      'Re eval in 6 to  weeks

## 2023-04-16 NOTE — Assessment & Plan Note (Signed)
  Patient re-educated about  the importance of commitment to a  minimum of 150 minutes of exercise per week as able.  The importance of healthy food choices with portion control discussed, as well as eating regularly and within a 12 hour window most days. The need to choose "clean , green" food 50 to 75% of the time is discussed, as well as to make water the primary drink and set a goal of 64 ounces water daily.       04/10/2023    3:50 PM 12/13/2022    3:57 PM 10/05/2022    2:56 PM  Weight /BMI  Weight 177 lb 171 lb 1.3 oz 178 lb 1.9 oz  Height 5\' 2"  (1.575 m) 5\' 2"  (1.575 m) 5\' 2"  (1.575 m)  BMI 32.37 kg/m2 31.29 kg/m2 32.58 kg/m2    Deteriorated , needs to lower weight to improve health

## 2023-05-20 ENCOUNTER — Other Ambulatory Visit: Payer: Self-pay | Admitting: Family Medicine

## 2023-05-20 DIAGNOSIS — I10 Essential (primary) hypertension: Secondary | ICD-10-CM

## 2023-07-12 ENCOUNTER — Encounter: Payer: Self-pay | Admitting: Family Medicine

## 2023-07-31 ENCOUNTER — Encounter (HOSPITAL_COMMUNITY): Payer: Self-pay

## 2023-08-02 ENCOUNTER — Ambulatory Visit
Admission: EM | Admit: 2023-08-02 | Discharge: 2023-08-02 | Disposition: A | Discharge status: Home / Self Care | Attending: Nurse Practitioner | Admitting: Nurse Practitioner

## 2023-08-02 DIAGNOSIS — M25562 Pain in left knee: Secondary | ICD-10-CM | POA: Diagnosis not present

## 2023-08-02 DIAGNOSIS — G8929 Other chronic pain: Secondary | ICD-10-CM | POA: Diagnosis not present

## 2023-08-02 DIAGNOSIS — Z8739 Personal history of other diseases of the musculoskeletal system and connective tissue: Secondary | ICD-10-CM

## 2023-08-02 MED ORDER — MELOXICAM 7.5 MG PO TABS: 7.5000 mg | ORAL_TABLET | Freq: Every day | ORAL | 0 refills | Status: DC

## 2023-08-02 MED ORDER — DEXAMETHASONE SODIUM PHOSPHATE 10 MG/ML IJ SOLN
10.0000 mg | INTRAMUSCULAR | Status: AC
  Administered 2023-08-02: 10 mg via INTRAMUSCULAR

## 2023-08-02 NOTE — ED Provider Notes (Signed)
 [facility]    CSN: [id number] Arrival date & time: 08/02/23  1825      History   Chief Complaint No chief complaint on file.   HPI Paula Best is a 68 y.o. female.   The history is provided by the patient.   Patient presents for complaints of left knee pain.  This is a chronic condition for the patient.  Patient states she has had pain for the past 2 months, with worsening over the past several days.  Patient states she did take gabapentin  at home.  She endorses swelling, but that has since improved.  Patient states pain worsens when she is trying to get in and out of her truck and with certain movement.  Patient states that she has received injections in the left knee in the past, but feels that they may have made her symptoms worse.  She denies injury, trauma, numbness, tingling, radiation of pain, inability to bear weight, or weakness or instability of the knee.  Patient reports history of osteoarthritis.  Per review of the chart, patient with bilateral knee pain that dates back to 2021.  Past Medical History:  Diagnosis Date   Anemia, iron deficiency    Heart murmur, systolic    Hypertension    Lipoma    LVH (left ventricular hypertrophy)    mild- Echo 3/11   Menorrhagia    Obesity    Tobacco user    Vitamin D  deficiency     Patient Active Problem List   Diagnosis Date Noted   Rash and nonspecific skin eruption 12/14/2022   Osteoarthritis, generalized 06/15/2021   Insomnia 04/07/2020   Knee pain, bilateral 11/23/2019   Hyperlipidemia 06/24/2019   Vision loss 09/14/2018   Endometrial polyp 02/26/2018   Obesity (BMI 30.0-34.9) 11/02/2013   Abnormal endometrial ultrasound 11/02/2013   Ovarian cyst, needs rept uS  in8/2014 11/21/2011   Cervical neck pain with evidence of disc disease 10/24/2011   Lumbar pain with radiation down both legs 10/24/2011   Type 2 diabetes mellitus with other specified complication (HCC) 09/02/2011   Carotid bruit  05/22/2011   Vitamin D  deficiency 06/30/2009   HEART MURMUR, SYSTOLIC 05/31/2009   Iron deficiency anemia, unspecified 09/12/2007   Asthma, mild intermittent 09/12/2007   LIPOMA 08/29/2007   Essential hypertension 08/29/2007    Past Surgical History:  Procedure Laterality Date   APPENDECTOMY     CATARACT EXTRACTION W/PHACO Right 10/27/2021   Procedure: CATARACT EXTRACTION PHACO AND INTRAOCULAR LENS PLACEMENT (IOC);  Surgeon: Tarri Farm, MD;  Location: [facility];  Service: Ophthalmology;  Laterality: Right;  CDE: 4.87   CATARACT EXTRACTION W/PHACO Left 11/10/2021   Procedure: CATARACT EXTRACTION PHACO AND INTRAOCULAR LENS PLACEMENT (IOC);  Surgeon: Tarri Farm, MD;  Location: [facility];  Service: Ophthalmology;  Laterality: Left;  CDE: 4.19   COLONOSCOPY WITH PROPOFOL  N/A 04/04/2021   Procedure: COLONOSCOPY WITH PROPOFOL ;  Surgeon: Vinetta Greening, DO;  Location: [facility];  Service: Endoscopy;  Laterality: N/A;  1:30 / ASA II   DENTAL SURGERY     LIPOMA EXCISION  May 29, Dr Larrie Po   POLYPECTOMY  04/04/2021   Procedure: POLYPECTOMY;  Surgeon: Vinetta Greening, DO;  Location: [facility];  Service: Endoscopy;;   TUBAL LIGATION  26    OB History   No obstetric history on file.      Home Medications    Prior to Admission medications   Medication Sig Start Date End Date Taking? Authorizing Provider  meloxicam (MOBIC) 7.5 MG tablet Take 1 tablet (7.5 mg total) by mouth daily. 08/02/23  Yes Leath-Warren, Belen Bowers, NP  acetaminophen (TYLENOL) 650 MG CR tablet Take 650 mg by mouth every 8 (eight) hours as needed for pain.    [provider]  amLODipine  (NORVASC ) 10 MG tablet TAKE 1 TABLET(10 MG) BY MOUTH DAILY 05/20/23   Towanda Fret, MD  hydrOXYzine  (ATARAX ) 10 MG tablet Take one tablet at bedtime for sleep 04/10/23   Towanda Fret, MD  metFORMIN  (GLUCOPHAGE ) 500 MG tablet Take 1 tablet (500 mg total) by mouth daily with breakfast. 04/12/23   Towanda Fret, MD  mupirocin  ointment (BACTROBAN ) 2 % Apply 1 Application topically 2 (two) times daily. Apply twice daily to sore for 5 days , then as needed 04/10/23   Towanda Fret, MD  rosuvastatin  (CRESTOR ) 10 MG tablet Take 1 tablet (10 mg total) by mouth daily. 04/12/23   Towanda Fret, MD  triamcinolone  cream (KENALOG ) 0.1 % Apply 1 Application topically 2 (two) times daily. 12/13/22   Towanda Fret, MD  triamterene -hydrochlorothiazide  (MAXZIDE-25) 37.5-25 MG tablet Take one and a half tablets by mouth once daily for blood pressure 10/05/22   Towanda Fret, MD    Family History Family History  Problem Relation Age of Onset   Kidney disease Mother    Hypertension Brother    Asthma Sister     Social History Social History   Tobacco Use   Smoking status: Former    Current packs/day: 0.00    Types: Cigarettes    Quit date: 12/31/2017    Years since quitting: 5.5   Smokeless tobacco: Never  Vaping Use   Vaping status: Never Used  Substance Use Topics   Alcohol use: No    Alcohol/week: 0.0 standard drinks of alcohol   Drug use: No     Allergies   Patient has no known allergies.   Review of Systems Review of Systems Per HPI  Physical Exam Triage Vital Signs ED Triage Vitals  Encounter Vitals Group     BP 08/02/23 1833 (!) 176/76     Systolic BP Percentile --      Diastolic BP Percentile --      Pulse Rate 08/02/23 1833 88     Resp 08/02/23 1833 16     Temp 08/02/23 1833 97.6 F (36.4 C)     Temp Source 08/02/23 1833 Oral     SpO2 08/02/23 1833 95 %     Weight --      Height --      Head Circumference --      Peak Flow --      Pain Score 08/02/23 1831 3     Pain Loc --      Pain Education --      Exclude from Growth Chart --    No data found.  Updated Vital Signs BP (!) 176/76 (BP Location: Right Arm)   Pulse 88   Temp 97.6 F (36.4 C) (Oral)   Resp 16   LMP 07/25/2011   SpO2 95%   Visual Acuity Right Eye Distance:   Left Eye  Distance:   Bilateral Distance:    Right Eye Near:   Left Eye Near:    Bilateral Near:     Physical Exam Vitals and nursing note reviewed.  Constitutional:      General: She is not in acute distress.    Appearance: Normal appearance.  Eyes:  Extraocular Movements: Extraocular movements intact.     Pupils: Pupils are equal, round, and reactive to light.  Pulmonary:     Effort: Pulmonary effort is normal.  Musculoskeletal:     Cervical back: Normal range of motion.     Left knee: No swelling, deformity, effusion or erythema. Normal range of motion. No tenderness. Normal pulse.  Skin:    General: Skin is warm and dry.  Neurological:     General: No focal deficit present.     Mental Status: She is alert and oriented to person, place, and time.  Psychiatric:        Mood and Affect: Mood normal.        Behavior: Behavior normal.      UC Treatments / Results  Labs (all labs ordered are listed, but only abnormal results are displayed) Labs Reviewed - No data to display  EKG   Radiology No results found.  Procedures Procedures (including critical care time)  Medications Ordered in UC Medications  dexamethasone  (DECADRON ) injection 10 mg (10 mg Intramuscular Given 08/02/23 1850)    Initial Impression / Assessment and Plan / UC Course  I have reviewed the triage vital signs and the nursing notes.  Pertinent labs & imaging results that were available during my care of the patient were reviewed by me and considered in my medical decision making (see chart for details).  Patient with underlying history of chronic knee pain.  Patient with underlying history of osteoarthritis.  Symptoms consistent with osteoarthritis of the left knee.  Decadron  10 mg IM administered for inflammation and pain.  A hinged knee brace was applied to provide additional compression and support.  Will start patient on meloxicam 7.5 mg daily for pain.  Supportive care recommendations were provided and  discussed with the patient to include RICE therapy, and over-the-counter Tylenol for breakthrough pain.  A referral was placed for patient to follow-up with [facility].  Patient advised to call to schedule an appointment.  Patient was in agreement with this plan of care and verbalizes understanding.  All questions were answered.  Patient stable for discharge. Final Clinical Impressions(s) / UC Diagnoses   Final diagnoses:  Chronic pain of left knee  History of osteoarthritis     Discharge Instructions      You were given an injection of Decadron  10 mg today.  Do not take any additional NSAIDs to include ibuprofen , Aleve , Advil , or naproxen .  You may take Tylenol for breakthrough pain. Take medication as prescribed. RICE therapy, rest, ice, compression, and elevation.  Apply ice for 20 minutes, remove for 1 hour, repeat as needed when pain and swelling persist. A brace has been provided to offer additional compression and support.  You should wear the brace when you are engaged in prolonged or strenuous activity. I have placed a referral for you to follow-up with orthopedics.  Please call to schedule an appointment. Follow-up as needed.   ED Prescriptions     Medication Sig Dispense Auth. Provider   meloxicam (MOBIC) 7.5 MG tablet Take 1 tablet (7.5 mg total) by mouth daily. 30 tablet Leath-Warren, Belen Bowers, NP      PDMP not reviewed this encounter.   Hardy Lia, NP 08/02/23 [phone]

## 2023-08-02 NOTE — Discharge Instructions (Addendum)
 You were given an injection of Decadron  10 mg today.  Do not take any additional NSAIDs to include ibuprofen , Aleve , Advil , or naproxen .  You may take Tylenol for breakthrough pain. Take medication as prescribed. RICE therapy, rest, ice, compression, and elevation.  Apply ice for 20 minutes, remove for 1 hour, repeat as needed when pain and swelling persist. A brace has been provided to offer additional compression and support.  You should wear the brace when you are engaged in prolonged or strenuous activity. I have placed a referral for you to follow-up with orthopedics.  Please call to schedule an appointment. Follow-up as needed.

## 2023-08-02 NOTE — ED Triage Notes (Signed)
 Pt presents to UC for c/o left knee swelling and pain x4 days.  Denies falling or direct injury. Took tylenol and gabapentin  w/o relief.

## 2023-08-07 ENCOUNTER — Ambulatory Visit

## 2023-08-07 VITALS — Ht 62.0 in | Wt 178.0 lb

## 2023-08-07 DIAGNOSIS — E7849 Other hyperlipidemia: Secondary | ICD-10-CM

## 2023-08-07 DIAGNOSIS — E66811 Obesity, class 1: Secondary | ICD-10-CM

## 2023-08-07 DIAGNOSIS — Z0001 Encounter for general adult medical examination with abnormal findings: Secondary | ICD-10-CM | POA: Diagnosis not present

## 2023-08-07 DIAGNOSIS — E1169 Type 2 diabetes mellitus with other specified complication: Secondary | ICD-10-CM | POA: Diagnosis not present

## 2023-08-07 DIAGNOSIS — Z0000 Encounter for general adult medical examination without abnormal findings: Secondary | ICD-10-CM

## 2023-08-07 NOTE — Patient Instructions (Signed)
 Ms. Samora , Thank you for taking time out of your busy schedule to complete your Annual Wellness Visit with me. I enjoyed our conversation and look forward to speaking with you again next year. I, as well as your care team,  appreciate your ongoing commitment to your health goals. Please review the following plan we discussed and let me know if I can assist you in the future.  Your Game plan/ To Do List     Referrals: If you haven't heard from the office you've been referred to, please reach out to them at the phone number provided.  Please have the following labs drawn at [facility] at [facility]. You do not have to schedule an appointment for this.  Diabetic Urine Screen  Follow up Visits:  Next Medicare AWV with our clinical staff: Aug 11, 2024 at 10:00 am video visit    Have you seen your provider in the last 6 months (3 months if uncontrolled diabetes)? Yes  Next Office Visit with your provider: September 12, 2023 at 3:40 pm for your yearly physical   Clinician Recommendations:    Aim for 30 minutes of exercise or brisk walking, 6-8 glasses of water , and 5 servings of fruits and vegetables each day.   I enjoyed our conversation today and look forward to talking with you again next year!! Have a wonderful and safe year. All the best, Herlinda Heady      This is a list of the screening recommended for you and due dates:  Health Maintenance  Topic Date Due   Complete foot exam   Never done   Eye exam for diabetics  Never done   Yearly kidney health urinalysis for diabetes  Never done   COVID-19 Vaccine (3 - 2024-25 season) 11/25/2022   Hemoglobin A1C  10/09/2023   Flu Shot  10/25/2023   DEXA scan (bone density measurement)  04/06/2024   Yearly kidney function blood test for diabetes  04/10/2024   Mammogram  04/14/2024   Medicare Annual Wellness Visit  08/06/2024   DTaP/Tdap/Td vaccine (3 - Td or Tdap) 02/07/2030   Colon Cancer Screening  04/05/2031   Pneumonia Vaccine   Completed   Hepatitis C Screening  Completed   Zoster (Shingles) Vaccine  Completed   HPV Vaccine  Aged Out   Meningitis B Vaccine  Aged Out    Advanced directives: (Declined) Advance directive discussed with you today. Even though you declined this today, please call our office should you change your mind, and we can give you the proper paperwork for you to fill out. Advance Care Planning is important because it:  [x]  Makes sure you receive the medical care that is consistent with your values, goals, and preferences  [x]  It provides guidance to your family and loved ones and reduces their decisional burden about whether or not they are making the right decisions based on your wishes.  Follow the link provided in your after visit summary or read over the paperwork we have mailed to you to help you started getting your Advance Directives in place. If you need assistance in completing these, please reach out to us  so that we can help you!  See attachments for Preventive Care and Fall Prevention Tips.   Understanding Your Risk for Falls  Millions of people have serious injuries from falls each year. It is important to understand your risk of falling. Talk with your health care provider about your risk and what you can do to lower  it. If you do have a serious fall, make sure to tell your provider. Falling once raises your risk of falling again. How can falls affect me? Serious injuries from falls are common. These include: Broken bones, such as hip fractures. Head injuries, such as traumatic brain injuries (TBI) or concussions. A fear of falling can cause you to avoid activities and stay at home. This can make your muscles weaker and raise your risk for a fall. What can increase my risk? There are a number of risk factors that increase your risk for falling. The more risk factors you have, the higher your risk of falling. Serious injuries from a fall happen most often to people who are older  than 69 years old. Teenagers and young adults ages 56-29 are also at higher risk. Common risk factors include: Weakness in the lower body. Being generally weak or confused due to long-term (chronic) illness. Dizziness or balance problems. Poor vision. Medicines that cause dizziness or drowsiness. These may include: Medicines for your blood pressure, heart, anxiety, insomnia, or swelling (edema). Pain medicines. Muscle relaxants. Other risk factors include: Drinking alcohol. Having had a fall in the past. Having foot pain or wearing improper footwear. Working at a dangerous job. Having any of the following in your home: Tripping hazards, such as floor clutter or loose rugs. Poor lighting. Pets. Having dementia or memory loss. What actions can I take to lower my risk of falling?     Physical activity Stay physically fit. Do strength and balance exercises. Consider taking a regular class to build strength and balance. Yoga and tai chi are good options. Vision Have your eyes checked every year and your prescription for glasses or contacts updated as needed. Shoes and walking aids Wear non-skid shoes. Wear shoes that have rubber soles and low heels. Do not wear high heels. Do not walk around the house in socks or slippers. Use a cane or walker as told by your provider. Home safety Attach secure railings on both sides of your stairs. Install grab bars for your bathtub, shower, and toilet. Use a non-skid mat in your bathtub or shower. Attach bath mats securely with double-sided, non-slip rug tape. Use good lighting in all rooms. Keep a flashlight near your bed. Make sure there is a clear path from your bed to the bathroom. Use night-lights. Do not use throw rugs. Make sure all carpeting is taped or tacked down securely. Remove all clutter from walkways and stairways, including extension cords. Repair uneven or broken steps and floors. Avoid walking on icy or slippery surfaces. Walk  on the grass instead of on icy or slick sidewalks. Use ice melter to get rid of ice on walkways in the winter. Use a cordless phone. Questions to ask your health care provider Can you help me check my risk for a fall? Do any of my medicines make me more likely to fall? Should I take a vitamin D  supplement? What exercises can I do to improve my strength and balance? Should I make an appointment to have my vision checked? Do I need a bone density test to check for weak bones (osteoporosis)? Would it help to use a cane or a walker? Where to find more information Centers for Disease Control and Prevention, STEADI: TonerPromos.no Community-Based Fall Prevention Programs: TonerPromos.no [employer] on Aging: BaseRingTones.pl Contact a health care provider if: You fall at home. You are afraid of falling at home. You feel weak, drowsy, or dizzy. This information is not intended to  replace advice given to you by your health care provider. Make sure you discuss any questions you have with your health care provider. Document Revised: 11/13/2021 Document Reviewed: 11/13/2021 Elsevier Patient Education  2024 [employer]. Understanding Your Risk for Falls Millions of people have serious injuries from falls each year. It is important to understand your risk of falling. Talk with your health care provider about your risk and what you can do to lower it. If you do have a serious fall, make sure to tell your provider. Falling once raises your risk of falling again. How can falls affect me? Serious injuries from falls are common. These include: Broken bones, such as hip fractures. Head injuries, such as traumatic brain injuries (TBI) or concussions. A fear of falling can cause you to avoid activities and stay at home. This can make your muscles weaker and raise your risk for a fall. What can increase my risk? There are a number of risk factors that increase your risk for falling. The more risk factors you have, the  higher your risk of falling. Serious injuries from a fall happen most often to people who are older than 69 years old. Teenagers and young adults ages 98-29 are also at higher risk. Common risk factors include: Weakness in the lower body. Being generally weak or confused due to long-term (chronic) illness. Dizziness or balance problems. Poor vision. Medicines that cause dizziness or drowsiness. These may include: Medicines for your blood pressure, heart, anxiety, insomnia, or swelling (edema). Pain medicines. Muscle relaxants. Other risk factors include: Drinking alcohol. Having had a fall in the past. Having foot pain or wearing improper footwear. Working at a dangerous job. Having any of the following in your home: Tripping hazards, such as floor clutter or loose rugs. Poor lighting. Pets. Having dementia or memory loss. What actions can I take to lower my risk of falling?     Physical activity Stay physically fit. Do strength and balance exercises. Consider taking a regular class to build strength and balance. Yoga and tai chi are good options. Vision Have your eyes checked every year and your prescription for glasses or contacts updated as needed. Shoes and walking aids Wear non-skid shoes. Wear shoes that have rubber soles and low heels. Do not wear high heels. Do not walk around the house in socks or slippers. Use a cane or walker as told by your provider. Home safety Attach secure railings on both sides of your stairs. Install grab bars for your bathtub, shower, and toilet. Use a non-skid mat in your bathtub or shower. Attach bath mats securely with double-sided, non-slip rug tape. Use good lighting in all rooms. Keep a flashlight near your bed. Make sure there is a clear path from your bed to the bathroom. Use night-lights. Do not use throw rugs. Make sure all carpeting is taped or tacked down securely. Remove all clutter from walkways and stairways, including  extension cords. Repair uneven or broken steps and floors. Avoid walking on icy or slippery surfaces. Walk on the grass instead of on icy or slick sidewalks. Use ice melter to get rid of ice on walkways in the winter. Use a cordless phone. Questions to ask your health care provider Can you help me check my risk for a fall? Do any of my medicines make me more likely to fall? Should I take a vitamin D  supplement? What exercises can I do to improve my strength and balance? Should I make an appointment to have my vision  checked? Do I need a bone density test to check for weak bones (osteoporosis)? Would it help to use a cane or a walker? Where to find more information Centers for Disease Control and Prevention, STEADI: TonerPromos.no Community-Based Fall Prevention Programs: TonerPromos.no [employer] on Aging: BaseRingTones.pl Contact a health care provider if: You fall at home. You are afraid of falling at home. You feel weak, drowsy, or dizzy. This information is not intended to replace advice given to you by your health care provider. Make sure you discuss any questions you have with your health care provider. Document Revised: 11/13/2021 Document Reviewed: 11/13/2021 Elsevier Patient Education  2024 [employer].

## 2023-08-07 NOTE — Progress Notes (Signed)
 Subjective:   Paula Best is a 68 y.o. who presents for a Medicare Wellness preventive visit.  As a reminder, Annual Wellness Visits don't include a physical exam, and some assessments may be limited, especially if this visit is performed virtually. We may recommend an in-person visit if needed.  Visit Complete: Virtual I connected with  [name] on 08/07/23 by a audio enabled telemedicine application and verified that I am speaking with the correct person using two identifiers.  Patient Location: Home  Provider Location: Home Office  I discussed the limitations of evaluation and management by telemedicine. The patient expressed understanding and agreed to proceed.  Vital Signs: Because this visit was a virtual/telehealth visit, some criteria may be missing or patient reported. Any vitals not documented were not able to be obtained and vitals that have been documented are patient reported.  VideoDeclined- This patient declined Librarian, academic. Therefore the visit was completed with audio only.  Persons Participating in Visit: Patient.  AWV Questionnaire: No: Patient Medicare AWV questionnaire was not completed prior to this visit.  Cardiac Risk Factors include: advanced age (>82men, >11 women);diabetes mellitus;dyslipidemia;obesity (BMI >30kg/m2);hypertension;sedentary lifestyle     Objective:     Today's Vitals   08/07/23 1520 08/07/23 1522  Weight: 178 lb (80.7 kg)   Height: 5\' 2"  (1.575 m)   PainSc:  2    Body mass index is 32.56 kg/m.     08/07/2023    3:55 PM 11/10/2021   12:09 PM 11/06/2021    3:35 PM 10/27/2021    7:48 AM 08/24/2021    7:48 AM 04/04/2021   12:13 PM  Advanced Directives  Does Patient Have a Medical Advance Directive? No No No No No No  Would patient like information on creating a medical advance directive? No - Patient declined No - Patient declined No - Patient declined No - Patient declined  No - Patient  declined    Current Medications (verified) Outpatient Encounter Medications as of 08/07/2023  Medication Sig   acetaminophen (TYLENOL) 650 MG CR tablet Take 650 mg by mouth every 8 (eight) hours as needed for pain.   amLODipine  (NORVASC ) 10 MG tablet TAKE 1 TABLET(10 MG) BY MOUTH DAILY   hydrOXYzine  (ATARAX ) 10 MG tablet Take one tablet at bedtime for sleep   meloxicam (MOBIC) 7.5 MG tablet Take 1 tablet (7.5 mg total) by mouth daily.   metFORMIN  (GLUCOPHAGE ) 500 MG tablet Take 1 tablet (500 mg total) by mouth daily with breakfast.   mupirocin  ointment (BACTROBAN ) 2 % Apply 1 Application topically 2 (two) times daily. Apply twice daily to sore for 5 days , then as needed   rosuvastatin  (CRESTOR ) 10 MG tablet Take 1 tablet (10 mg total) by mouth daily.   triamcinolone  cream (KENALOG ) 0.1 % Apply 1 Application topically 2 (two) times daily.   triamterene -hydrochlorothiazide  (MAXZIDE-25) 37.5-25 MG tablet Take one and a half tablets by mouth once daily for blood pressure   No facility-administered encounter medications on file as of 08/07/2023.    Allergies (verified) Patient has no known allergies.   History: Past Medical History:  Diagnosis Date   Anemia, iron deficiency    Heart murmur, systolic    Hypertension    Lipoma    LVH (left ventricular hypertrophy)    mild- Echo 3/11   Menorrhagia    Obesity    Tobacco user    Vitamin D  deficiency    Past Surgical History:  Procedure Laterality Date  APPENDECTOMY     CATARACT EXTRACTION W/PHACO Right 10/27/2021   Procedure: CATARACT EXTRACTION PHACO AND INTRAOCULAR LENS PLACEMENT (IOC);  Surgeon: Tarri Farm, MD;  Location: [facility];  Service: Ophthalmology;  Laterality: Right;  CDE: 4.87   CATARACT EXTRACTION W/PHACO Left 11/10/2021   Procedure: CATARACT EXTRACTION PHACO AND INTRAOCULAR LENS PLACEMENT (IOC);  Surgeon: Tarri Farm, MD;  Location: [facility];  Service: Ophthalmology;  Laterality: Left;  CDE: 4.19   COLONOSCOPY WITH  PROPOFOL  N/A 04/04/2021   Procedure: COLONOSCOPY WITH PROPOFOL ;  Surgeon: Vinetta Greening, DO;  Location: [facility];  Service: Endoscopy;  Laterality: N/A;  1:30 / ASA II   DENTAL SURGERY     LIPOMA EXCISION  May 29, Dr Larrie Po   POLYPECTOMY  04/04/2021   Procedure: POLYPECTOMY;  Surgeon: Vinetta Greening, DO;  Location: [facility];  Service: Endoscopy;;   TUBAL LIGATION  26   Family History  Problem Relation Age of Onset   Kidney disease Mother    Hypertension Brother    Asthma Sister    Social History   Socioeconomic History   Marital status: Divorced    Spouse name: Not on file   Number of children: 3   Years of education: 11th    Highest education level: Not on file  Occupational History   Occupation: Best boy for [employer]   Tobacco Use   Smoking status: Former    Current packs/day: 0.00    Types: Cigarettes    Quit date: 12/31/2017    Years since quitting: 5.6   Smokeless tobacco: Never  Vaping Use   Vaping status: Never Used  Substance and Sexual Activity   Alcohol use: No    Alcohol/week: 0.0 standard drinks of alcohol   Drug use: No   Sexual activity: Yes    Partners: Male  Other Topics Concern   Not on file  Social History Narrative   Not on file   Social Drivers of Health   Financial Resource Strain: Low Risk  (08/07/2023)   Overall Financial Resource Strain (CARDIA)    Difficulty of Paying Living Expenses: Not hard at all  Food Insecurity: No Food Insecurity (08/07/2023)   Hunger Vital Sign    Worried About Running Out of Food in the Last Year: Never true    Ran Out of Food in the Last Year: Never true  Transportation Needs: No Transportation Needs (08/07/2023)   PRAPARE - Administrator, Civil Service (Medical): No    Lack of Transportation (Non-Medical): No  Physical Activity: Inactive (08/07/2023)   Exercise Vital Sign    Days of Exercise per Week: 0 days    Minutes of Exercise per Session: 0 min  Stress: No Stress  Concern Present (08/07/2023)   [employer] of Occupational Health - Occupational Stress Questionnaire    Feeling of Stress : Not at all  Social Connections: Socially Isolated (08/07/2023)   Social Connection and Isolation Panel [NHANES]    Frequency of Communication with Friends and Family: More than three times a week    Frequency of Social Gatherings with Friends and Family: Once a week    Attends Religious Services: Never    Database administrator or Organizations: No    Attends Banker Meetings: Never    Marital Status: Widowed    Tobacco Counseling Counseling given: Yes    Clinical Intake:  Pre-visit preparation completed: Yes  Pain : 0-10 Pain Score: 2  Pain Type: Chronic pain Pain  Location: Knee Pain Orientation: Left Pain Descriptors / Indicators: Aching, Sore Pain Onset: More than a month ago Pain Frequency: Intermittent     BMI - recorded: 32.56 Nutritional Status: BMI > 30  Obese Nutritional Risks: None Diabetes: Yes CBG done?: No Did pt. bring in CBG monitor from home?: No  Lab Results  Component Value Date   HGBA1C 6.6 (H) 04/11/2023   HGBA1C 6.4 (H) 10/11/2022   HGBA1C 6.6 (H) 04/06/2022     How often do you need to have someone help you when you read instructions, pamphlets, or other written materials from your doctor or pharmacy?: 1 - Never  Interpreter Needed?: No  Information entered by :: Sally Crazier CMA   Activities of Daily Living     08/07/2023    3:48 PM  In your present state of health, do you have any difficulty performing the following activities:  Hearing? 0  Vision? 0  Difficulty concentrating or making decisions? 0  Walking or climbing stairs? 0  Dressing or bathing? 0  Doing errands, shopping? 0  Preparing Food and eating ? N  Using the Toilet? N  In the past six months, have you accidently leaked urine? N  Do you have problems with loss of bowel control? N  Managing your Medications? N  Managing your  Finances? N  Housekeeping or managing your Housekeeping? N    Patient Care Team: Towanda Fret, MD as PCP - General  Indicate any recent Medical Services you may have received from other than [facility] providers in the past year (date may be approximate).     Assessment:    This is a routine wellness examination for Cherene.  Hearing/Vision screen Hearing Screening - Comments:: Patient denies any hearing difficulties.   Vision Screening - Comments:: Patient is not up to date with yearly eye exams. She sees [facility] in [place] She will call to make an appointment.    Goals Addressed             This Visit's Progress    Patient Stated       I'm working towards retirement       Depression Screen     08/07/2023    3:56 PM 04/10/2023    3:50 PM 12/13/2022    3:58 PM 10/05/2022    2:58 PM 04/06/2022    3:02 PM 10/11/2020    3:47 PM 08/23/2020    3:28 PM  PHQ 2/9 Scores  PHQ - 2 Score 0 0 2 3 0 0 0  PHQ- 9 Score 0  11 5       Fall Risk     08/07/2023    3:50 PM 04/10/2023    3:50 PM 12/13/2022    3:58 PM 10/05/2022    2:57 PM 04/06/2022    3:01 PM  Fall Risk   Falls in the past year? 0 0 1 1 1   Number falls in past yr: 0 0 1 1 1   Injury with Fall? 0 0 1 0 0  Risk for fall due to : No Fall Risks No Fall Risks History of fall(s);Impaired balance/gait History of fall(s) No Fall Risks  Follow up Falls prevention discussed;Falls evaluation completed;Education provided Falls evaluation completed Falls evaluation completed Falls evaluation completed Falls evaluation completed    MEDICARE RISK AT HOME:  Medicare Risk at Home Any stairs in or around the home?: Yes If so, are there any without handrails?: No Home free of loose throw rugs in walkways,  pet beds, electrical cords, etc?: Yes Adequate lighting in your home to reduce risk of falls?: Yes Life alert?: No Use of a cane, walker or w/c?: No Grab bars in the bathroom?: Yes Shower chair or bench in shower?:  No Elevated toilet seat or a handicapped toilet?: Yes  TIMED UP AND GO:  Was the test performed?  No  Cognitive Function: 6CIT completed        08/07/2023    3:54 PM  6CIT Screen  What Year? 0 points  What month? 0 points  What time? 0 points  Count back from 20 0 points  Months in reverse 0 points  Repeat phrase 0 points  Total Score 0 points    Immunizations Immunization History  Administered Date(s) Administered   Fluad Quad(high Dose 65+) 04/06/2022   Fluad Trivalent(High Dose 65+) 12/13/2022   Influenza,inj,Quad PF,6+ Mos 01/12/2021   PFIZER(Purple Top)SARS-COV-2 Vaccination 12/06/2019, 12/27/2019   PNEUMOCOCCAL CONJUGATE-20 01/12/2021   Pneumococcal Conjugate-13 11/02/2013   Pneumococcal Polysaccharide-23 02/27/2008   Td 06/30/2009   Tdap 02/08/2020   Zoster Recombinant(Shingrix) 02/09/2021, 04/27/2021    Screening Tests Health Maintenance  Topic Date Due   FOOT EXAM  Never done   OPHTHALMOLOGY EXAM  Never done   Diabetic kidney evaluation - Urine ACR  Never done   COVID-19 Vaccine (3 - 2024-25 season) 11/25/2022   HEMOGLOBIN A1C  10/09/2023   INFLUENZA VACCINE  10/25/2023   DEXA SCAN  04/06/2024   Diabetic kidney evaluation - eGFR measurement  04/10/2024   MAMMOGRAM  04/14/2024   Medicare Annual Wellness (AWV)  08/06/2024   DTaP/Tdap/Td (3 - Td or Tdap) 02/07/2030   Colonoscopy  04/05/2031   Pneumonia Vaccine 52+ Years old  Completed   Hepatitis C Screening  Completed   Zoster Vaccines- Shingrix  Completed   HPV VACCINES  Aged Out   Meningococcal B Vaccine  Aged Out    Health Maintenance  Health Maintenance Due  Topic Date Due   FOOT EXAM  Never done   OPHTHALMOLOGY EXAM  Never done   Diabetic kidney evaluation - Urine ACR  Never done   COVID-19 Vaccine (3 - 2024-25 season) 11/25/2022   Health Maintenance Items Addressed: Labs Ordered:, UACR (Urine Albumin:Creatinine Ratio) Patient aware she is past due for a DM eye exam. She states she will  call to schedule that appointment. Message sent to provider regarding DM foot exam that is due.   Additional Screening:  Vision Screening: Recommended annual ophthalmology exams for early detection of glaucoma and other disorders of the eye.  Dental Screening: Recommended annual dental exams for proper oral hygiene  Community Resource Referral / Chronic Care Management: CRR required this visit?  No   CCM required this visit?  No   Plan:    I have personally reviewed and noted the following in the patient's chart:   Medical and social history Use of alcohol, tobacco or illicit drugs  Current medications and supplements including opioid prescriptions. Patient is not currently taking opioid prescriptions. Functional ability and status Nutritional status Physical activity Advanced directives List of other physicians Hospitalizations, surgeries, and ER visits in previous 12 months Vitals Screenings to include cognitive, depression, and falls Referrals and appointments  In addition, I have reviewed and discussed with patient certain preventive protocols, quality metrics, and best practice recommendations. A written personalized care plan for preventive services as well as general preventive health recommendations were provided to patient.   Averie Hornbaker, CMA   08/07/2023   After  Visit Summary: (MyChart) Due to this being a telephonic visit, the after visit summary with patients personalized plan was offered to patient via MyChart   Notes: Please refer to Routing Comments.

## 2023-08-14 ENCOUNTER — Telehealth: Payer: Self-pay

## 2023-08-14 NOTE — Telephone Encounter (Signed)
 Copied from CRM [id number]. Topic: Clinical - Medical Advice >> Aug 14, 2023  8:58 AM Essie A wrote: Reason for CRM: Patient is showing symptoms of scratchy throat, body aches and congestion.  She wants to know if something can be prescribed.  Please return her call at [phone].

## 2023-08-14 NOTE — Telephone Encounter (Signed)
Pt informed

## 2023-08-15 ENCOUNTER — Ambulatory Visit
Admission: EM | Admit: 2023-08-15 | Discharge: 2023-08-15 | Disposition: A | Discharge status: Home / Self Care | Attending: Nurse Practitioner | Admitting: Nurse Practitioner

## 2023-08-15 DIAGNOSIS — U071 COVID-19: Secondary | ICD-10-CM | POA: Diagnosis not present

## 2023-08-15 LAB — POC SARS CORONAVIRUS 2 AG -  ED: SARS Coronavirus 2 Ag: POSITIVE — AB

## 2023-08-15 MED ORDER — BENZONATATE 100 MG PO CAPS: 100.0000 mg | ORAL_CAPSULE | Freq: Three times a day (TID) | ORAL | 0 refills | Status: DC | PRN

## 2023-08-15 NOTE — Discharge Instructions (Signed)
 You have a viral upper respiratory infection.  Symptoms should improve over the next week to 10 days.  If you develop chest pain or shortness of breath, go to the emergency room.  You tested positive for COVID-19 today.  Some things that can make you feel better are: - Increased rest - Increasing fluid with water /sugar free electrolytes - Acetaminophen as needed for fever/pain - Salt water  gargling, chloraseptic spray and throat lozenges - OTC guaifenesin (Mucinex) 600 mg twice daily - Saline sinus flushes or a neti pot - Humidifying the air -Tessalon Perles every 8 hours as needed for dry cough

## 2023-08-15 NOTE — ED Provider Notes (Signed)
 [facility]    CSN: [id number] Arrival date & time: 08/15/23  1435      History   Chief Complaint No chief complaint on file.   HPI Paula Best is a 68 y.o. female.   Patient presents today with 2-day history of bodyaches and chills, congested cough, runny and stuffy nose, sore throat that has not improved, headache, bilateral ear fullness, diarrhea, decreased appetite, and fatigue.  No fever, shortness of breath or chest pain, abdominal pain, nausea, vomiting, or ear pain.  Has taken over-the-counter Tylenol and lemon/honey for symptoms with minimal temporary improvement.  No known sick contacts.    Past Medical History:  Diagnosis Date   Anemia, iron deficiency    Heart murmur, systolic    Hypertension    Lipoma    LVH (left ventricular hypertrophy)    mild- Echo 3/11   Menorrhagia    Obesity    Tobacco user    Vitamin D  deficiency     Patient Active Problem List   Diagnosis Date Noted   Rash and nonspecific skin eruption 12/14/2022   Osteoarthritis, generalized 06/15/2021   Insomnia 04/07/2020   Knee pain, bilateral 11/23/2019   Hyperlipidemia 06/24/2019   Vision loss 09/14/2018   Endometrial polyp 02/26/2018   Obesity (BMI 30.0-34.9) 11/02/2013   Abnormal endometrial ultrasound 11/02/2013   Ovarian cyst, needs rept uS  in8/2014 11/21/2011   Cervical neck pain with evidence of disc disease 10/24/2011   Lumbar pain with radiation down both legs 10/24/2011   Type 2 diabetes mellitus with other specified complication (HCC) 09/02/2011   Carotid bruit 05/22/2011   Vitamin D  deficiency 06/30/2009   HEART MURMUR, SYSTOLIC 05/31/2009   Iron deficiency anemia, unspecified 09/12/2007   Asthma, mild intermittent 09/12/2007   LIPOMA 08/29/2007   Essential hypertension 08/29/2007    Past Surgical History:  Procedure Laterality Date   APPENDECTOMY     CATARACT EXTRACTION W/PHACO Right 10/27/2021   Procedure: CATARACT EXTRACTION PHACO AND INTRAOCULAR  LENS PLACEMENT (IOC);  Surgeon: Tarri Farm, MD;  Location: [facility];  Service: Ophthalmology;  Laterality: Right;  CDE: 4.87   CATARACT EXTRACTION W/PHACO Left 11/10/2021   Procedure: CATARACT EXTRACTION PHACO AND INTRAOCULAR LENS PLACEMENT (IOC);  Surgeon: Tarri Farm, MD;  Location: [facility];  Service: Ophthalmology;  Laterality: Left;  CDE: 4.19   COLONOSCOPY WITH PROPOFOL  N/A 04/04/2021   Procedure: COLONOSCOPY WITH PROPOFOL ;  Surgeon: Vinetta Greening, DO;  Location[facility];  Service: Endoscopy;  Laterality: N/A;  1:30 / ASA II   DENTAL SURGERY     LIPOMA EXCISION  May 29, Dr Larrie Po   POLYPECTOMY  04/04/2021   Procedure: POLYPECTOMY;  Surgeon: Vinetta Greening, DO;  [facility]: [facility];  Service: Endoscopy;;   TUBAL LIGATION  26    OB History   No obstetric history on file.      Home Medications    Prior to Admission medications   Medication Sig Start Date End Date Taking? Authorizing Provider  benzonatate (TESSALON) 100 MG capsule Take 1 capsule (100 mg total) by mouth 3 (three) times daily as needed for cough. Do not take with alcohol or while operating or driving heavy machinery 7/82/95  Yes Thena Fireman A, NP  acetaminophen (TYLENOL) 650 MG CR tablet Take 650 mg by mouth every 8 (eight) hours as needed for pain.    [provider]  amLODipine  (NORVASC ) 10 MG tablet TAKE 1 TABLET(10 MG) BY MOUTH DAILY 05/20/23   Towanda Fret,  MD  hydrOXYzine  (ATARAX ) 10 MG tablet Take one tablet at bedtime for sleep 04/10/23   Towanda Fret, MD  meloxicam  (MOBIC ) 7.5 MG tablet Take 1 tablet (7.5 mg total) by mouth daily. 08/02/23   Leath-Warren, Belen Bowers, NP  metFORMIN  (GLUCOPHAGE ) 500 MG tablet Take 1 tablet (500 mg total) by mouth daily with breakfast. 04/12/23   Towanda Fret, MD  mupirocin  ointment (BACTROBAN ) 2 % Apply 1 Application topically 2 (two) times daily. Apply twice daily to sore for 5 days , then as needed 04/10/23   Towanda Fret, MD   rosuvastatin  (CRESTOR ) 10 MG tablet Take 1 tablet (10 mg total) by mouth daily. 04/12/23   Towanda Fret, MD  triamcinolone  cream (KENALOG ) 0.1 % Apply 1 Application topically 2 (two) times daily. 12/13/22   Towanda Fret, MD  triamterene -hydrochlorothiazide  (MAXZIDE-25) 37.5-25 MG tablet Take one and a half tablets by mouth once daily for blood pressure 10/05/22   Towanda Fret, MD    Family History Family History  Problem Relation Age of Onset   Kidney disease Mother    Hypertension Brother    Asthma Sister     Social History Social History   Tobacco Use   Smoking status: Former    Current packs/day: 0.00    Types: Cigarettes    Quit date: 12/31/2017    Years since quitting: 5.6   Smokeless tobacco: Never  Vaping Use   Vaping status: Never Used  Substance Use Topics   Alcohol use: No    Alcohol/week: 0.0 standard drinks of alcohol   Drug use: No     Allergies   Patient has no known allergies.   Review of Systems Review of Systems Per HPI  Physical Exam Triage Vital Signs ED Triage Vitals  Encounter Vitals Group     BP 08/15/23 1444 138/80     Systolic BP Percentile --      Diastolic BP Percentile --      Pulse Rate 08/15/23 1444 80     Resp 08/15/23 1444 20     Temp 08/15/23 1444 98.5 F (36.9 C)     Temp Source 08/15/23 1444 Oral     SpO2 08/15/23 1444 90 %     Weight --      Height --      Head Circumference --      Peak Flow --      Pain Score 08/15/23 1446 0     Pain Loc --      Pain Education --      Exclude from Growth Chart --    No data found.  Updated Vital Signs BP 138/80 (BP Location: Right Arm)   Pulse 80   Temp 98.5 F (36.9 C) (Oral)   Resp 20   LMP 07/25/2011   SpO2 90%   SpO2 recheck: 94% on room air  Visual Acuity Right Eye Distance:   Left Eye Distance:   Bilateral Distance:    Right Eye Near:   Left Eye Near:    Bilateral Near:     Physical Exam Vitals and nursing note reviewed.  Constitutional:       General: She is not in acute distress.    Appearance: Normal appearance. She is not ill-appearing or toxic-appearing.  HENT:     Head: Normocephalic and atraumatic.     Right Ear: Tympanic membrane, ear canal and external ear normal.     Left Ear: Tympanic membrane, ear canal and external  ear normal.     Nose: Congestion present. No rhinorrhea.     Mouth/Throat:     Mouth: Mucous membranes are moist.     Pharynx: Oropharynx is clear. No oropharyngeal exudate or posterior oropharyngeal erythema.  Eyes:     General: No scleral icterus.    Extraocular Movements: Extraocular movements intact.  Cardiovascular:     Rate and Rhythm: Normal rate and regular rhythm.  Pulmonary:     Effort: Pulmonary effort is normal. No respiratory distress.     Breath sounds: Normal breath sounds. No wheezing, rhonchi or rales.  Musculoskeletal:     Cervical back: Normal range of motion and neck supple.  Lymphadenopathy:     Cervical: No cervical adenopathy.  Skin:    General: Skin is warm and dry.     Coloration: Skin is not jaundiced or pale.     Findings: No erythema or rash.  Neurological:     Mental Status: She is alert and oriented to person, place, and time.  Psychiatric:        Behavior: Behavior is cooperative.      UC Treatments / Results  Labs (all labs ordered are listed, but only abnormal results are displayed) Labs Reviewed  POC SARS CORONAVIRUS 2 AG -  ED - Abnormal; Notable for the following components:      Result Value   SARS Coronavirus 2 Ag Positive (*)    All other components within normal limits    EKG   Radiology No results found.  Procedures Procedures (including critical care time)  Medications Ordered in UC Medications - No data to display  Initial Impression / Assessment and Plan / UC Course  I have reviewed the triage vital signs and the nursing notes.  Pertinent labs & imaging results that were available during my care of the patient were reviewed  by me and considered in my medical decision making (see chart for details).   Patient is well-appearing, normotensive, afebrile, not tachycardic, not tachypneic, oxygenating well on room air.   1. COVID-19 Vitals and examination are reassuring Offered Paxlovid, patient declines for now Supportive care discussed; start guaifenesin and cough suppressant medication ER and return precautions discussed Work excuse provided   The patient was given the opportunity to ask questions.  All questions answered to their satisfaction.  The patient is in agreement to this plan.   Final Clinical Impressions(s) / UC Diagnoses   Final diagnoses:  COVID-19     Discharge Instructions      You have a viral upper respiratory infection.  Symptoms should improve over the next week to 10 days.  If you develop chest pain or shortness of breath, go to the emergency room.  You tested positive for COVID-19 today.  Some things that can make you feel better are: - Increased rest - Increasing fluid with water /sugar free electrolytes - Acetaminophen as needed for fever/pain - Salt water  gargling, chloraseptic spray and throat lozenges - OTC guaifenesin (Mucinex) 600 mg twice daily - Saline sinus flushes or a neti pot - Humidifying the air -Tessalon Perles every 8 hours as needed for dry cough    ED Prescriptions     Medication Sig Dispense Auth. Provider   benzonatate (TESSALON) 100 MG capsule Take 1 capsule (100 mg total) by mouth 3 (three) times daily as needed for cough. Do not take with alcohol or while operating or driving heavy machinery 21 capsule Wilhemena Harbour, NP      PDMP not reviewed  this encounter.   Wilhemena Harbour, NP 08/15/23 [phone]

## 2023-08-15 NOTE — ED Triage Notes (Signed)
 Pt reports sore throat, cough,congestion, sinus, drainage, headache x 2 days

## 2023-09-12 ENCOUNTER — Encounter: Payer: Self-pay | Admitting: Family Medicine

## 2023-09-12 ENCOUNTER — Ambulatory Visit: Payer: Self-pay | Admitting: Family Medicine

## 2023-09-12 VITALS — BP 132/82 | HR 78 | Resp 18 | Ht 62.0 in | Wt 183.0 lb

## 2023-09-12 DIAGNOSIS — Z0001 Encounter for general adult medical examination with abnormal findings: Secondary | ICD-10-CM

## 2023-09-12 DIAGNOSIS — I10 Essential (primary) hypertension: Secondary | ICD-10-CM | POA: Diagnosis not present

## 2023-09-12 DIAGNOSIS — E7849 Other hyperlipidemia: Secondary | ICD-10-CM | POA: Diagnosis not present

## 2023-09-12 DIAGNOSIS — E1169 Type 2 diabetes mellitus with other specified complication: Secondary | ICD-10-CM

## 2023-09-12 NOTE — Patient Instructions (Addendum)
 F/U in 4 months, call if you need me sooner  Please schedule diabetic retinal screen at checkout  Labs today, lipid, cmp and EGFR, HBA1C, urine ACR  Recommend that you take Vit D3, 2000 units daily, this is oTC  It is important that you exercise regularly at least 30 minutes 5 times a week. If you develop chest pain, have severe difficulty breathing, or feel very tired, stop exercising immediately and seek medical attention   Think about what you will eat, plan ahead. Choose  clean, green, fresh or frozen over canned, processed or packaged foods which are more sugary, salty and fatty. 70 to 75% of food eaten should be vegetables and fruit. Three meals at set times with snacks allowed between meals, but they must be fruit or vegetables. Aim to eat over a 12 hour period , example 7 am to 7 pm, and STOP after  your last meal of the day. Drink water ,generally about 64 ounces per day, no other drink is as healthy. Fruit juice is best enjoyed in a healthy way, by EATING the fruit.  Thanks for choosing [facility], we consider it a privelige to serve you.

## 2023-09-12 NOTE — Assessment & Plan Note (Signed)
 Annual exam as documented. . Immunization and cancer screening needs are specifically addressed at this visit.

## 2023-09-12 NOTE — Progress Notes (Signed)
    Paula Best     MRN: [id number]      DOB: [birth year]  Chief Complaint  Patient presents with   Annual Exam    Cpe     HPI: Patient is in for annual physical exam. No other health concerns are expressed or addressed at the visit. Recent labs,  are reviewed. Immunization is reviewed , and  updated if needed.   PE: BP 132/82   Pulse 78   Resp 18   Ht 5' 2 (1.575 m)   Wt 183 lb (83 kg)   LMP 07/25/2011   SpO2 96%   BMI 33.47 kg/m   Pleasant  female, alert and oriented x 3, in no cardio-pulmonary distress. Afebrile. HEENT No facial trauma or asymetry. Sinuses non tender.  Extra occullar muscles intact.. External ears normal, . Neck: supple, no adenopathy,JVD or thyromegaly.No bruits.  Chest: Clear to ascultation bilaterally.No crackles or wheezes. Non tender to palpation   Cardiovascular system; Heart sounds normal,  S1 and  S2 ,no S3.  No murmur, or thrill. Apical beat not displaced Peripheral pulses normal.  Abdomen: Soft, non tender, no organomegaly or masses. No bruits. Bowel sounds normal. No guarding, tenderness or rebound.   Full ROM of spine, hips , shoulders and knees. No deformity ,swelling or crepitus noted. No muscle wasting or atrophy.   Neurologic: Cranial nerves 2 to 12 intact. Power, tone ,sensation and reflexes normal throughout. No disturbance in gait. No tremor.  Skin: Intact, no ulceration, erythema , scaling or rash noted. Pigmentation normal throughout  Psych; Normal mood and affect. Judgement and concentration normal   Assessment & Plan:  Encounter for Medicare annual examination with abnormal findings Annual exam as documented.  Immunization and cancer screening needs are specifically addressed at this visit.

## 2023-09-13 ENCOUNTER — Ambulatory Visit: Payer: Self-pay | Admitting: Family Medicine

## 2023-09-14 LAB — LIPID PANEL
Chol/HDL Ratio: 3.3 ratio (ref 0.0–4.4)
Cholesterol, Total: 197 mg/dL (ref 100–199)
HDL: 60 mg/dL (ref >39)
LDL Chol Calc (NIH): 116 mg/dL — ABNORMAL HIGH (ref 0–99)
Triglycerides: 116 mg/dL (ref 0–149)
VLDL Cholesterol Cal: 21 mg/dL (ref 5–40)

## 2023-09-14 LAB — CMP14+EGFR
ALT: 15 IU/L (ref 0–32)
AST: 15 IU/L (ref 0–40)
Albumin: 4.3 g/dL (ref 3.9–4.9)
Alkaline Phosphatase: 80 IU/L (ref 44–121)
BUN/Creatinine Ratio: 18 (ref 12–28)
BUN: 17 mg/dL (ref 8–27)
Bilirubin Total: <0.2 mg/dL (ref 0.0–1.2)
CO2: 23 mmol/L (ref 20–29)
Calcium: 9.5 mg/dL (ref 8.7–10.3)
Chloride: 102 mmol/L (ref 96–106)
Creatinine, Ser: 0.96 mg/dL (ref 0.57–1.00)
Globulin, Total: 2.7 g/dL (ref 1.5–4.5)
Glucose: 85 mg/dL (ref 70–99)
Potassium: 3.8 mmol/L (ref 3.5–5.2)
Sodium: 142 mmol/L (ref 134–144)
Total Protein: 7 g/dL (ref 6.0–8.5)
eGFR: 65 mL/min/{1.73_m2} (ref >59)

## 2023-09-14 LAB — HEMOGLOBIN A1C
Est. average glucose Bld gHb Est-mCnc: 140 mg/dL
Hgb A1c MFr Bld: 6.5 % — ABNORMAL HIGH (ref 4.8–5.6)

## 2023-09-14 LAB — MICROALBUMIN / CREATININE URINE RATIO
Creatinine, Urine: 179.6 mg/dL
Microalb/Creat Ratio: 4 mg/g{creat} (ref 0–29)
Microalbumin, Urine: 7.8 ug/mL

## 2023-09-19 ENCOUNTER — Ambulatory Visit: Admitting: Orthopedic Surgery

## 2023-09-19 ENCOUNTER — Other Ambulatory Visit (INDEPENDENT_AMBULATORY_CARE_PROVIDER_SITE_OTHER): Payer: Self-pay

## 2023-09-19 ENCOUNTER — Encounter: Payer: Self-pay | Admitting: Orthopedic Surgery

## 2023-09-19 ENCOUNTER — Telehealth: Payer: Self-pay | Admitting: Family Medicine

## 2023-09-19 VITALS — BP 132/82 | Ht 62.0 in | Wt 183.0 lb

## 2023-09-19 DIAGNOSIS — G8929 Other chronic pain: Secondary | ICD-10-CM | POA: Diagnosis not present

## 2023-09-19 DIAGNOSIS — M25462 Effusion, left knee: Secondary | ICD-10-CM

## 2023-09-19 DIAGNOSIS — M25562 Pain in left knee: Secondary | ICD-10-CM

## 2023-09-19 MED ORDER — METHYLPREDNISOLONE ACETATE 40 MG/ML IJ SUSP
40.0000 mg | Freq: Once | INTRAMUSCULAR | Status: AC
  Administered 2023-09-19: 40 mg via INTRA_ARTICULAR

## 2023-09-19 MED ORDER — PREDNISONE 10 MG PO TABS: 10.0000 mg | ORAL_TABLET | Freq: Two times a day (BID) | ORAL | 0 refills | Status: DC

## 2023-09-19 NOTE — Progress Notes (Signed)
  Chief Complaint  Patient presents with   Knee Pain    Left     History: 68 year old female pain swelling tightness left knee global pain seems to ache when the weather is cold rainy no treatment  Pain 5 months  H/O HTN HEART DX.   Normal gait pattern LEFT KNEE  Normal skin no erythema  Tenderness medial joint line and peripatellar no lateral tenderness.  Definite effusion  Range of motion normal  Ligaments stable  Meniscal signs normal  Imaging  DG Knee AP/LAT W/Sunrise Left Result Date: 09/19/2023 Imaging studies left knee 3-4 24-month history of pain swelling left knee feeling of tightness X-rays show very slight narrowing of the medial compartment no osteophytes on the left side actually the right knee has more disease But as far as the left knee goes mild OA probably a grade 1 with possible arthritis criteria    Aspiration injection left knee  The knee was confirmed as the site of injection confirming the left knee.  Patient agreed to aspiration injection.  Skin prepped with alcohol and ethyl chloride  Superior lateral patellar approach  18-gauge needle 30 cc clear yellow fluid aspirated  40 mg of Depo-Medrol  with 2 to 3 cc of 1% lidocaine  injected  No complications  Patient started on antiinflammatory medication as well as recommendations for ice every night  Meds ordered this encounter  Medications   methylPREDNISolone  acetate (DEPO-MEDROL ) injection 40 mg   predniSONE  (DELTASONE ) 10 MG tablet    Sig: Take 1 tablet (10 mg total) by mouth 2 (two) times daily with a meal.    Dispense:  42 tablet    Refill:  0

## 2023-09-19 NOTE — Patient Instructions (Signed)
 Ice 30 min every day   Take medication as ordered

## 2023-09-19 NOTE — Progress Notes (Signed)
  Intake history:  BP 132/82 Comment: 09/12/23  Ht 5' 2 (1.575 m)   Wt 183 lb (83 kg)   LMP 07/25/2011   BMI 33.47 kg/m  Body mass index is 33.47 kg/m.    WHAT ARE WE SEEING YOU FOR TODAY?   left knee(s)  How long has this bothered you? (DOI?DOS?WS?)  approximately 5 month(s) ago  Anticoag.  No  Diabetes No  Heart disease Yes  Hypertension Yes  SMOKING HX No  Kidney disease No     Latest Ref Rng & Units 09/12/2023    4:25 PM 04/11/2023    8:29 AM 10/11/2022    3:20 PM  CMP  Glucose 70 - 99 mg/dL 85  889  91   BUN 8 - 27 mg/dL 17  21  25    Creatinine 0.57 - 1.00 mg/dL 9.03  9.11  9.13   Sodium 134 - 144 mmol/L 142  141  141   Potassium 3.5 - 5.2 mmol/L 3.8  3.8  3.6   Chloride 96 - 106 mmol/L 102  100  100   CO2 20 - 29 mmol/L 23  26  22    Calcium  8.7 - 10.3 mg/dL 9.5  89.7  89.7   Total Protein 6.0 - 8.5 g/dL 7.0  7.7  7.5   Total Bilirubin 0.0 - 1.2 mg/dL <9.7  0.3  0.3   Alkaline Phos 44 - 121 IU/L 80  83  91   AST 0 - 40 IU/L 15  17  17    ALT 0 - 32 IU/L 15  16  13       Any ALLERGIES ______________________________________________   Treatment:  Have you taken:  Tylenol Yes  Advil  No  Had PT No  Had injection No  Other  _________________________

## 2023-09-19 NOTE — Telephone Encounter (Signed)
 left detailed message making pt aware that we had to cancel this appt due to change in the diabetic eye exam schedule and advised for her to call back to r/s for earlier time or different day.

## 2023-09-25 ENCOUNTER — Telehealth

## 2023-09-25 ENCOUNTER — Telehealth: Payer: Self-pay

## 2023-09-25 DIAGNOSIS — U071 COVID-19: Secondary | ICD-10-CM | POA: Diagnosis not present

## 2023-09-25 DIAGNOSIS — Z029 Encounter for administrative examinations, unspecified: Secondary | ICD-10-CM

## 2023-09-25 NOTE — Progress Notes (Addendum)
   Virtual Visit via Video Note  I connected with Paula Best on 09/30/23 at  4:00 PM EDT by a video enabled telemedicine application and verified that I am speaking with the correct person using two identifiers.  Patient Location: Home Provider Location: Office/Clinic  I discussed the limitations, risks, security, and privacy concerns of performing an evaluation and management service by video and the availability of in person appointments. I also discussed with the patient that there may be a patient responsible charge related to this service. The patient expressed understanding and agreed to proceed.  Subjective: PCP: Antonetta Rollene BRAVO, MD  Chief Complaint  Patient presents with   Hospitalization Follow-up    Er follow up due to coivd   HPI   ROS: Per HPI  Current Outpatient Medications:    acetaminophen (TYLENOL) 650 MG CR tablet, Take 650 mg by mouth every 8 (eight) hours as needed for pain., Disp: , Rfl:    amLODipine  (NORVASC ) 10 MG tablet, TAKE 1 TABLET(10 MG) BY MOUTH DAILY, Disp: 90 tablet, Rfl: 3   hydrOXYzine  (ATARAX ) 10 MG tablet, Take one tablet at bedtime for sleep, Disp: 30 tablet, Rfl: 3   mupirocin  ointment (BACTROBAN ) 2 %, Apply 1 Application topically 2 (two) times daily. Apply twice daily to sore for 5 days , then as needed, Disp: 30 g, Rfl: 1   predniSONE  (DELTASONE ) 10 MG tablet, Take 1 tablet (10 mg total) by mouth 2 (two) times daily with a meal., Disp: 42 tablet, Rfl: 0   rosuvastatin  (CRESTOR ) 10 MG tablet, Take 1 tablet (10 mg total) by mouth daily., Disp: 30 tablet, Rfl: 5   triamterene -hydrochlorothiazide  (MAXZIDE-25) 37.5-25 MG tablet, Take one and a half tablets by mouth once daily for blood pressure, Disp: 135 tablet, Rfl: 3  Observations/Objective: There were no vitals filed for this visit. Physical Exam  Assessment and Plan: COVID-19  Administrative encounter    Follow Up Instructions: No follow-ups on file.   I discussed the  assessment and treatment plan with the patient. The patient was provided an opportunity to ask questions, and all were answered. The patient agreed with the plan and demonstrated an understanding of the instructions.   The patient was advised to call back or seek an in-person evaluation if the symptoms worsen or if the condition fails to improve as anticipated.  The above assessment and management plan was discussed with the patient. The patient verbalized understanding of and has agreed to the management plan.   Leita Longs, FNP

## 2023-09-25 NOTE — Telephone Encounter (Signed)
 FMLA forms Noted Copied Sleeved Original placed in provider box Copy placed front desk folder  Patient has appointment scheduled 07.02 at 4:00 pm as video needs for for her job

## 2023-10-03 ENCOUNTER — Ambulatory Visit

## 2023-10-07 NOTE — Telephone Encounter (Signed)
 Patient called for an update on her FMLA forms. Contacted [employer] and was told there are still steps that have to be completed but the patient will be notified once everything is completed. Provided update to patient.

## 2023-10-08 ENCOUNTER — Other Ambulatory Visit: Payer: Self-pay | Admitting: Family Medicine

## 2023-10-17 ENCOUNTER — Ambulatory Visit: Payer: Self-pay | Admitting: Family Medicine

## 2023-11-15 ENCOUNTER — Telehealth: Payer: Self-pay

## 2023-11-15 NOTE — Progress Notes (Signed)
 Pharmacy Quality Measure Review  This patient is appearing on a report for being at risk of failing the adherence measure for cholesterol (statin) and diabetes medications this calendar year.   Medication:  Rosuvastatin  10 mg  Last fill date: 07/06/23 for 90 day supply Metformin  500 mg  Last fill date: 07/06/23 for 90 day supply  Left voicemail for patient to return my call at their convenience.  Woodie Jock, PharmD PGY1 Pharmacy Resident

## 2023-11-24 ENCOUNTER — Other Ambulatory Visit: Payer: Self-pay | Admitting: Family Medicine

## 2023-12-02 ENCOUNTER — Telehealth: Payer: Self-pay

## 2023-12-02 NOTE — Progress Notes (Signed)
   12/02/2023  Patient ID: Paula Best, female   DOB: [birth year], 68 y.o.   MRN: [id number]  This patient is appearing on a report for being at risk of failing the adherence measure for identified medications this calendar year.   Medication Adherence Summary (STAR/HEDIS Monitoring): Adherence Category: cholesterol (statin) and diabetes    Drug Name: Rosuvastatin  10 mg  Sold Date:07/13/2023 Days' Supply: 38' --Last LDL 116 mg/dL as of 3/80/7974, which is above goal yet improved.    Drug Name: Metformin  500 mg Sold Date:07/13/2023 Days' Supply: 25 - Previously identified by pharmacy on medication adherence report. However, metformin  is no longer on active medication list and chart review indicates it was discontinued. Last hemoglobin A1c was 6.5 % as of 6/192025.    Notes: ? Adherence data pulled from pharmacy claims portal Dr. Annemarie. ? Patient outreached and unable to leave a message. ? Reviewed barriers to adherence: unknown. ? Plan: MyChart message sent to patient.  Dorcas Solian, PharmD Clinical Pharmacist Cell: [phone]

## 2023-12-02 NOTE — Progress Notes (Signed)
 A user error has taken place: encounter opened in error, closed for administrative reasons.  Dorcas Solian, PharmD Clinical Pharmacist Cell: [phone]

## 2024-01-24 ENCOUNTER — Ambulatory Visit: Payer: Self-pay | Admitting: Family Medicine

## 2024-03-31 ENCOUNTER — Telehealth: Payer: Self-pay | Admitting: Family Medicine

## 2024-03-31 NOTE — Telephone Encounter (Signed)
 Copied from CRM [id number]. Topic: Clinical - Medication Question >> Mar 31, 2024 12:20 PM Olam RAMAN wrote: Reason for CRM: body pain, when it is cold it hurts and would like tylenol for relief   ----------------------------------------------------------------------- From previous Reason for Contact - Scheduling: Patient/patient representative is calling to schedule an appointment. Refer to attachments for appointment information.

## 2024-03-31 NOTE — Telephone Encounter (Unsigned)
 Copied from CRM [id number]. Topic: Clinical - Refused Triage >> Mar 31, 2024 12:17 PM Olam RAMAN wrote: Patient/caller voiced complaints of body pain, when it is cold it hurts. Declined transfer to triage.

## 2024-03-31 NOTE — Telephone Encounter (Signed)
 Scheduled

## 2024-04-02 ENCOUNTER — Encounter: Payer: Self-pay | Admitting: Family Medicine

## 2024-04-02 ENCOUNTER — Other Ambulatory Visit (HOSPITAL_COMMUNITY): Payer: Self-pay | Admitting: Family Medicine

## 2024-04-02 ENCOUNTER — Ambulatory Visit: Payer: Self-pay | Admitting: Family Medicine

## 2024-04-02 VITALS — BP 134/82 | HR 92 | Resp 16 | Ht 62.0 in | Wt 178.1 lb

## 2024-04-02 DIAGNOSIS — I10 Essential (primary) hypertension: Secondary | ICD-10-CM | POA: Diagnosis not present

## 2024-04-02 DIAGNOSIS — E559 Vitamin D deficiency, unspecified: Secondary | ICD-10-CM | POA: Diagnosis not present

## 2024-04-02 DIAGNOSIS — G8929 Other chronic pain: Secondary | ICD-10-CM | POA: Diagnosis not present

## 2024-04-02 DIAGNOSIS — D171 Benign lipomatous neoplasm of skin and subcutaneous tissue of trunk: Secondary | ICD-10-CM

## 2024-04-02 DIAGNOSIS — E1169 Type 2 diabetes mellitus with other specified complication: Secondary | ICD-10-CM

## 2024-04-02 DIAGNOSIS — E66811 Obesity, class 1: Secondary | ICD-10-CM | POA: Diagnosis not present

## 2024-04-02 DIAGNOSIS — Z6832 Body mass index (BMI) 32.0-32.9, adult: Secondary | ICD-10-CM | POA: Diagnosis not present

## 2024-04-02 DIAGNOSIS — Z780 Asymptomatic menopausal state: Secondary | ICD-10-CM | POA: Diagnosis not present

## 2024-04-02 DIAGNOSIS — E7849 Other hyperlipidemia: Secondary | ICD-10-CM | POA: Diagnosis not present

## 2024-04-02 DIAGNOSIS — Z1231 Encounter for screening mammogram for malignant neoplasm of breast: Secondary | ICD-10-CM

## 2024-04-02 DIAGNOSIS — M25562 Pain in left knee: Secondary | ICD-10-CM

## 2024-04-02 DIAGNOSIS — M25561 Pain in right knee: Secondary | ICD-10-CM

## 2024-04-02 MED ORDER — PREDNISONE 10 MG PO TABS: 10.0000 mg | ORAL_TABLET | Freq: Two times a day (BID) | ORAL | 0 refills | Status: AC

## 2024-04-02 MED ORDER — AMLODIPINE BESYLATE 10 MG PO TABS: ORAL_TABLET | ORAL | 3 refills | Status: AC

## 2024-04-02 MED ORDER — KETOROLAC TROMETHAMINE 60 MG/2ML IM SOLN
60.0000 mg | Freq: Once | INTRAMUSCULAR | Status: AC
  Administered 2024-04-02: 60 mg via INTRAMUSCULAR

## 2024-04-02 MED ORDER — METHYLPREDNISOLONE ACETATE 80 MG/ML IJ SUSP
80.0000 mg | Freq: Once | INTRAMUSCULAR | Status: AC
  Administered 2024-04-02: 80 mg via INTRAMUSCULAR

## 2024-04-02 NOTE — Assessment & Plan Note (Signed)
 Recurrent , mid back needs uS 

## 2024-04-02 NOTE — Assessment & Plan Note (Addendum)
 Diabetes associated with hypertension, hyperlipidemia, and arthritis  Paula Best is reminded of the importance of commitment to daily physical activity for 30 minutes or more, as able and the need to limit carbohydrate intake to 30 to 60 grams per meal to help with blood sugar control.   The need to take medication as prescribed, test blood sugar as directed, and to call between visits if there is a concern that blood sugar is uncontrolled is also discussed.   Paula Best is reminded of the importance of daily foot exam, annual eye examination, and good blood sugar, blood pressure and cholesterol control.     Latest Ref Rng & Units 09/12/2023    4:25 PM 04/11/2023    8:29 AM 10/11/2022    3:20 PM 04/06/2022    4:03 PM 10/06/2021    8:58 AM  Diabetic Labs  HbA1c 4.8 - 5.6 % 6.5  6.6  6.4  6.6    Micro/Creat Ratio 0 - 29 mg/g creat 4       Chol 100 - 199 mg/dL 802  766  764  758    HDL >39 mg/dL 60  75  76  71    Calc LDL 0 - 99 mg/dL 883  856  852  852    Triglycerides 0 - 149 mg/dL 883  86  73  869    Creatinine 0.57 - 1.00 mg/dL 9.03  9.11  9.13  9.10  0.80       04/02/2024    2:31 PM 09/19/2023    4:00 PM 09/12/2023    4:09 PM 09/12/2023    3:50 PM 08/15/2023    2:44 PM 08/07/2023    3:20 PM 08/02/2023    6:33 PM  BP/Weight  Systolic BP 163 132 132 143 138 -- 176  Diastolic BP 94 82 82 81 80 -- 76  Wt. (Lbs) 178.08 183  183  178   BMI 32.57 kg/m2 33.47 kg/m2  33.47 kg/m2  32.56 kg/m2       09/12/2023    3:40 PM  Foot/eye exam completion dates  Foot Form Completion Done      Updated lab needed at/ before next visit.

## 2024-04-02 NOTE — Assessment & Plan Note (Signed)
 Update dexa, past due

## 2024-04-02 NOTE — Assessment & Plan Note (Signed)
 Hyperlipidemia:Low fat diet discussed and encouraged.   Lipid Panel  Lab Results  Component Value Date   CHOL 197 09/12/2023   HDL 60 09/12/2023   LDLCALC 116 (H) 09/12/2023   TRIG 116 09/12/2023   CHOLHDL 3.3 09/12/2023     Uncontrolled  Updated lab needed

## 2024-04-02 NOTE — Progress Notes (Signed)
 "  Paula Best     MRN: [id number]      DOB: [birth year]  Chief Complaint  Patient presents with   Generalized Body Aches    Pt complains of generalized body pain that has gotten worse with the colder weather. Pt states it is most painful in her legs, back, and arms. Taking tylenol     HPI Paula Best is here for follow up and re-evaluation of chronic medical conditions, medication management and review of any available recent lab and radiology data.  Preventive health is updated, specifically  Cancer screening and Immunization.   C/o increased and uncontrolled back and knee pain x 2 to 3 weeks, did not go to work this past Mondat due to pain and stiffness. Back pain is non radiaiting C/o recurrent lump/swelling in mid back where she had lipoma removed less than 2 years ago  ROS Denies recent fever or chills. Denies sinus pressure, nasal congestion, ear pain or sore throat. Denies chest congestion, productive cough or wheezing. Denies chest pains, palpitations and leg swelling Denies abdominal pain, nausea, vomiting,diarrhea or constipation.   Denies dysuria, frequency, hesitancy or incontinence.  Denies headaches, seizures, numbness, or tingling. Denies depression, anxiety or insomnia.  PE  BP 134/82   Pulse 92   Resp 16   Ht 5' 2 (1.575 m)   Wt 178 lb 1.3 oz (80.8 kg)   LMP 07/25/2011   SpO2 93%   BMI 32.57 kg/m    Patient alert and oriented and in no cardiopulmonary distress.  HEENT: No facial asymmetry, EOMI,     Neck supple .  Chest: Clear to auscultation bilaterally.  CVS: S1, S2 no murmurs, no S3.Regular rate.  ABD: Soft non tender.   Ext: No edema  FD:izrmzjdzi ROM lumbar spine, adequate in shoulders, hips and reduced in  knees.swelling and crepitus of left knee  Skin: Intact, no ulcerations or rash notedLipoma lower thracic paraspnal area.  Psych: Good eye contact, normal affect. Memory intact not anxious or depressed appearing.  CNS: CN 2-12  intact, power,  normal throughout.no focal deficits noted.   Assessment & Plan  Knee pain, bilateral Uncontrolled . Left worse than right Uncontrolled.Toradol  and depo medrol  administered IM in the office , to be followed by a short course of oral prednisone  .   Lipoma Recurrent , mid back needs uS   Type 2 diabetes mellitus with other specified complication (HCC) Diabetes associated with hypertension, hyperlipidemia, and arthritis  Paula Best is reminded of the importance of commitment to daily physical activity for 30 minutes or more, as able and the need to limit carbohydrate intake to 30 to 60 grams per meal to help with blood sugar control.   The need to take medication as prescribed, test blood sugar as directed, and to call between visits if there is a concern that blood sugar is uncontrolled is also discussed.   Paula Best is reminded of the importance of daily foot exam, annual eye examination, and good blood sugar, blood pressure and cholesterol control.     Latest Ref Rng & Units 09/12/2023    4:25 PM 04/11/2023    8:29 AM 10/11/2022    3:20 PM 04/06/2022    4:03 PM 10/06/2021    8:58 AM  Diabetic Labs  HbA1c 4.8 - 5.6 % 6.5  6.6  6.4  6.6    Micro/Creat Ratio 0 - 29 mg/g creat 4       Chol 100 - 199 mg/dL 802  766  235  241    HDL >39 mg/dL 60  75  76  71    Calc LDL 0 - 99 mg/dL 883  856  852  852    Triglycerides 0 - 149 mg/dL 883  86  73  869    Creatinine 0.57 - 1.00 mg/dL 9.03  9.11  9.13  9.10  0.80       04/02/2024    2:31 PM 09/19/2023    4:00 PM 09/12/2023    4:09 PM 09/12/2023    3:50 PM 08/15/2023    2:44 PM 08/07/2023    3:20 PM 08/02/2023    6:33 PM  BP/Weight  Systolic BP 163 132 132 143 138 -- 176  Diastolic BP 94 82 82 81 80 -- 76  Wt. (Lbs) 178.08 183  183  178   BMI 32.57 kg/m2 33.47 kg/m2  33.47 kg/m2  32.56 kg/m2       09/12/2023    3:40 PM  Foot/eye exam completion dates  Foot Form Completion Done      Updated lab needed at/ before next  visit.  "

## 2024-04-02 NOTE — Patient Instructions (Addendum)
 Annual exam in early August   Please schedule mammogram and bone density at checkout, same day if possible, 01/27or after  Please call and schedule eye exam with Dr darroll on [address] ( [facility])  Toradol  60 mg and Depo medrol  80 mg iM in office for low back and left knee pain, also 5 day course of prednisone  is prescribed  Labs today cBC, lipid, cmp and eGFr, hBA1C , TSH and vit D  You appear to have a recurrent lipoma on your mid back , uS  will be ordered of the area of concern

## 2024-04-02 NOTE — Assessment & Plan Note (Signed)
 Updated lab needed at/ before next visit.

## 2024-04-02 NOTE — Assessment & Plan Note (Signed)
 Uncontrolled . Left worse than right Uncontrolled.Toradol  and depo medrol  administered IM in the office , to be followed by a short course of oral prednisone  .

## 2024-04-02 NOTE — Assessment & Plan Note (Signed)
" °  Patient re-educated about  the importance of commitment to a  minimum of 150 minutes of exercise per week as able.  The importance of healthy food choices with portion control discussed, as well as eating regularly and within a 12 hour window most days. The need to choose clean , green food 50 to 75% of the time is discussed, as well as to make water  the primary drink and set a goal of 64 ounces water  daily.       04/02/2024    2:31 PM 09/19/2023    4:00 PM 09/12/2023    3:50 PM  Weight /BMI  Weight 178 lb 1.3 oz 183 lb 183 lb  Height 5' 2 (1.575 m) 5' 2 (1.575 m) 5' 2 (1.575 m)  BMI 32.57 kg/m2 33.47 kg/m2 33.47 kg/m2    improved "

## 2024-04-02 NOTE — Assessment & Plan Note (Signed)
 Controlled, no change in medication DASH diet and commitment to daily physical activity for a minimum of 30 minutes discussed and encouraged, as a part of hypertension management. The importance of attaining a healthy weight is also discussed.     04/02/2024    2:37 PM 04/02/2024    2:31 PM 09/19/2023    4:00 PM 09/12/2023    4:09 PM 09/12/2023    3:50 PM 08/15/2023    2:44 PM 08/07/2023    3:20 PM  BP/Weight  Systolic BP 134 163 132 132 143 138 --  Diastolic BP 82 94 82 82 81 80 --  Wt. (Lbs)  178.08 183  183  178  BMI  32.57 kg/m2 33.47 kg/m2  33.47 kg/m2  32.56 kg/m2

## 2024-04-03 ENCOUNTER — Other Ambulatory Visit: Payer: Self-pay

## 2024-04-03 ENCOUNTER — Ambulatory Visit: Payer: Self-pay | Admitting: Family Medicine

## 2024-04-03 DIAGNOSIS — E7849 Other hyperlipidemia: Secondary | ICD-10-CM

## 2024-04-03 DIAGNOSIS — I10 Essential (primary) hypertension: Secondary | ICD-10-CM

## 2024-04-03 DIAGNOSIS — E1169 Type 2 diabetes mellitus with other specified complication: Secondary | ICD-10-CM

## 2024-04-03 LAB — CMP14+EGFR
ALT: 13 IU/L (ref 0–32)
AST: 15 IU/L (ref 0–40)
Albumin: 4.7 g/dL (ref 3.9–4.9)
Alkaline Phosphatase: 93 IU/L (ref 49–135)
BUN/Creatinine Ratio: 19 (ref 12–28)
BUN: 17 mg/dL (ref 8–27)
Bilirubin Total: 0.3 mg/dL (ref 0.0–1.2)
CO2: 26 mmol/L (ref 20–29)
Calcium: 10.4 mg/dL — ABNORMAL HIGH (ref 8.7–10.3)
Chloride: 99 mmol/L (ref 96–106)
Creatinine, Ser: 0.88 mg/dL (ref 0.57–1.00)
Globulin, Total: 3.2 g/dL (ref 1.5–4.5)
Glucose: 87 mg/dL (ref 70–99)
Potassium: 4.1 mmol/L (ref 3.5–5.2)
Sodium: 139 mmol/L (ref 134–144)
Total Protein: 7.9 g/dL (ref 6.0–8.5)
eGFR: 72 mL/min/1.73

## 2024-04-03 LAB — CBC WITH DIFFERENTIAL/PLATELET
Basophils Absolute: 0.1 x10E3/uL (ref 0.0–0.2)
Basos: 1 %
EOS (ABSOLUTE): 0.2 x10E3/uL (ref 0.0–0.4)
Eos: 2 %
Hematocrit: 48.2 % — ABNORMAL HIGH (ref 34.0–46.6)
Hemoglobin: 16 g/dL — ABNORMAL HIGH (ref 11.1–15.9)
Immature Grans (Abs): 0 x10E3/uL (ref 0.0–0.1)
Immature Granulocytes: 0 %
Lymphocytes Absolute: 2.9 x10E3/uL (ref 0.7–3.1)
Lymphs: 34 %
MCH: 29.7 pg (ref 26.6–33.0)
MCHC: 33.2 g/dL (ref 31.5–35.7)
MCV: 89 fL (ref 79–97)
Monocytes Absolute: 0.9 x10E3/uL (ref 0.1–0.9)
Monocytes: 11 %
Neutrophils Absolute: 4.5 x10E3/uL (ref 1.4–7.0)
Neutrophils: 52 %
Platelets: 390 x10E3/uL (ref 150–450)
RBC: 5.39 x10E6/uL — ABNORMAL HIGH (ref 3.77–5.28)
RDW: 13.6 % (ref 11.7–15.4)
WBC: 8.5 x10E3/uL (ref 3.4–10.8)

## 2024-04-03 LAB — LIPID PANEL
Chol/HDL Ratio: 3.5 ratio (ref 0.0–4.4)
Cholesterol, Total: 240 mg/dL — ABNORMAL HIGH (ref 100–199)
HDL: 69 mg/dL
LDL Chol Calc (NIH): 150 mg/dL — ABNORMAL HIGH (ref 0–99)
Triglycerides: 121 mg/dL (ref 0–149)
VLDL Cholesterol Cal: 21 mg/dL (ref 5–40)

## 2024-04-03 LAB — VITAMIN D 25 HYDROXY (VIT D DEFICIENCY, FRACTURES): Vit D, 25-Hydroxy: 22.4 ng/mL — ABNORMAL LOW (ref 30.0–100.0)

## 2024-04-03 LAB — HEMOGLOBIN A1C
Est. average glucose Bld gHb Est-mCnc: 146 mg/dL
Hgb A1c MFr Bld: 6.7 % — ABNORMAL HIGH (ref 4.8–5.6)

## 2024-04-03 LAB — TSH: TSH: 1.7 u[IU]/mL (ref 0.450–4.500)

## 2024-04-03 MED ORDER — ROSUVASTATIN CALCIUM 20 MG PO TABS: 20.0000 mg | ORAL_TABLET | Freq: Every day | ORAL | 2 refills | Status: AC

## 2024-04-03 MED ORDER — VITAMIN D (ERGOCALCIFEROL) 1.25 MG (50000 UNIT) PO CAPS: 50000.0000 [IU] | ORAL_CAPSULE | ORAL | 2 refills | Status: AC

## 2024-04-15 ENCOUNTER — Encounter (HOSPITAL_COMMUNITY): Payer: Self-pay

## 2024-04-15 ENCOUNTER — Ambulatory Visit (HOSPITAL_COMMUNITY)
Admission: RE | Admit: 2024-04-15 | Discharge: 2024-04-15 | Disposition: A | Source: Ambulatory Visit | Attending: Family Medicine | Admitting: Family Medicine

## 2024-04-15 ENCOUNTER — Ambulatory Visit (HOSPITAL_COMMUNITY)
Admission: RE | Admit: 2024-04-15 | Discharge: 2024-04-15 | Disposition: A | Discharge status: Home / Self Care | Source: Ambulatory Visit | Attending: Family Medicine | Admitting: Family Medicine

## 2024-04-15 DIAGNOSIS — Z1231 Encounter for screening mammogram for malignant neoplasm of breast: Secondary | ICD-10-CM | POA: Diagnosis present

## 2024-04-15 DIAGNOSIS — Z780 Asymptomatic menopausal state: Secondary | ICD-10-CM | POA: Insufficient documentation

## 2024-08-11 ENCOUNTER — Ambulatory Visit

## 2024-10-27 ENCOUNTER — Encounter: Payer: Self-pay | Admitting: Family Medicine
